# Patient Record
Sex: Female | Born: 1935
Health system: Southern US, Community
[De-identification: ages and names within clinical notes are randomized; demographics above are authoritative.]

## PROBLEM LIST (undated history)

## (undated) DIAGNOSIS — R011 Cardiac murmur, unspecified: Secondary | ICD-10-CM

## (undated) DIAGNOSIS — Z8619 Personal history of other infectious and parasitic diseases: Secondary | ICD-10-CM

## (undated) DIAGNOSIS — Z8612 Personal history of poliomyelitis: Secondary | ICD-10-CM

## (undated) DIAGNOSIS — E119 Type 2 diabetes mellitus without complications: Secondary | ICD-10-CM

## (undated) DIAGNOSIS — Z9710 Presence of artificial limb (complete) (partial), unspecified: Secondary | ICD-10-CM

## (undated) DIAGNOSIS — I10 Essential (primary) hypertension: Secondary | ICD-10-CM

## (undated) DIAGNOSIS — J449 Chronic obstructive pulmonary disease, unspecified: Secondary | ICD-10-CM

## (undated) DIAGNOSIS — H269 Unspecified cataract: Secondary | ICD-10-CM

## (undated) DIAGNOSIS — Z972 Presence of dental prosthetic device (complete) (partial): Secondary | ICD-10-CM

## (undated) DIAGNOSIS — B029 Zoster without complications: Secondary | ICD-10-CM

## (undated) DIAGNOSIS — M1990 Unspecified osteoarthritis, unspecified site: Secondary | ICD-10-CM

## (undated) DIAGNOSIS — T7840XA Allergy, unspecified, initial encounter: Secondary | ICD-10-CM

## (undated) DIAGNOSIS — E785 Hyperlipidemia, unspecified: Secondary | ICD-10-CM

## (undated) DIAGNOSIS — E039 Hypothyroidism, unspecified: Secondary | ICD-10-CM

## (undated) DIAGNOSIS — R42 Dizziness and giddiness: Secondary | ICD-10-CM

## (undated) HISTORY — PX: CARPAL TUNNEL RELEASE: SHX101

## (undated) HISTORY — DX: Hypothyroidism, unspecified: E03.9

## (undated) HISTORY — DX: Zoster without complications: B02.9

## (undated) HISTORY — DX: Unspecified cataract: H26.9

## (undated) HISTORY — DX: Type 2 diabetes mellitus without complications: E11.9

## (undated) HISTORY — PX: ELBOW SURGERY: SHX618

## (undated) HISTORY — DX: Personal history of other infectious and parasitic diseases: Z86.19

## (undated) HISTORY — PX: APPENDECTOMY: SHX54

## (undated) HISTORY — DX: Hyperlipidemia, unspecified: E78.5

## (undated) HISTORY — DX: Allergy, unspecified, initial encounter: T78.40XA

## (undated) HISTORY — DX: Essential (primary) hypertension: I10

## (undated) HISTORY — DX: Unspecified osteoarthritis, unspecified site: M19.90

---

## 1961-07-08 HISTORY — PX: ABDOMINAL HYSTERECTOMY: SHX81

## 1973-07-08 HISTORY — PX: LEG AMPUTATION BELOW KNEE: SHX694

## 2003-10-07 HISTORY — PX: CHOLECYSTECTOMY: SHX55

## 2003-10-14 ENCOUNTER — Other Ambulatory Visit: Payer: Self-pay

## 2005-06-20 ENCOUNTER — Ambulatory Visit: Payer: Self-pay | Admitting: Family Medicine

## 2005-09-09 ENCOUNTER — Ambulatory Visit: Payer: Self-pay | Admitting: Unknown Physician Specialty

## 2005-10-14 ENCOUNTER — Ambulatory Visit: Payer: Self-pay | Admitting: Family Medicine

## 2006-10-06 ENCOUNTER — Ambulatory Visit: Payer: Self-pay | Admitting: Orthopedic Surgery

## 2006-10-10 ENCOUNTER — Ambulatory Visit: Payer: Self-pay | Admitting: Family Medicine

## 2007-02-17 ENCOUNTER — Ambulatory Visit: Payer: Self-pay | Admitting: Orthopedic Surgery

## 2007-05-21 ENCOUNTER — Ambulatory Visit: Payer: Self-pay | Admitting: Anesthesiology

## 2007-06-22 ENCOUNTER — Ambulatory Visit: Payer: Self-pay | Admitting: Anesthesiology

## 2007-07-16 ENCOUNTER — Ambulatory Visit: Payer: Self-pay | Admitting: Family Medicine

## 2007-07-30 ENCOUNTER — Ambulatory Visit: Payer: Self-pay | Admitting: Anesthesiology

## 2007-09-02 ENCOUNTER — Ambulatory Visit: Payer: Self-pay | Admitting: Unknown Physician Specialty

## 2007-09-03 ENCOUNTER — Ambulatory Visit: Payer: Self-pay | Admitting: Anesthesiology

## 2007-10-19 ENCOUNTER — Ambulatory Visit: Payer: Self-pay | Admitting: Unknown Physician Specialty

## 2007-12-17 ENCOUNTER — Ambulatory Visit: Payer: Self-pay | Admitting: Family Medicine

## 2008-04-07 ENCOUNTER — Ambulatory Visit: Payer: Self-pay | Admitting: Orthopedic Surgery

## 2008-04-14 ENCOUNTER — Inpatient Hospital Stay: Payer: Self-pay | Admitting: Orthopedic Surgery

## 2008-07-13 ENCOUNTER — Ambulatory Visit: Payer: Self-pay | Admitting: Family Medicine

## 2009-07-05 ENCOUNTER — Encounter: Admission: RE | Admit: 2009-07-05 | Discharge: 2009-07-05 | Payer: Self-pay | Admitting: Orthopedic Surgery

## 2010-01-05 HISTORY — PX: HAND SURGERY: SHX662

## 2010-10-17 ENCOUNTER — Ambulatory Visit: Payer: Self-pay | Admitting: Family Medicine

## 2011-09-02 DIAGNOSIS — M810 Age-related osteoporosis without current pathological fracture: Secondary | ICD-10-CM | POA: Insufficient documentation

## 2012-09-03 ENCOUNTER — Ambulatory Visit: Payer: Self-pay | Admitting: Family Medicine

## 2012-09-03 LAB — HM MAMMOGRAPHY

## 2012-10-22 ENCOUNTER — Ambulatory Visit: Payer: Self-pay | Admitting: Unknown Physician Specialty

## 2013-08-26 LAB — CBC AND DIFFERENTIAL
HCT: 39 % (ref 36–46)
Hemoglobin: 13.4 g/dL (ref 12.0–16.0)
Platelets: 344 10*3/uL (ref 150–399)
WBC: 8.2 10^3/mL

## 2013-12-16 DIAGNOSIS — J449 Chronic obstructive pulmonary disease, unspecified: Secondary | ICD-10-CM | POA: Insufficient documentation

## 2014-02-10 ENCOUNTER — Ambulatory Visit: Payer: Self-pay | Admitting: Family Medicine

## 2014-07-14 DIAGNOSIS — M65342 Trigger finger, left ring finger: Secondary | ICD-10-CM | POA: Diagnosis not present

## 2014-09-01 DIAGNOSIS — N183 Chronic kidney disease, stage 3 (moderate): Secondary | ICD-10-CM | POA: Diagnosis not present

## 2014-09-01 DIAGNOSIS — I10 Essential (primary) hypertension: Secondary | ICD-10-CM | POA: Diagnosis not present

## 2014-09-01 DIAGNOSIS — E039 Hypothyroidism, unspecified: Secondary | ICD-10-CM | POA: Diagnosis not present

## 2014-09-01 DIAGNOSIS — E785 Hyperlipidemia, unspecified: Secondary | ICD-10-CM | POA: Diagnosis not present

## 2014-09-01 DIAGNOSIS — E559 Vitamin D deficiency, unspecified: Secondary | ICD-10-CM | POA: Diagnosis not present

## 2014-09-01 DIAGNOSIS — E1129 Type 2 diabetes mellitus with other diabetic kidney complication: Secondary | ICD-10-CM | POA: Diagnosis not present

## 2014-09-01 LAB — BASIC METABOLIC PANEL
BUN: 21 mg/dL (ref 4–21)
Creatinine: 1.2 mg/dL — AB (ref 0.5–1.1)
Glucose: 129 mg/dL
Potassium: 3.6 mmol/L (ref 3.4–5.3)
Sodium: 141 mmol/L (ref 137–147)

## 2014-09-01 LAB — TSH: TSH: 1.39 u[IU]/mL (ref 0.41–5.90)

## 2014-09-01 LAB — HEMOGLOBIN A1C: Hgb A1c MFr Bld: 6.5 % — AB (ref 4.0–6.0)

## 2014-09-01 LAB — LIPID PANEL
Cholesterol: 176 mg/dL (ref 0–200)
HDL: 61 mg/dL (ref 35–70)
LDL Cholesterol: 72 mg/dL
Triglycerides: 217 mg/dL — AB (ref 40–160)

## 2014-09-12 DIAGNOSIS — M1990 Unspecified osteoarthritis, unspecified site: Secondary | ICD-10-CM | POA: Diagnosis not present

## 2014-10-20 DIAGNOSIS — J449 Chronic obstructive pulmonary disease, unspecified: Secondary | ICD-10-CM | POA: Diagnosis not present

## 2014-10-20 DIAGNOSIS — J310 Chronic rhinitis: Secondary | ICD-10-CM | POA: Diagnosis not present

## 2014-10-20 DIAGNOSIS — E6609 Other obesity due to excess calories: Secondary | ICD-10-CM | POA: Diagnosis not present

## 2014-10-20 DIAGNOSIS — R0609 Other forms of dyspnea: Secondary | ICD-10-CM | POA: Diagnosis not present

## 2014-10-20 DIAGNOSIS — R0602 Shortness of breath: Secondary | ICD-10-CM | POA: Diagnosis not present

## 2014-11-03 DIAGNOSIS — M7551 Bursitis of right shoulder: Secondary | ICD-10-CM | POA: Diagnosis not present

## 2014-11-24 DIAGNOSIS — E785 Hyperlipidemia, unspecified: Secondary | ICD-10-CM | POA: Insufficient documentation

## 2014-11-24 DIAGNOSIS — I10 Essential (primary) hypertension: Secondary | ICD-10-CM | POA: Insufficient documentation

## 2014-11-24 DIAGNOSIS — E559 Vitamin D deficiency, unspecified: Secondary | ICD-10-CM | POA: Insufficient documentation

## 2014-11-24 DIAGNOSIS — L309 Dermatitis, unspecified: Secondary | ICD-10-CM | POA: Insufficient documentation

## 2014-11-24 DIAGNOSIS — E1129 Type 2 diabetes mellitus with other diabetic kidney complication: Secondary | ICD-10-CM | POA: Insufficient documentation

## 2014-11-24 DIAGNOSIS — J984 Other disorders of lung: Secondary | ICD-10-CM | POA: Insufficient documentation

## 2014-11-24 DIAGNOSIS — N183 Chronic kidney disease, stage 3 unspecified: Secondary | ICD-10-CM | POA: Insufficient documentation

## 2014-11-24 DIAGNOSIS — M1990 Unspecified osteoarthritis, unspecified site: Secondary | ICD-10-CM | POA: Insufficient documentation

## 2014-11-24 DIAGNOSIS — Z7409 Other reduced mobility: Secondary | ICD-10-CM | POA: Insufficient documentation

## 2014-11-24 DIAGNOSIS — Q6689 Other  specified congenital deformities of feet: Secondary | ICD-10-CM | POA: Insufficient documentation

## 2014-11-24 DIAGNOSIS — Z89519 Acquired absence of unspecified leg below knee: Secondary | ICD-10-CM | POA: Insufficient documentation

## 2014-11-24 DIAGNOSIS — G2581 Restless legs syndrome: Secondary | ICD-10-CM | POA: Insufficient documentation

## 2014-11-24 DIAGNOSIS — Z8612 Personal history of poliomyelitis: Secondary | ICD-10-CM | POA: Insufficient documentation

## 2014-11-24 DIAGNOSIS — M419 Scoliosis, unspecified: Secondary | ICD-10-CM | POA: Insufficient documentation

## 2014-11-24 DIAGNOSIS — M25559 Pain in unspecified hip: Secondary | ICD-10-CM | POA: Insufficient documentation

## 2014-11-24 DIAGNOSIS — K589 Irritable bowel syndrome without diarrhea: Secondary | ICD-10-CM | POA: Insufficient documentation

## 2014-11-24 DIAGNOSIS — K219 Gastro-esophageal reflux disease without esophagitis: Secondary | ICD-10-CM | POA: Insufficient documentation

## 2014-11-24 DIAGNOSIS — E039 Hypothyroidism, unspecified: Secondary | ICD-10-CM | POA: Insufficient documentation

## 2014-11-24 HISTORY — DX: Personal history of poliomyelitis: Z86.12

## 2014-11-28 DIAGNOSIS — H2513 Age-related nuclear cataract, bilateral: Secondary | ICD-10-CM | POA: Diagnosis not present

## 2014-12-19 DIAGNOSIS — J449 Chronic obstructive pulmonary disease, unspecified: Secondary | ICD-10-CM | POA: Diagnosis not present

## 2015-01-02 ENCOUNTER — Encounter: Payer: Self-pay | Admitting: Family Medicine

## 2015-01-02 ENCOUNTER — Ambulatory Visit (INDEPENDENT_AMBULATORY_CARE_PROVIDER_SITE_OTHER): Payer: Commercial Managed Care - HMO | Admitting: Family Medicine

## 2015-01-02 VITALS — BP 108/62 | HR 62 | Temp 97.6°F | Resp 16 | Ht 69.0 in | Wt 159.0 lb

## 2015-01-02 DIAGNOSIS — Z0000 Encounter for general adult medical examination without abnormal findings: Secondary | ICD-10-CM | POA: Diagnosis not present

## 2015-01-02 DIAGNOSIS — E039 Hypothyroidism, unspecified: Secondary | ICD-10-CM | POA: Diagnosis not present

## 2015-01-02 DIAGNOSIS — E785 Hyperlipidemia, unspecified: Secondary | ICD-10-CM | POA: Diagnosis not present

## 2015-01-02 DIAGNOSIS — M129 Arthropathy, unspecified: Secondary | ICD-10-CM

## 2015-01-02 DIAGNOSIS — M810 Age-related osteoporosis without current pathological fracture: Secondary | ICD-10-CM | POA: Diagnosis not present

## 2015-01-02 DIAGNOSIS — E1129 Type 2 diabetes mellitus with other diabetic kidney complication: Secondary | ICD-10-CM | POA: Diagnosis not present

## 2015-01-02 DIAGNOSIS — J449 Chronic obstructive pulmonary disease, unspecified: Secondary | ICD-10-CM

## 2015-01-02 DIAGNOSIS — N183 Chronic kidney disease, stage 3 unspecified: Secondary | ICD-10-CM

## 2015-01-02 DIAGNOSIS — K219 Gastro-esophageal reflux disease without esophagitis: Secondary | ICD-10-CM

## 2015-01-02 DIAGNOSIS — M19011 Primary osteoarthritis, right shoulder: Secondary | ICD-10-CM

## 2015-01-02 DIAGNOSIS — I10 Essential (primary) hypertension: Secondary | ICD-10-CM

## 2015-01-02 DIAGNOSIS — E559 Vitamin D deficiency, unspecified: Secondary | ICD-10-CM | POA: Diagnosis not present

## 2015-01-02 MED ORDER — GLUCOSE BLOOD VI STRP: ORAL_STRIP | Status: DC

## 2015-01-02 NOTE — Progress Notes (Addendum)
Patient: Kayla Boyle, Female    DOB: May 21, 1935, 80 y.o.   MRN: [id number] Visit Date: 01/02/2015  Today's Provider: Mila Merry, MD   Chief Complaint  Patient presents with  . Annual Exam   Subjective:    Annual wellness visit Kayla Boyle is a 80 y.o. female who presents today for her Subsequent Annual Wellness Visit. She feels fairly well. She reports exercising none. She reports she is sleeping fairly well.  ----------------------------------------------------------- Pt reports that she thinks her thyroid may be off, because she reporting taking 2 of her Levothyroxine one day and her appetite was decrease and when she takes one she feels like she eats all day long.    Diabetes Mellitus Type II, Follow-up:   Lab Results  Component Value Date   HGBA1C 6.5* 09/01/2014   Last seen for diabetes 3 months ago.  Management changes included none. She reports good compliance with treatment. She is not having side effects.  Home blood sugar records: fasting range: 120s-140s  Episodes of hypoglycemia? no    ------------------------------------------------------------------------   Hypertension, follow-up:  BP Readings from Last 3 Encounters:  01/02/15 108/62  09/01/14 114/60    She was last seen for hypertension 3 months ago.  BP at that visit was 114/60 . Management changes since that visit include none. She reports good compliance with treatment. She is not having side effects.  She is not exercising. She is adherent to low salt diet.   Outside blood pressures are normal.  ------------------------------------------------------------------------    Lipid/Cholesterol, Follow-up:   Last seen for this 3 months ago.  Management changes since that visit include none.  Last Lipid Panel:    Component Value Date/Time   CHOL 176 09/01/2014   TRIG 217* 09/01/2014   HDL 61 09/01/2014   LDLCALC 72 09/01/2014    She reports good compliance with  treatment. She is not having side effects.   Wt Readings from Last 3 Encounters:  01/02/15 159 lb (72.122 kg)  09/01/14 162 lb (73.483 kg)    ------------------------------------------------------------------------    GERD, Follow up:  She is having heartburn every day even on Protonix and ranitidine.   She reports good compliance with treatment. She is not having side effects. .  She IS experiencing difficulty swallowing and heartburn. She is NOT experiencing abdominal bloating, choking on food, hematemesis, hoarseness, midespigastric pain, nausea or shortness of breath  ------------------------------------------------------------------------     Review of Systems  Constitutional: Negative.   HENT: Positive for sinus pressure, sneezing and trouble swallowing.   Eyes: Negative.   Respiratory: Positive for shortness of breath and wheezing.   Cardiovascular: Negative.   Gastrointestinal: Positive for diarrhea.  Endocrine: Negative.   Genitourinary: Negative.   Musculoskeletal: Positive for back pain.  Skin: Negative.   Allergic/Immunologic: Negative.   Neurological: Negative.   Hematological: Negative.   Psychiatric/Behavioral: Negative.     History   Social History  . Marital Status: Single    Spouse Name: N/A  . Number of Children: N/A  . Years of Education: 12   Occupational History  . Disabled   . retired    Social History Main Topics  . Smoking status: Former Smoker    Types: Cigarettes    Quit date: 10/06/2001  . Smokeless tobacco: Not on file  . Alcohol Use: No  . Drug Use: No  . Sexual Activity: Not on file   Other Topics Concern  . Not on file   Social History  Narrative    Patient Active Problem List   Diagnosis Date Noted  . Arthritis 11/24/2014  . Chronic kidney disease (CKD), stage III (moderate) 11/24/2014  . Club foot 11/24/2014  . Diabetes mellitus with renal complications 11/24/2014  . Eczema of hand 11/24/2014  . GERD  (gastroesophageal reflux disease) 11/24/2014  . Arthralgia of hip 11/24/2014  . H/O acute poliomyelitis 11/24/2014  . Hyperlipidemia 11/24/2014  . Hypertension 11/24/2014  . Hypomagnesemia 11/24/2014  . Hypothyroid 11/24/2014  . IBS (irritable bowel syndrome) 11/24/2014  . Mobility impaired 11/24/2014  . Restless leg 11/24/2014  . Restrictive lung disease 11/24/2014  . Status post below knee amputation 11/24/2014  . Scoliosis 11/24/2014  . Vitamin D deficiency 11/24/2014  . COPD, moderate 12/16/2013  . OP (osteoporosis) 09/02/2011    Past Surgical History  Procedure Laterality Date  . Cholecystectomy  10/2003    laproscopic  . Leg amputation below knee Right 1975    traumatic; bleow knee and club foot  . Abdominal hysterectomy  1963  . Appendectomy    . Elbow surgery Bilateral   . Hand surgery  01/2010  . Carpal tunnel release      Her family history includes Cancer in her son; Cancer (age of onset: 7) in her other; Diabetes in her brother; HIV/AIDS in her son; Hepatitis C in her daughter; Pancreatic cancer in her father; Prostate cancer in her brother.    Previous Medications   ALBUTEROL (PROVENTIL HFA;VENTOLIN HFA) 108 (90 BASE) MCG/ACT INHALER    Inhale 2 puffs into the lungs every 4 (four) hours as needed.   ALENDRONATE (FOSAMAX) 70 MG TABLET    Take 1 tablet by mouth once a week. 30 minutes before first food, beverage   ASPIRIN 81 MG TABLET    Take 1 tablet by mouth daily.   ATENOLOL-CHLORTHALIDONE (TENORETIC) 50-25 MG PER TABLET    Take 1 tablet by mouth daily.   FLUTICASONE (FLONASE) 50 MCG/ACT NASAL SPRAY    Place 2 sprays into both nostrils daily.   GLUCOSE BLOOD TEST STRIP    1 strip daily. Check blood sugar once daily   HYDROCODONE-ACETAMINOPHEN (NORCO/VICODIN) 5-325 MG PER TABLET    Take 1 tablet by mouth every 6 (six) hours as needed.   LEVOTHYROXINE (SYNTHROID, LEVOTHROID) 75 MCG TABLET    Take 1 tablet by mouth daily.   LOVASTATIN (MEVACOR) 40 MG TABLET     Take 1 tablet by mouth at bedtime.   MAGNESIUM OXIDE 500 MG TABS    Take 1 tablet by mouth daily.   METFORMIN (GLUCOPHAGE-XR) 500 MG 24 HR TABLET    Take 1 tablet by mouth daily.   MONTELUKAST (SINGULAIR) 10 MG TABLET    Take 1 tablet by mouth daily.   PANTOPRAZOLE (PROTONIX) 40 MG TABLET    Take 1 tablet by mouth daily.   RANITIDINE (ZANTAC) 300 MG TABLET    Take 1 tablet by mouth daily as needed.   TRIAMCINOLONE LOTION (KENALOG) 0.1 %    Apply 1 application topically 2 (two) times daily.   VITAMIN D, CHOLECALCIFEROL, 400 UNITS CHEW    Chew 1 tablet by mouth daily.    Patient Care Team: Malva Limes, MD as PCP - General (Family Medicine) Mertie Moores, MD as Referring Physician (Pulmonary Disease) Kandyce Rud., MD as Consulting Physician (Rheumatology) Scot Jun, MD as Consulting Physician (Gastroenterology) Ollen Gross, MD as Consulting Physician (Orthopedic Surgery)     Objective:   Vitals: BP 108/62 mmHg  Pulse 62  Temp(Src) 97.6 F (36.4 C) (Oral)  Resp 16  Ht 5\' 9"  (1.753 m)  Wt 159 lb (72.122 kg)  BMI 23.47 kg/m2  Physical Exam   General Appearance:    Alert, cooperative, no distress, appears stated age, obese  Head:    Normocephalic, without obvious abnormality, atraumatic  Eyes:    PERRL, conjunctiva/corneas clear, EOM's intact, fundi    benign, both eyes  Ears:    Normal TM's and external ear canals, both ears  Nose:   Nares normal, septum midline, mucosa normal, no drainage    or sinus tenderness  Throat:   Lips, mucosa, and tongue normal; teeth and gums normal  Neck:   Supple, symmetrical, trachea midline, no adenopathy;    thyroid:  no enlargement/tenderness/nodules; no carotid   bruit or JVD  Back:     Symmetric, no curvature, ROM normal, no CVA tenderness  Lungs:     Clear to auscultation bilaterally, respirations unlabored  Chest Wall:    No tenderness or deformity   Heart:    Regular rate and rhythm, S1 and S2 normal, no murmur,  rub   or gallop  Breast Exam:    deferred  Abdomen:     Soft, non-tender, bowel sounds active all four quadrants,    no masses, no organomegaly  Pevic:    deferred  Extremities:   Extremities normal, atraumatic, no cyanosis or edema. Status post right below knee amputation with prosthetic right lower leg.   Pulses:   2+ and symmetric all extremities  Skin:   Skin color, texture, turgor normal, no rashes or lesions  Lymph nodes:   Cervical, supraclavicular, and axillary nodes normal  Neurologic:   CNII-XII intact, normal strength, sensation and reflexes    throughout     Activities of Daily Living In your present state of health, do you have any difficulty performing the following activities: 01/02/2015 01/02/2015  Hearing? N N  Vision? N N  Difficulty concentrating or making decisions? N N  Walking or climbing stairs? Y Y  Dressing or bathing? N N  Doing errands, shopping? N N    Fall Risk Assessment Fall Risk  01/02/2015  Falls in the past year? No     Depression Screen PHQ 2/9 Scores 01/02/2015  PHQ - 2 Score 0    Cognitive Testing - 6-CIT  Correct? Score   What year is it? yes 0 0 or 4  What month is it? yes 0 0 or 3  Memorize:    Floyde Parkins[place],  [place],  44,  [address],  [place],      What time is it? (within 1 hour) yes 0 0 or 3  Count backwards from 20 yes 0 0, 2, or 4  Name the months of the year yes 0 0, 2, or 4  Repeat name & address above yes 0 0, 2, 4, 6, 8, or 10       TOTAL SCORE  0/28   Interpretation:  Normal  Normal (0-7) Abnormal (8-28)   Audit-C Alcohol Use Screening  Question Answer Points  How often do you have alcoholic drink? never 0  How many drinks do you typically consume in a day? 0 0  How oftey will you drink 6 or more in a total? never 0  Total Score:  0   A score of 3 or more in women, and 4 or more in men indicates increased risk for alcohol abuse, EXCEPT if all of the points are  from question 1.      Assessment & Plan:     Annual  Wellness Visit  Reviewed patient's Family Medical History Reviewed and updated list of patient's medical providers Assessment of cognitive impairment was done Assessed patient's functional ability Established a written schedule for health screening services Health Risk Assessent Completed and Reviewed  Exercise Activities and Dietary recommendations Goals    None      Immunization History  Administered Date(s) Administered  . Pneumococcal Conjugate-13 08/26/2013  . Pneumococcal Polysaccharide-23 06/13/2005  . Td 02/24/2008  . Tdap 03/26/2011  . Zoster 07/19/2010    Health Maintenance  Topic Date Due  . FOOT EXAM  07/12/1946  . OPHTHALMOLOGY EXAM  07/12/1946  . URINE MICROALBUMIN  07/12/1946  . COLONOSCOPY  07/12/1986  . INFLUENZA VACCINE  02/06/2015  . HEMOGLOBIN A1C  03/02/2015  . TETANUS/TDAP  03/25/2021  . DEXA SCAN  Completed  . ZOSTAVAX  Completed  . PNA vac Low Risk Adult  Completed      Discussed health benefits of physical activity, and encouraged her to engage in regular exercise appropriate for her age and condition.    ------------------------------------------------------------------------------------------------------------ 1. Annual physical exam   2. COPD, moderate Stable continue routine follow up Q92months Dr. Meredeth Ide  3. Chronic kidney disease (CKD), stage III (moderate) Check renal panel. Consider adding ACEI or ARB.   4. Type 2 diabetes mellitus with other diabetic kidney complication Doing well current medications.  - Hemoglobin A1c  5. Gastroesophageal reflux disease, esophagitis presence not specified Has been having more reflux sx lately I suggested she see her GI. She states it has just been bothering more the last few weeks. She is going to see if she can get it under control with dietary changes and call back if not.   6. Hyperlipidemia She is tolerating lovastatin well with no adverse effects.   Time to check lipids.  - Lipid  panel - Hepatic function panel  7. Essential hypertension BP has been running low. May be able to get by without hctz. Check renal first.  - Renal function panel - EKG 12-Lead  8. Hypomagnesemia Levels normal when checked in February  9. OP (osteoporosis) Continue alendronate. BMD next year  10. Vitamin D deficiency Continue current supplementation.   11. Hypothyroidism, unspecified hypothyroidism type  - TSH  12. Arthritis of shoulder region, right  Continue folllow up Dr. Katrinka Blazing injections of right shoulder.   13. S/p Right below knee amputation with prosthetic right lower leg.

## 2015-01-03 LAB — LIPID PANEL
Chol/HDL Ratio: 2.9 ratio units (ref 0.0–4.4)
Cholesterol, Total: 171 mg/dL (ref 100–199)
HDL: 58 mg/dL (ref >39)
LDL Calculated: 79 mg/dL (ref 0–99)
Triglycerides: 168 mg/dL — ABNORMAL HIGH (ref 0–149)
VLDL Cholesterol Cal: 34 mg/dL (ref 5–40)

## 2015-01-03 LAB — RENAL FUNCTION PANEL
Albumin: 4.2 g/dL (ref 3.5–4.8)
BUN/Creatinine Ratio: 19 (ref 11–26)
BUN: 21 mg/dL (ref 8–27)
CO2: 25 mmol/L (ref 18–29)
Calcium: 9.3 mg/dL (ref 8.7–10.3)
Chloride: 95 mmol/L — ABNORMAL LOW (ref 97–108)
Creatinine, Ser: 1.1 mg/dL — ABNORMAL HIGH (ref 0.57–1.00)
GFR calc Af Amer: 56 mL/min/{1.73_m2} — ABNORMAL LOW (ref >59)
GFR calc non Af Amer: 48 mL/min/{1.73_m2} — ABNORMAL LOW (ref >59)
Glucose: 109 mg/dL — ABNORMAL HIGH (ref 65–99)
Phosphorus: 2.7 mg/dL (ref 2.5–4.5)
Potassium: 3.5 mmol/L (ref 3.5–5.2)
Sodium: 138 mmol/L (ref 134–144)

## 2015-01-03 LAB — HEPATIC FUNCTION PANEL
ALT: 18 IU/L (ref 0–32)
AST: 22 IU/L (ref 0–40)
Alkaline Phosphatase: 65 IU/L (ref 39–117)
Bilirubin Total: 0.6 mg/dL (ref 0.0–1.2)
Bilirubin, Direct: 0.16 mg/dL (ref 0.00–0.40)
Total Protein: 6.6 g/dL (ref 6.0–8.5)

## 2015-01-03 LAB — HEMOGLOBIN A1C
Est. average glucose Bld gHb Est-mCnc: 134 mg/dL
Hgb A1c MFr Bld: 6.3 % — ABNORMAL HIGH (ref 4.8–5.6)

## 2015-01-03 LAB — TSH: TSH: 0.491 u[IU]/mL (ref 0.450–4.500)

## 2015-01-03 MED ORDER — LOSARTAN POTASSIUM 50 MG PO TABS: 50.0000 mg | ORAL_TABLET | Freq: Every day | ORAL | Status: DC

## 2015-01-03 NOTE — Addendum Note (Signed)
Addended by: Cyndia BentBYRD, Mikele Sifuentes M on: 01/03/2015 09:26 AM   Modules accepted: Orders

## 2015-01-12 DIAGNOSIS — M65342 Trigger finger, left ring finger: Secondary | ICD-10-CM | POA: Diagnosis not present

## 2015-01-12 DIAGNOSIS — M7551 Bursitis of right shoulder: Secondary | ICD-10-CM | POA: Diagnosis not present

## 2015-01-13 ENCOUNTER — Telehealth: Payer: Self-pay | Admitting: Family Medicine

## 2015-01-26 ENCOUNTER — Other Ambulatory Visit: Payer: Self-pay | Admitting: Family Medicine

## 2015-01-26 DIAGNOSIS — M65342 Trigger finger, left ring finger: Secondary | ICD-10-CM | POA: Diagnosis not present

## 2015-02-09 ENCOUNTER — Ambulatory Visit (INDEPENDENT_AMBULATORY_CARE_PROVIDER_SITE_OTHER): Payer: Commercial Managed Care - HMO | Admitting: Family Medicine

## 2015-02-09 ENCOUNTER — Encounter: Payer: Self-pay | Admitting: Family Medicine

## 2015-02-09 VITALS — BP 100/58 | HR 70 | Temp 98.0°F | Resp 16

## 2015-02-09 DIAGNOSIS — N183 Chronic kidney disease, stage 3 unspecified: Secondary | ICD-10-CM

## 2015-02-09 DIAGNOSIS — I10 Essential (primary) hypertension: Secondary | ICD-10-CM

## 2015-02-09 DIAGNOSIS — J309 Allergic rhinitis, unspecified: Secondary | ICD-10-CM | POA: Diagnosis not present

## 2015-02-09 MED ORDER — LISINOPRIL 2.5 MG PO TABS: 2.5000 mg | ORAL_TABLET | Freq: Every day | ORAL | Status: DC

## 2015-02-09 MED ORDER — FLUTICASONE PROPIONATE 50 MCG/ACT NA SUSP: 2.0000 | Freq: Every day | NASAL | Status: DC

## 2015-02-09 MED ORDER — ATENOLOL-CHLORTHALIDONE 50-25 MG PO TABS: ORAL_TABLET | ORAL | Status: DC

## 2015-02-09 NOTE — Patient Instructions (Signed)
Reduce atenolol-chlorthalidone to 1/2 tablet daily until next office visit Start lisinopril once a day.

## 2015-02-09 NOTE — Progress Notes (Signed)
Patient: Kayla Boyle Female    DOB: [birth year]   80 y.o.   MRN: [id number] Visit Date: 02/09/2015  Today's Provider: Mila Merry, MD   Chief Complaint  Patient presents with  . Follow-up  . Hypertension   Subjective:    HPI      Hypertension, follow-up:  BP Readings from Last 3 Encounters:  02/09/15 100/58  01/02/15 108/62  09/01/14 114/60    She was last seen for hypertension 1 months ago.  BP at that visit was 108/62. Management changes since that visit include; reduce atenolol-chlorthalidone down to 1/2 tablet daily, and start Losartan 50mg  one tablet daily due to diabetes and CKD.  She stopped taking losartan because she thought it made her feel bloated, and has gone back up to a full tablet of atenolol-chlorthalidone daily.     Weight trend: stable Wt Readings from Last 3 Encounters:  01/02/15 159 lb (72.122 kg)  09/01/14 162 lb (73.483 kg)     ------------------------------------------------------------------------  Allergies She states her allergies have been much worse and she requests refill for Flonase which has worked well in the pst.   Allergies  Allergen Reactions  . Bee Venom Hives  . Codeine Rash and Swelling  . Penicillins Rash and Swelling   Previous Medications   ALBUTEROL (PROVENTIL HFA;VENTOLIN HFA) 108 (90 BASE) MCG/ACT INHALER    Inhale 2 puffs into the lungs every 4 (four) hours as needed.   ALENDRONATE (FOSAMAX) 70 MG TABLET    Take 1 tablet by mouth once a week. 30 minutes before first food, beverage   ASPIRIN 81 MG TABLET    Take 1 tablet by mouth daily.   ATENOLOL-CHLORTHALIDONE (TENORETIC) 50-25 MG PER TABLET    TAKE 1/2 TABLET BY MOUTH EACH DAY.   FLUTICASONE (FLONASE) 50 MCG/ACT NASAL SPRAY    Place 2 sprays into both nostrils daily.   GLUCOSE BLOOD (ACCU-CHEK AVIVA PLUS) TEST STRIP    Use to check blood sugar once a day   HYDROCODONE-ACETAMINOPHEN (NORCO/VICODIN) 5-325 MG PER TABLET    Take 1 tablet by mouth every 6  (six) hours as needed.   LEVOTHYROXINE (SYNTHROID, LEVOTHROID) 75 MCG TABLET    Take 1 tablet by mouth daily.   LOSARTAN (COZAAR) 50 MG TABLET   Not taking   LOVASTATIN (MEVACOR) 40 MG TABLET    Take 1 tablet by mouth at bedtime.   MAGNESIUM OXIDE 500 MG TABS    Take 1 tablet by mouth daily.   METFORMIN (GLUCOPHAGE-XR) 500 MG 24 HR TABLET    Take 1 tablet by mouth daily.   MONTELUKAST (SINGULAIR) 10 MG TABLET    Take 1 tablet by mouth daily.   PANTOPRAZOLE (PROTONIX) 40 MG TABLET    TAKE ONE TABLET BY MOUTH EVERY DAY   RANITIDINE (ZANTAC) 300 MG TABLET    Take 1 tablet by mouth daily as needed.   TRIAMCINOLONE LOTION (KENALOG) 0.1 %    Apply 1 application topically 2 (two) times daily.   VITAMIN D, CHOLECALCIFEROL, 400 UNITS CHEW    Chew 1 tablet by mouth daily.    Review of Systems  Constitutional: Positive for appetite change.  Cardiovascular: Negative for chest pain and palpitations.  Musculoskeletal: Positive for gait problem.  Neurological: Negative for dizziness and light-headedness.    History  Substance Use Topics  . Smoking status: Former Smoker    Types: Cigarettes    Quit date: 10/06/2001  . Smokeless tobacco: Not on file  .  Alcohol Use: No   Objective:   BP 100/58 mmHg  Pulse 70  Temp(Src) 98 F (36.7 C) (Oral)  Resp 16  SpO2 93%  Physical Exam  General Appearance:    Alert, cooperative, no distress  Eyes:    PERRL, conjunctiva/corneas clear, EOM's intact       Lungs:     Clear to auscultation bilaterally, respirations unlabored  Heart:    Regular rate and rhythm  Neurologic:   Awake, alert, oriented x 3. No apparent focal neurological           defect.            Assessment & Plan:     1. Allergic rhinitis, unspecified allergic rhinitis type Restart nasal steroid - fluticasone (FLONASE) 50 MCG/ACT nasal spray; Place 2 sprays into both nostrils daily.  Dispense: 16 g; Refill: 4  2. Essential hypertension Transition her to ACEI since she doesn't think  she tolerated losartin.  - lisinopril (PRINIVIL,ZESTRIL) 2.5 MG tablet; Take 1 tablet (2.5 mg total) by mouth daily.  Dispense: 30 tablet; Refill: 1 - atenolol-chlorthalidone (TENORETIC) 50-25 MG per tablet; 1/2 tablet daily  Dispense: 1 tablet; Refill: 4  3. Chronic kidney disease (CKD), stage III (moderate) Push fluids. Recheck renal functions after starting ACEI,        Mila Merry, MD  [facility]

## 2015-02-10 ENCOUNTER — Telehealth: Payer: Self-pay | Admitting: Family Medicine

## 2015-02-10 NOTE — Telephone Encounter (Signed)
Richardson Dopp called saying Marcelino Duster was to fax a letter to her that Dr. Sherrie Mustache wrote.  The fax number is [phone].  Thanks Barth Kirks

## 2015-02-13 NOTE — Telephone Encounter (Signed)
Pt called to see if they letter was ready and if it had been faxed. Pt request that [place] call her back. Thanks TNP

## 2015-02-13 NOTE — Telephone Encounter (Signed)
Please advise letter pt requested?

## 2015-02-14 NOTE — Telephone Encounter (Signed)
Pt called back to speak with Marcelino Duster. Pt stated she needed to speak with Marcelino Duster before she leaves this afternoon. Pt stated that she needed the letter and she was trying to help [place] out by getting [place] the fax#. Thanks TNP

## 2015-02-15 NOTE — Addendum Note (Signed)
Addended by: Malva Limes on: 02/15/2015 08:08 AM   Modules accepted: Kipp Brood

## 2015-02-23 ENCOUNTER — Other Ambulatory Visit: Payer: Self-pay | Admitting: Family Medicine

## 2015-03-02 DIAGNOSIS — Z89511 Acquired absence of right leg below knee: Secondary | ICD-10-CM | POA: Diagnosis not present

## 2015-03-23 ENCOUNTER — Ambulatory Visit (INDEPENDENT_AMBULATORY_CARE_PROVIDER_SITE_OTHER): Payer: Commercial Managed Care - HMO | Admitting: Family Medicine

## 2015-03-23 ENCOUNTER — Other Ambulatory Visit: Payer: Self-pay | Admitting: Family Medicine

## 2015-03-23 ENCOUNTER — Encounter: Payer: Self-pay | Admitting: Family Medicine

## 2015-03-23 VITALS — BP 106/62 | HR 68 | Temp 97.8°F | Resp 16

## 2015-03-23 DIAGNOSIS — I10 Essential (primary) hypertension: Secondary | ICD-10-CM

## 2015-03-23 DIAGNOSIS — Z23 Encounter for immunization: Secondary | ICD-10-CM

## 2015-03-23 DIAGNOSIS — J309 Allergic rhinitis, unspecified: Secondary | ICD-10-CM | POA: Diagnosis not present

## 2015-03-23 DIAGNOSIS — R059 Cough, unspecified: Secondary | ICD-10-CM

## 2015-03-23 DIAGNOSIS — E1129 Type 2 diabetes mellitus with other diabetic kidney complication: Secondary | ICD-10-CM | POA: Diagnosis not present

## 2015-03-23 DIAGNOSIS — R05 Cough: Secondary | ICD-10-CM

## 2015-03-23 MED ORDER — LISINOPRIL 2.5 MG PO TABS: 2.5000 mg | ORAL_TABLET | Freq: Every day | ORAL | Status: DC

## 2015-03-23 NOTE — Progress Notes (Signed)
Patient: Kayla Boyle Female    DOB: Sep 20, 1934   80 y.o.   MRN: [id number] Visit Date: 03/23/2015  Today's Provider: Mila Merry, MD   Chief Complaint  Patient presents with  . Hypertension    follow up  . Cough    x 1 month   Subjective:    Cough Episode onset: 1 month. The problem has been gradually worsening. The cough is non-productive. Associated symptoms include rhinorrhea and wheezing. Pertinent negatives include no chest pain, chills, ear congestion, ear pain, fever, heartburn, hemoptysis, myalgias, nasal congestion, postnasal drip, rash, sore throat, shortness of breath or weight loss. The symptoms are aggravated by lying down (talking also). Treatments tried: Singulair and Flonase. The treatment provided no relief. Her past medical history is significant for COPD.     Hypertension, follow-up:  BP Readings from Last 3 Encounters:  03/23/15 106/62  02/09/15 100/58  01/02/15 108/62    She was last seen for hypertension 1 months ago.  BP at that visit was   100/58. Management since that visit includes starting Lisinopril 2.5mg  daily and weaning Atenolol-Chlorthalidone 50-25mg  to 1/2 tablet daily due to bloating from Losartan. She tolerating lisinopril well with no adverse effects and then stopped atenolol-chlorthalidone. She reports good compliance with treatment.  She is not having side effects. Patient states she ran out of Lisinopril over 2 week ago and has just been taking 1/2 tablet of Atenolol- Chlorthalidone.  She is not exercising. She is not adherent to low salt diet.   Outside blood pressures are not being checked. She is experiencing none.  Patient denies chest pain, chest pressure/discomfort, claudication, exertional chest pressure/discomfort, fatigue, irregular heart beat, lower extremity edema, near-syncope, palpitations, paroxysmal nocturnal dyspnea, syncope and tachypnea.   Cardiovascular risk factors include advanced age (older than 58 for men,  25 for women).  Use of agents associated with hypertension: NSAIDS.     Weight trend: stable Wt Readings from Last 3 Encounters:  01/02/15 159 lb (72.122 kg)  09/01/14 162 lb (73.483 kg)    Current diet: in general, a "healthy" diet    ------------------------------------------------------------------------      Allergies  Allergen Reactions  . Bee Venom Hives  . Codeine Rash and Swelling  . Penicillins Rash and Swelling   Previous Medications   ALBUTEROL (PROVENTIL HFA;VENTOLIN HFA) 108 (90 BASE) MCG/ACT INHALER    Inhale 2 puffs into the lungs every 4 (four) hours as needed.   ALENDRONATE (FOSAMAX) 70 MG TABLET    Take 1 tablet by mouth once a week. 30 minutes before first food, beverage   ASPIRIN 81 MG TABLET    Take 1 tablet by mouth daily.   ATENOLOL-CHLORTHALIDONE (TENORETIC) 50-25 MG PER TABLET    1/2 tablet daily   FLUTICASONE (FLONASE) 50 MCG/ACT NASAL SPRAY    Place 2 sprays into both nostrils daily.   GLUCOSE BLOOD (ACCU-CHEK AVIVA PLUS) TEST STRIP    Use to check blood sugar once a day   HYDROCODONE-ACETAMINOPHEN (NORCO/VICODIN) 5-325 MG PER TABLET    Take 1 tablet by mouth every 6 (six) hours as needed.   LEVOTHYROXINE (SYNTHROID, LEVOTHROID) 75 MCG TABLET    Take 1 tablet by mouth daily.   LISINOPRIL (PRINIVIL,ZESTRIL) 2.5 MG TABLET    Take 1 tablet (2.5 mg total) by mouth daily.   LOVASTATIN (MEVACOR) 40 MG TABLET    Take 1 tablet by mouth at bedtime.   MAGNESIUM OXIDE 500 MG TABS    Take 1  tablet by mouth daily.   METFORMIN (GLUCOPHAGE-XR) 500 MG 24 HR TABLET    TAKE ONE TABLET BY MOUTH EVERY DAY   MONTELUKAST (SINGULAIR) 10 MG TABLET    Take 1 tablet by mouth daily.   PANTOPRAZOLE (PROTONIX) 40 MG TABLET    TAKE ONE TABLET BY MOUTH EVERY DAY   RANITIDINE (ZANTAC) 300 MG TABLET    Take 1 tablet by mouth daily as needed.   TRIAMCINOLONE LOTION (KENALOG) 0.1 %    Apply 1 application topically 2 (two) times daily.   VITAMIN D, CHOLECALCIFEROL, 400 UNITS CHEW     Chew 1 tablet by mouth daily.    Review of Systems  Constitutional: Negative for fever, chills, weight loss, appetite change and fatigue.  HENT: Positive for rhinorrhea. Negative for ear pain, postnasal drip and sore throat.   Respiratory: Positive for cough and wheezing. Negative for hemoptysis, chest tightness and shortness of breath.   Cardiovascular: Negative for chest pain and palpitations.  Gastrointestinal: Negative for heartburn, nausea, vomiting and abdominal pain.  Musculoskeletal: Negative for myalgias.  Skin: Negative for rash.  Neurological: Negative for dizziness and weakness.    Social History  Substance Use Topics  . Smoking status: Former Smoker    Types: Cigarettes    Quit date: 10/06/2001  . Smokeless tobacco: Not on file  . Alcohol Use: No   Objective:   BP 106/62 mmHg  Pulse 68  Temp(Src) 97.8 F (36.6 C) (Oral)  Resp 16  SpO2 94%  Physical Exam   General Appearance:    Alert, cooperative, no distress  Eyes:    PERRL, conjunctiva/corneas clear, EOM's intact       Lungs:     Clear to auscultation bilaterally, respirations unlabored  Heart:    Regular rate and rhythm  Neurologic:   Awake, alert, oriented x 3. No apparent focal neurological           defect.   HEEN:   Mild congestion nasal mucosa. Small amount clear drainage.        Assessment & Plan:     1. Essential hypertension Did well on lisinopril before running out. Restart and check renal panel in 2 weeks.  - lisinopril (PRINIVIL,ZESTRIL) 2.5 MG tablet; Take 1 tablet (2.5 mg total) by mouth daily.  Dispense: 90 tablet; Refill: 0  Stop atenolol-chlorthalidone  - Renal function panel  2. Type 2 diabetes mellitus with other diabetic kidney complication Doing well current medications.   - Hemoglobin A1c  3. Allergic rhinitis, unspecified allergic rhinitis type Add OTC loratadine to current allergy medications.   4. Need for influenza vaccination  - Flu vaccine HIGH DOSE PF  5.  Cough Secondary to post nasal drainage.        Mila Merry, MD  [facility]

## 2015-03-23 NOTE — Telephone Encounter (Signed)
Pt stated that Tramadol was prescribed to her about 4 months ago, she said that she is almost out of it. She said that she takes Tramadol 50 mg BID for pain. Last ov was today. I cannot see a record of this medication in her chart.  Thanks,

## 2015-03-23 NOTE — Telephone Encounter (Signed)
Called patient concerning tramadol 50 mg request.  Medication is not in pt's med list. Patient stated that she was prescribed tramadol  by Dr. Sherrie Mustache in the past. Patient said that she has had the same rx for a while and it expired 6 months ago. Patient said that she meant to speak to Dr. Sherrie Mustache about the tramadol at her ov this morning but she forgot. Patient also wanted to let Dr. Sherrie Mustache know that she wants to take tramadol or something like it instead of the hydrocodone, because it is to strong for her. Please advise refill?

## 2015-03-23 NOTE — Telephone Encounter (Signed)
Pt need refill on her pain medication tramadol   She uses [facility]  Her call back is [phone]  Thanks Barth Kirks

## 2015-03-23 NOTE — Patient Instructions (Addendum)
   Start taking OTC loratadine (generic Claritin)  a day for allergies.    Return the first week of October for labs.

## 2015-03-24 MED ORDER — TRAMADOL HCL 50 MG PO TABS: 50.0000 mg | ORAL_TABLET | Freq: Four times a day (QID) | ORAL | Status: DC | PRN

## 2015-03-24 NOTE — Telephone Encounter (Signed)
Please call in tramadol Thanks 

## 2015-03-24 NOTE — Telephone Encounter (Signed)
Rx called in to pharmacy. 

## 2015-04-13 DIAGNOSIS — E1129 Type 2 diabetes mellitus with other diabetic kidney complication: Secondary | ICD-10-CM | POA: Diagnosis not present

## 2015-04-13 DIAGNOSIS — I10 Essential (primary) hypertension: Secondary | ICD-10-CM | POA: Diagnosis not present

## 2015-04-14 ENCOUNTER — Telehealth: Payer: Self-pay | Admitting: *Deleted

## 2015-04-14 LAB — RENAL FUNCTION PANEL
Albumin: 4 g/dL (ref 3.5–4.8)
BUN/Creatinine Ratio: 16 (ref 11–26)
BUN: 18 mg/dL (ref 8–27)
CO2: 23 mmol/L (ref 18–29)
Calcium: 9.7 mg/dL (ref 8.7–10.3)
Chloride: 98 mmol/L (ref 97–108)
Creatinine, Ser: 1.16 mg/dL — ABNORMAL HIGH (ref 0.57–1.00)
GFR calc Af Amer: 52 mL/min/{1.73_m2} — ABNORMAL LOW (ref >59)
GFR calc non Af Amer: 45 mL/min/{1.73_m2} — ABNORMAL LOW (ref >59)
Glucose: 121 mg/dL — ABNORMAL HIGH (ref 65–99)
Phosphorus: 3.8 mg/dL (ref 2.5–4.5)
Potassium: 4.4 mmol/L (ref 3.5–5.2)
Sodium: 138 mmol/L (ref 134–144)

## 2015-04-14 LAB — HEMOGLOBIN A1C
Est. average glucose Bld gHb Est-mCnc: 128 mg/dL
Hgb A1c MFr Bld: 6.1 % — ABNORMAL HIGH (ref 4.8–5.6)

## 2015-04-14 NOTE — Telephone Encounter (Signed)
Patient notified. Patient expressed understanding. Patient wanted to know when she should schedule her next ov follow-up?

## 2015-04-14 NOTE — Telephone Encounter (Signed)
Patient wanted to know what OTC medication she can get for a hacking cough? Patient stated that she coughs all day and night.

## 2015-04-14 NOTE — Telephone Encounter (Signed)
Any fevers, chills, sweats, shortness of breath of chest pain. If so need office visit. Otherwise can take OTC Delsym or Robitussin DM. If these not effective she would need to be seen in office for further evaluation

## 2015-04-15 NOTE — Telephone Encounter (Signed)
She is due for follow up in mid December.

## 2015-04-17 NOTE — Telephone Encounter (Signed)
Advised pt as directed. Scheduled pt for Dec 8th at 8:45 am. For a f/u appointment.  Thanks,

## 2015-04-28 ENCOUNTER — Telehealth: Payer: Self-pay | Admitting: Family Medicine

## 2015-04-28 DIAGNOSIS — J449 Chronic obstructive pulmonary disease, unspecified: Secondary | ICD-10-CM

## 2015-04-28 NOTE — Telephone Encounter (Signed)
Renea EeEvelyn with [facility] called with pt on the line to ask about the referral that pt had called about before. Pt was upset and saying that she canceled the appt and she doesn't understand why we are requiring a referral and she isn't coming back in for another physical just to get the referral. I advised pt that we don't require the referral [employer] does and that I was sending back a message to see if the referral could be done based on her last appt. Renea Eevelyn told pt that it is [employer] that requires the referral b/c that is the plan she chose. Pt stated that [employer] is nothing but trouble and hung up.   Renea Eevelyn called pt back and brought me back on the line. Pt was yelling saying she was tried of dealing with this today and the appt was canceled. Renea Eevelyn told pt she would call the doctors office back to see if the appt could be scheduled for the original appt. Pt stated that she will make her own appts and she doesn't want to deal with this anymore and hung up.  I looked in pt's chart to see what Dr. Rock NephewPt had been seeing and it looks like Dr. Meredeth IdeFleming. Renea Eevelyn said she was going to try to contact their office to see if they could help the pt.

## 2015-04-28 NOTE — Telephone Encounter (Signed)
Pt called to get a referral for her Pulmonologist. I tried to get information about who she was seeing and if we had referred her before. Pt stated she has been going to this doctor for 4 years and now that her insurance has changed to [facility] this is the first time she has needed a referral. I was trying to get information about what doctor and office and pt wouldn't let me speak. She started yelling and saying that if we didn't want to do the referral that was fine that she would cancel the appointments and when she dies it would be a different story. I tried to explain I would send back a message to get the referral started and she said someone had better call her today to let her know this has been done b/c the appt is for 05/04/15. I advised pt that the person that handles our referrals is out of the office today. Pt yelled forget it and hung up. Thanks TNP

## 2015-05-01 NOTE — Telephone Encounter (Signed)
It looks like patient needs insurance referral for Dr. Meredeth IdeFleming for COPD. She is already followed by Dr. Meredeth IdeFleming and apparently has an appointment on 05/04/2015

## 2015-05-15 ENCOUNTER — Other Ambulatory Visit: Payer: Self-pay | Admitting: Family Medicine

## 2015-05-25 DIAGNOSIS — R05 Cough: Secondary | ICD-10-CM | POA: Diagnosis not present

## 2015-05-25 DIAGNOSIS — J439 Emphysema, unspecified: Secondary | ICD-10-CM | POA: Diagnosis not present

## 2015-05-25 DIAGNOSIS — R0602 Shortness of breath: Secondary | ICD-10-CM | POA: Diagnosis not present

## 2015-05-25 DIAGNOSIS — J310 Chronic rhinitis: Secondary | ICD-10-CM | POA: Diagnosis not present

## 2015-06-15 ENCOUNTER — Ambulatory Visit: Payer: Self-pay | Admitting: Family Medicine

## 2015-06-17 ENCOUNTER — Other Ambulatory Visit: Payer: Self-pay | Admitting: Family Medicine

## 2015-06-20 ENCOUNTER — Encounter: Payer: Self-pay | Admitting: Family Medicine

## 2015-06-20 ENCOUNTER — Telehealth: Payer: Self-pay

## 2015-06-20 ENCOUNTER — Ambulatory Visit (INDEPENDENT_AMBULATORY_CARE_PROVIDER_SITE_OTHER): Payer: Medicare Other | Admitting: Family Medicine

## 2015-06-20 VITALS — BP 138/62 | HR 71 | Temp 98.2°F | Resp 16

## 2015-06-20 DIAGNOSIS — M1990 Unspecified osteoarthritis, unspecified site: Secondary | ICD-10-CM

## 2015-06-20 DIAGNOSIS — I10 Essential (primary) hypertension: Secondary | ICD-10-CM

## 2015-06-20 DIAGNOSIS — K219 Gastro-esophageal reflux disease without esophagitis: Secondary | ICD-10-CM

## 2015-06-20 DIAGNOSIS — R059 Cough, unspecified: Secondary | ICD-10-CM

## 2015-06-20 DIAGNOSIS — J309 Allergic rhinitis, unspecified: Secondary | ICD-10-CM

## 2015-06-20 DIAGNOSIS — R05 Cough: Secondary | ICD-10-CM

## 2015-06-20 MED ORDER — ESOMEPRAZOLE MAGNESIUM 40 MG PO CPDR: 40.0000 mg | DELAYED_RELEASE_CAPSULE | Freq: Every day | ORAL | Status: DC

## 2015-06-20 MED ORDER — LISINOPRIL 5 MG PO TABS: 5.0000 mg | ORAL_TABLET | Freq: Every day | ORAL | Status: DC

## 2015-06-20 MED ORDER — IPRATROPIUM BROMIDE 0.06 % NA SOLN: 2.0000 | Freq: Four times a day (QID) | NASAL | Status: DC

## 2015-06-20 MED ORDER — BENZONATATE 100 MG PO CAPS: 100.0000 mg | ORAL_CAPSULE | Freq: Three times a day (TID) | ORAL | Status: DC | PRN

## 2015-06-20 MED ORDER — TRAMADOL HCL 50 MG PO TABS: 50.0000 mg | ORAL_TABLET | Freq: Three times a day (TID) | ORAL | Status: DC | PRN

## 2015-06-20 NOTE — Progress Notes (Signed)
Patient: Kayla Boyle Female    DOB: 10/02/1934   80 y.o.   MRN: [id number] Visit Date: 06/20/2015  Today's Provider: Mila Merryonald Aikam Vinje, MD   Chief Complaint  Patient presents with  . Hypertension    follow up   Subjective:    HPI   Hypertension, follow-up:  BP Readings from Last 3 Encounters:  03/23/15 106/62  02/09/15 100/58  01/02/15 108/62    She was last seen for hypertension 3 months ago.  BP at that visit was 106/62. Management since that visit includes restarting Lisinopril and advising patient to stop taking Atenolol- Chlorthalidone. She reports fair compliance with treatment. Patient states she had her blood pressure checked a few weeks ago at Dr. Gus Boyle office and the systolic reading was 187. Patient has been taking Atenolol-Chlorthalidone when her blood pressure runs high. Her last dose of Atenolol-Chlorthalidone was taken 2 days ago.  She is having side effects. Patient states when she takes the Lisinopril she feels sluggish.  She is not adherent to low salt diet.   Outside blood pressures are not being checked. She is experiencing dyspnea and fatigue.  Patient denies chest pain, chest pressure/discomfort, claudication, exertional chest pressure/discomfort, irregular heart beat, lower extremity edema, near-syncope, orthopnea, palpitations, paroxysmal nocturnal dyspnea, syncope and tachypnea.       Weight trend: stable Wt Readings from Last 3 Encounters:  01/02/15 159 lb (72.122 kg)  09/01/14 162 lb (73.483 kg)    Current diet: well balanced  ------------------------------------------------------------------------   GERD, Follow up:  The patient was last seen for GERD 3 months ago. Current treatment includes Pantoprazole 40mg  daily.   She reports good compliance with treatment. She is not having side effects. Patient feels the Pantoprazole is not helping with reflux. Is having a lot burping  She IS experiencing heartburn a couple of times per  week. .  ------------------------------------------------------------------------   Follow up back pain: Patient reports her back pain has worsened and the Tramadol and Hydrocodone are no longer  effective in relieving pain. Patient Currently takes 1/2 tablet of Hydrocodone- Acetaminophen at  night and Tramadol 50mg  every 6 hours as needed.   Cough She follows up with Dr. Meredeth Boyle about 3 times a year. She states she has constant nasal drainage and post nasal drainage that makes her cough. He has been taking montelukast and using Flonase nasal inhaler which is not helping.     Allergies  Allergen Reactions  . Bee Venom Hives  . Codeine Rash and Swelling  . Penicillins Rash and Swelling   Previous Medications   ALBUTEROL (PROVENTIL HFA;VENTOLIN HFA) 108 (90 BASE) MCG/ACT INHALER    Inhale 2 puffs into the lungs every 4 (four) hours as needed.   ALENDRONATE (FOSAMAX) 70 MG TABLET    Take 1 tablet by mouth once a week. 30 minutes before first food, beverage   ASPIRIN 81 MG TABLET    Take 1 tablet by mouth daily.   FLUTICASONE (FLONASE) 50 MCG/ACT NASAL SPRAY    Place 2 sprays into both nostrils daily.   GLUCOSE BLOOD (ACCU-CHEK AVIVA PLUS) TEST STRIP    Use to check blood sugar once a day   HYDROCODONE-ACETAMINOPHEN (NORCO/VICODIN) 5-325 MG PER TABLET    Take 1 tablet by mouth every 6 (six) hours as needed.   LEVOTHYROXINE (SYNTHROID, LEVOTHROID) 75 MCG TABLET    TAKE ONE TABLET BY MOUTH EVERY DAY   LISINOPRIL (PRINIVIL,ZESTRIL) 2.5 MG TABLET    Take 1 tablet (2.5 mg  total) by mouth daily.   LOVASTATIN (MEVACOR) 40 MG TABLET    Take 1 tablet by mouth at bedtime.   MAGNESIUM OXIDE 500 MG TABS    Take 1 tablet by mouth daily.   METFORMIN (GLUCOPHAGE-XR) 500 MG 24 HR TABLET    TAKE ONE TABLET BY MOUTH EVERY DAY   MONTELUKAST (SINGULAIR) 10 MG TABLET    TAKE ONE TABLET BY MOUTH EVERY DAY   PANTOPRAZOLE (PROTONIX) 40 MG TABLET    TAKE ONE TABLET BY MOUTH EVERY DAY   TRAMADOL (ULTRAM) 50 MG  TABLET    Take 1 tablet (50 mg total) by mouth every 6 (six) hours as needed.   TRIAMCINOLONE LOTION (KENALOG) 0.1 %    Apply 1 application topically 2 (two) times daily.   VITAMIN D, CHOLECALCIFEROL, 400 UNITS CHEW    Chew 1 tablet by mouth daily.    Review of Systems  Constitutional: Positive for fatigue. Negative for fever, chills and appetite change.  HENT: Positive for nosebleeds and rhinorrhea.   Respiratory: Positive for cough. Negative for chest tightness and shortness of breath.   Cardiovascular: Negative for chest pain and palpitations.  Gastrointestinal: Negative for nausea, vomiting and abdominal pain.  Musculoskeletal: Positive for back pain.  Neurological: Negative for dizziness and weakness.    Social History  Substance Use Topics  . Smoking status: Former Smoker    Types: Cigarettes    Quit date: 10/06/2001  . Smokeless tobacco: Not on file  . Alcohol Use: No   Objective:   BP 118/62 mmHg  Pulse 71  Temp(Src) 98.2 F (36.8 C) (Other (Comment))  Resp 16  Wt   SpO2 95%  Physical Exam   General Appearance:    Alert, cooperative, no distress  Eyes:    PERRL, conjunctiva/corneas clear, EOM's intact       Lungs:     Clear to auscultation bilaterally, respirations unlabored  Heart:    Regular rate and rhythm  Neurologic:   Awake, alert, oriented x 3. No apparent focal neurological           defect.           Assessment & Plan:     1. Essential hypertension BP remains labile. She would like to go up on dose of lisinopril so she doesn't have to take atenolol as often - lisinopril (PRINIVIL,ZESTRIL) 5 MG tablet; Take 1 tablet (5 mg total) by mouth daily.  Dispense: 90 tablet; Refill: 1  2. Cough Probably secondary to post nasal drainage. Add Atrovent nasal spray as below. May also be related to GERD and will change PPI   3. Allergic rhinitis, unspecified allergic rhinitis type Atrovent nasal spray. May take in place of or in addition to Flonase and  montelukast  4. Gastroesophageal reflux disease, esophagitis presence not specified Having reflux a few times a week and she desires to change to a different PPI. Will stop pantoprazole.  - esomeprazole (NEXIUM) 40 MG capsule; Take 1 capsule (40 mg total) by mouth daily. Take instead of pantoprazole for reflux  Dispense: 30 capsule; Refill: 3  5. Arthritis  She requests a higher dose of tramadol. Advises she could take 2 of the  tramadol 3 times a day.       Kayla Merry, MD  [facility]

## 2015-06-20 NOTE — Telephone Encounter (Signed)
Patient called stating she was seen this morning for an office visit. She states she wanted a refill on the Benzonatate to help with cough. Patient states Dr. Mayo AoFlemming last prescribed this for her but she doesn't have any refills remaining. I spoke with Dr. Sherrie MustacheFisher and advised him that patient requests a refill. Per Dr. Sherrie MustacheFisher, ok to refill medication. Medication sent to pharmacy.

## 2015-06-21 ENCOUNTER — Telehealth: Payer: Self-pay | Admitting: Family Medicine

## 2015-06-21 NOTE — Telephone Encounter (Signed)
Pt stated that her insurance doesn't cover  ipratropium (ATROVENT) 0.06 % nasal spray. Pt would [facility] that her insurance will cover or a pill that will dry up her sinus drainage because her coughing is hurting her ribs. Please advise. Thanks TNP

## 2015-06-21 NOTE — Telephone Encounter (Signed)
Pt called back stated that it wasn't the nose spray that the insurance will not cover it is the benzonatate (TESSALON) 100 MG capsule. Pt stated that she needs something else sent in for the cough that her insurance will cover. Thanks TNP

## 2015-06-21 NOTE — Telephone Encounter (Signed)
There are no other cough medications that can be called in. Benzonatate is a generic drug and should not cost much at all if she pays out of pocket. If it is too expensive at [facility] she should call around to other pharmacies because it is pretty cheap.

## 2015-06-22 NOTE — Telephone Encounter (Signed)
Tried to contact pt,line busy. Will try again later.

## 2015-06-23 NOTE — Telephone Encounter (Signed)
Patient was notified. Expressed understanding.  

## 2015-07-05 ENCOUNTER — Other Ambulatory Visit: Payer: Self-pay | Admitting: Family Medicine

## 2015-07-12 ENCOUNTER — Telehealth: Payer: Self-pay | Admitting: *Deleted

## 2015-07-12 MED ORDER — ACCU-CHEK COMPACT PLUS CARE KIT: PACK | Status: DC

## 2015-07-12 NOTE — Telephone Encounter (Signed)
Received fax from [facility] requesting rx for Accu-Chek Kit Compact Plus.

## 2015-07-21 ENCOUNTER — Telehealth: Payer: Self-pay

## 2015-07-21 NOTE — Telephone Encounter (Signed)
Fax from [facility] for one touch delica lancets, one touch ultra blue strips. Please review-aa

## 2015-07-26 DIAGNOSIS — R32 Unspecified urinary incontinence: Secondary | ICD-10-CM | POA: Diagnosis not present

## 2015-07-31 ENCOUNTER — Telehealth: Payer: Self-pay | Admitting: Family Medicine

## 2015-07-31 DIAGNOSIS — R059 Cough, unspecified: Secondary | ICD-10-CM

## 2015-07-31 DIAGNOSIS — R05 Cough: Secondary | ICD-10-CM

## 2015-07-31 MED ORDER — BENZONATATE 100 MG PO CAPS: 100.0000 mg | ORAL_CAPSULE | Freq: Three times a day (TID) | ORAL | Status: DC | PRN

## 2015-07-31 MED ORDER — PREDNISONE 10 MG PO TABS: ORAL_TABLET | ORAL | Status: AC

## 2015-07-31 NOTE — Telephone Encounter (Signed)
Pt stated that after her OV in Dec 2016 she started feeling a little better but it came back. Pt stated that she still has a sinus drip and cough. Pt would like a refill of benzonatate (TESSALON) 100 MG capsule and she stated that she was given Prednisone in Dec but I didn't see it on her list. Pt would like both sent to [facility]. Please advise. Thanks TNP

## 2015-07-31 NOTE — Telephone Encounter (Signed)
Can send in 6 day prednisone taper.  

## 2015-07-31 NOTE — Telephone Encounter (Signed)
Patient advised that  prednisone would be sent into her pharmacy. Patient uses [facility]. Dr. Sherrie Mustache, Im not sure how to put this medication in patient chart to send to the pharmacy since it is not a current medication and we don't have a short list to choose from with Prednisone tapers. Could you put the medication in or tell me the dosage and sig. Thanks

## 2015-07-31 NOTE — Telephone Encounter (Signed)
Patient is also requesting prednisone. She reports that this helped her sinus drip and her nasal spray is not working.

## 2015-07-31 NOTE — Telephone Encounter (Signed)
Ok to send in benzonatate  into the pharmacy? Please advise. Thanks!

## 2015-07-31 NOTE — Telephone Encounter (Signed)
Have sent refill for benzonatate to [facility] pharmacy

## 2015-08-15 ENCOUNTER — Encounter: Payer: Self-pay | Admitting: Family Medicine

## 2015-08-15 ENCOUNTER — Telehealth: Payer: Self-pay | Admitting: Family Medicine

## 2015-08-15 ENCOUNTER — Ambulatory Visit (INDEPENDENT_AMBULATORY_CARE_PROVIDER_SITE_OTHER): Payer: Medicare Other | Admitting: Family Medicine

## 2015-08-15 VITALS — BP 124/66 | HR 86 | Temp 98.1°F | Resp 16

## 2015-08-15 DIAGNOSIS — R05 Cough: Secondary | ICD-10-CM

## 2015-08-15 DIAGNOSIS — K219 Gastro-esophageal reflux disease without esophagitis: Secondary | ICD-10-CM | POA: Diagnosis not present

## 2015-08-15 DIAGNOSIS — R059 Cough, unspecified: Secondary | ICD-10-CM

## 2015-08-15 MED ORDER — ESOMEPRAZOLE MAGNESIUM 40 MG PO CPDR: 40.0000 mg | DELAYED_RELEASE_CAPSULE | Freq: Every day | ORAL | Status: DC

## 2015-08-15 MED ORDER — MOMETASONE FURO-FORMOTEROL FUM 200-5 MCG/ACT IN AERO: 2.0000 | INHALATION_SPRAY | Freq: Two times a day (BID) | RESPIRATORY_TRACT | Status: DC

## 2015-08-15 NOTE — Patient Instructions (Signed)
Go to the [facility] on [address] for chest Xray  

## 2015-08-15 NOTE — Telephone Encounter (Signed)
Pt called to tell you that she has an appt on Feb 14th at 8:00am for a CXR.    Her call back is [phone]  Thanks Barth Kirks

## 2015-08-15 NOTE — Progress Notes (Signed)
Patient: Kayla Boyle Female    DOB: September 20, 1934   81 y.o.   MRN: [id number] Visit Date: 08/15/2015  Today's Provider: Lelon Huh, MD   Chief Complaint  Patient presents with  . Foot Swelling  . Cough   Subjective:    Cough This is a recurrent problem. Episode onset: 4 months ago. The problem has been unchanged. The cough is non-productive. Associated symptoms include chest pain (pain in ribs when coughing), headaches, nasal congestion, postnasal drip, rhinorrhea, shortness of breath, sweats and wheezing. Pertinent negatives include no chills, ear congestion, ear pain or fever. Nothing aggravates the symptoms. Treatments tried: Tessalon pearls. The treatment provided no relief. Her past medical history is significant for COPD.   We first so her for this in mid December and changed PPI, prescribed tessalon and tried atrovent nasal spray and improved somewhat, but flared back up about 2 weeks ago. She then called and requested prednisone and we sent in 6 day taper. She states cough then completely resolved, but that is when she started to notice swelling in foot below.   Swelling of foot: She has had swelling in her left foot and ankle for 1 month (she is s/p right bka). Patient feels the swelling is improving. Patient states she had to cut the sides of her shoe to get her foot into it. Patient has also had pain and redness on the top of her left foot a few days ago, but has now resolved.     Allergies  Allergen Reactions  . Bee Venom Hives  . Codeine Rash and Swelling  . Penicillins Rash and Swelling   Previous Medications   ALBUTEROL (PROVENTIL HFA;VENTOLIN HFA) 108 (90 BASE) MCG/ACT INHALER    Inhale 2 puffs into the lungs every 4 (four) hours as needed.   ALENDRONATE (FOSAMAX) 70 MG TABLET    Take 1 tablet by mouth once a week. 30 minutes before first food, beverage   ASPIRIN 81 MG TABLET    Take 1 tablet by mouth daily.   BENZONATATE (TESSALON) 100 MG CAPSULE    Take 1  capsule (100 mg total) by mouth 3 (three) times daily as needed for cough.   BLOOD GLUCOSE MONITORING SUPPL (ACCU-CHEK COMPACT CARE KIT) KIT    As directed   ESOMEPRAZOLE (NEXIUM) 40 MG CAPSULE    Take 1 capsule (40 mg total) by mouth daily. Take instead of pantoprazole for reflux   FLUTICASONE (FLONASE) 50 MCG/ACT NASAL SPRAY    Place 2 sprays into both nostrils daily.   GLUCOSE BLOOD (ACCU-CHEK AVIVA PLUS) TEST STRIP    Use to check blood sugar once a day   HYDROCODONE-ACETAMINOPHEN (NORCO/VICODIN) 5-325 MG PER TABLET    Take 1 tablet by mouth every 6 (six) hours as needed.   IPRATROPIUM (ATROVENT) 0.06 % NASAL SPRAY    Place 2 sprays into both nostrils 4 (four) times daily.   LEVOTHYROXINE (SYNTHROID, LEVOTHROID) 75 MCG TABLET    TAKE ONE TABLET BY MOUTH EVERY DAY   LISINOPRIL (PRINIVIL,ZESTRIL) 5 MG TABLET    Take 1 tablet (5 mg total) by mouth daily.   LOVASTATIN (MEVACOR) 40 MG TABLET    Take 1 tablet by mouth at bedtime.   MAGNESIUM OXIDE 500 MG TABS    Take 1 tablet by mouth daily.   METFORMIN (GLUCOPHAGE-XR) 500 MG 24 HR TABLET    TAKE ONE TABLET BY MOUTH EVERY DAY   MONTELUKAST (SINGULAIR) 10 MG TABLET  TAKE ONE TABLET BY MOUTH EVERY DAY   TRAMADOL (ULTRAM) 50 MG TABLET    Take 1-2 tablets (50-100 mg total) by mouth every 8 (eight) hours as needed.   TRIAMCINOLONE LOTION (KENALOG) 0.1 %    Apply 1 application topically 2 (two) times daily.   VITAMIN D, CHOLECALCIFEROL, 400 UNITS CHEW    Chew 1 tablet by mouth daily.    Review of Systems  Constitutional: Negative for fever and chills.  HENT: Positive for postnasal drip and rhinorrhea. Negative for ear pain.   Respiratory: Positive for cough, shortness of breath and wheezing.   Cardiovascular: Positive for chest pain (pain in ribs when coughing).  Neurological: Positive for headaches.    Social History  Substance Use Topics  . Smoking status: Former Smoker    Types: Cigarettes    Quit date: 10/06/2001  . Smokeless tobacco:  Not on file  . Alcohol Use: No   Objective:   BP 124/66 mmHg  Pulse 86  Temp(Src) 98.1 F (36.7 C) (Oral)  Resp 16  SpO2 97%  Physical Exam   General Appearance:    Alert, cooperative, no distress  Eyes:    PERRL, conjunctiva/corneas clear, EOM's intact       Lungs:     Occasional expiratory wheezed, no rhonchi or rales respirations unlabored  Heart:    Regular rate and rhythm  Neurologic:   Awake, alert, oriented x 3. No apparent focal neurological           defect.   Ext:   Trace left pedal edema, no erythema, no tenderness.        Assessment & Plan:     1. Cough She states it completely resolved when she was on prednisone. We try another steroid inhaler and check Xr of lungs. She states she does not have pulmonary follow up until the summer - DG Chest 2 View; Future - mometasone-formoterol (DULERA) 200-5 MCG/ACT AERO; Inhale 2 puffs into the lungs 2 (two) times daily.  Dispense: 8.8 g; Refill: 0  2. Gastroesophageal reflux disease, esophagitis presence not specified Well controlled needs refill Nexium.  - esomeprazole (NEXIUM) 40 MG capsule; Take 1 capsule (40 mg total) by mouth daily. Take instead of pantoprazole for reflux  Dispense: 90 capsule; Refill: 3       Lelon Huh, MD  [facility]

## 2015-08-22 ENCOUNTER — Telehealth: Payer: Self-pay | Admitting: Family Medicine

## 2015-08-22 ENCOUNTER — Telehealth: Payer: Self-pay

## 2015-08-22 ENCOUNTER — Ambulatory Visit
Admission: RE | Admit: 2015-08-22 | Discharge: 2015-08-22 | Disposition: A | Discharge status: Home / Self Care | Payer: Medicare Other | Source: Ambulatory Visit | Attending: Family Medicine | Admitting: Family Medicine

## 2015-08-22 DIAGNOSIS — R053 Chronic cough: Secondary | ICD-10-CM

## 2015-08-22 DIAGNOSIS — R05 Cough: Secondary | ICD-10-CM | POA: Insufficient documentation

## 2015-08-22 DIAGNOSIS — R059 Cough, unspecified: Secondary | ICD-10-CM

## 2015-08-22 DIAGNOSIS — R918 Other nonspecific abnormal finding of lung field: Secondary | ICD-10-CM | POA: Insufficient documentation

## 2015-08-22 DIAGNOSIS — J449 Chronic obstructive pulmonary disease, unspecified: Secondary | ICD-10-CM

## 2015-08-22 HISTORY — DX: Personal history of poliomyelitis: Z86.12

## 2015-08-22 NOTE — Telephone Encounter (Signed)
Kayla Boyle with [facility] [employer] [facility] is requesting a written Rx for incontinence pad and adult pull-ups.  Please fax to [phone].  UJ#[phone]/FA

## 2015-08-22 NOTE — Telephone Encounter (Signed)
Patient reports that she still has cough. She is requesting something be called into the pharmacy. She denies fever, but does have shortness of breath with exertion, PND, and nasal congestion. Patient uses [facility].   Patient also mentions that [facility] home[facility] faxed over an order for her to get pads and she needs it completed and faxed back as soon as possible due to her running out. She reports that we can put it to Angela's attention.  Thanks!

## 2015-08-22 NOTE — Telephone Encounter (Signed)
-----   Message from Malva Limes, MD sent at 08/22/2015  1:33 PM EST ----- Lungs show changes of emphysema, otherwise normal. If cough is bette than no other evaluation needed.

## 2015-08-22 NOTE — Telephone Encounter (Signed)
Order written, please fax

## 2015-08-22 NOTE — Telephone Encounter (Signed)
Order faxed.

## 2015-08-23 ENCOUNTER — Telehealth: Payer: Self-pay | Admitting: Family Medicine

## 2015-08-23 NOTE — Telephone Encounter (Signed)
Pt is returning call.  CB#[phone]/MW

## 2015-08-23 NOTE — Telephone Encounter (Signed)
LMOVM for pt to return call 

## 2015-08-23 NOTE — Telephone Encounter (Signed)
There are no other cough medications to try. She needs referral to pulmonologist for further evaluation of cough if the new inhaler hasn't helped.

## 2015-08-23 NOTE — Telephone Encounter (Signed)
LMOVM for Angela to return call. 

## 2015-08-23 NOTE — Telephone Encounter (Signed)
Patient was notified. Patient is requesting an rx for lasix, due to swelling in her feet.

## 2015-08-23 NOTE — Telephone Encounter (Signed)
Pt stated that the orders that were faxed to [facility] with [facility] are not being processed b/c of the code. I asked if it was a DX code and she said it was the code on the back and it was stopping it from going further. Pt stated that she is out of the supplies. Pt stated that Angela's # is [phone] and maybe she could explain better what they need to help get pt her supplies. Thanks TNP

## 2015-08-24 ENCOUNTER — Telehealth: Payer: Self-pay | Admitting: Family Medicine

## 2015-08-24 DIAGNOSIS — R053 Chronic cough: Secondary | ICD-10-CM | POA: Insufficient documentation

## 2015-08-24 DIAGNOSIS — R05 Cough: Secondary | ICD-10-CM | POA: Insufficient documentation

## 2015-08-24 MED ORDER — FUROSEMIDE 20 MG PO TABS
20.0000 mg | ORAL_TABLET | Freq: Every day | ORAL | Status: DC | PRN
Start: 2015-08-24 — End: 2015-12-14

## 2015-08-24 NOTE — Telephone Encounter (Signed)
Pt is requesting that something be called into [facility] pharmacy for post nasal drip.She states that Flonase does not work for her.Also when talking to pt about referral to pulmonologist she states that she has an appointment with Dr Meredeth Ide in May and does not need an earlier appointment

## 2015-08-24 NOTE — Telephone Encounter (Signed)
Spoke with Kayla Boyle. She requested that we refax order for adult pull-ups. Order was re-faxed.

## 2015-08-25 MED ORDER — AZELASTINE HCL 0.15 % NA SOLN
NASAL | Status: DC
Start: 2015-08-25 — End: 2017-04-17

## 2015-08-25 NOTE — Telephone Encounter (Signed)
Have sent prescription for astepro to pharmacy. If this does not work she will need see ENT

## 2015-08-25 NOTE — Telephone Encounter (Signed)
Left message on pt's vm. Okay per dpr.  

## 2015-08-28 DIAGNOSIS — R32 Unspecified urinary incontinence: Secondary | ICD-10-CM | POA: Diagnosis not present

## 2015-09-19 DIAGNOSIS — M545 Low back pain: Secondary | ICD-10-CM | POA: Diagnosis not present

## 2015-09-19 DIAGNOSIS — M479 Spondylosis, unspecified: Secondary | ICD-10-CM | POA: Diagnosis not present

## 2015-09-19 DIAGNOSIS — M5136 Other intervertebral disc degeneration, lumbar region: Secondary | ICD-10-CM | POA: Diagnosis not present

## 2015-10-16 ENCOUNTER — Telehealth: Payer: Self-pay | Admitting: Family Medicine

## 2015-10-16 MED ORDER — METHOCARBAMOL 500 MG PO TABS: 1000.0000 mg | ORAL_TABLET | Freq: Four times a day (QID) | ORAL | Status: DC | PRN

## 2015-10-16 NOTE — Telephone Encounter (Signed)
Please advise 

## 2015-10-16 NOTE — Telephone Encounter (Signed)
Pt called in saying she is having bad back pain and the tramadol is not helping.   She wants to know if you can call her in a muscle relaxer and strong pain med.  She said she can not come in until her appt on the 18th.  Her call back is  [phone]  [facility] uses [facility].  Please call her and let her know when it has been called in,  [employer]anks[employer]eri

## 2015-10-16 NOTE — Telephone Encounter (Signed)
Is she still taking hydrocodone? It is on her medication list, but i don't see that we have written for awhile. If not we can print up prescription for this and a muscle relaxer... But she will have to pick up prescription.

## 2015-10-16 NOTE — Telephone Encounter (Signed)
Patient was notified.

## 2015-10-16 NOTE — Telephone Encounter (Signed)
Patient stated that she is out of hydrocodone but she has no way to pick-up an rx. Patient is requesting either a stronger dose of tramadol or another muscle relaxer that can be called into pharmacy.

## 2015-10-16 NOTE — Telephone Encounter (Signed)
rx for methocarbimol (Robaxin) has been sent to pharmacy

## 2015-10-24 ENCOUNTER — Ambulatory Visit (INDEPENDENT_AMBULATORY_CARE_PROVIDER_SITE_OTHER): Payer: Medicare Other | Admitting: Family Medicine

## 2015-10-24 ENCOUNTER — Encounter: Payer: Self-pay | Admitting: Family Medicine

## 2015-10-24 VITALS — BP 122/72 | HR 93 | Temp 98.2°F | Resp 16

## 2015-10-24 DIAGNOSIS — E039 Hypothyroidism, unspecified: Secondary | ICD-10-CM | POA: Diagnosis not present

## 2015-10-24 DIAGNOSIS — M545 Low back pain, unspecified: Secondary | ICD-10-CM | POA: Insufficient documentation

## 2015-10-24 DIAGNOSIS — E1122 Type 2 diabetes mellitus with diabetic chronic kidney disease: Secondary | ICD-10-CM | POA: Diagnosis not present

## 2015-10-24 DIAGNOSIS — N183 Chronic kidney disease, stage 3 (moderate): Secondary | ICD-10-CM

## 2015-10-24 DIAGNOSIS — I10 Essential (primary) hypertension: Secondary | ICD-10-CM

## 2015-10-24 DIAGNOSIS — R21 Rash and other nonspecific skin eruption: Secondary | ICD-10-CM

## 2015-10-24 LAB — POCT GLYCOSYLATED HEMOGLOBIN (HGB A1C)
Est. average glucose Bld gHb Est-mCnc: 148
Hemoglobin A1C: 6.8

## 2015-10-24 MED ORDER — HYDROCODONE-ACETAMINOPHEN 5-325 MG PO TABS: 1.0000 | ORAL_TABLET | Freq: Three times a day (TID) | ORAL | Status: DC | PRN

## 2015-10-24 NOTE — Patient Instructions (Signed)
You can take OTC Allegra (fexofenadine) in addition to current medications for allergies

## 2015-10-24 NOTE — Progress Notes (Signed)
Patient: Kayla Boyle Female    DOB: Jun 10, 1935   81 y.o.   MRN: [id number] Visit Date: 10/24/2015  Today's Provider: Lelon Huh, MD   Chief Complaint  Patient presents with  . Hypertension    follow up  . Diabetes    follow up  . Gastroesophageal Reflux    follow up  . Arthritis    follow up   Subjective:    HPI  Hypertension, follow-up:  BP Readings from Last 3 Encounters:  08/15/15 124/66  06/20/15 138/62  03/23/15 106/62    She was last seen for hypertension 4 months ago.  BP at that visit was 138/62. Management since that visit includes increasing Lisinopril from 2.12m to 526m. She reports good compliance with treatment. She is not having side effects.  She is exercising. She is adherent to low salt diet.   Outside blood pressures are not being checked. She is experiencing lower extremity edema.  Patient denies chest pain, chest pressure/discomfort, claudication, dyspnea, exertional chest pressure/discomfort, fatigue, irregular heart beat, near-syncope and orthopnea.   Cardiovascular risk factors include advanced age (older than 5563or men, 6572or women), diabetes mellitus and hypertension.  Use of agents associated with hypertension: NSAIDS.     Weight trend: stable Wt Readings from Last 3 Encounters:  01/02/15 159 lb (72.122 kg)  09/01/14 162 lb (73.483 kg)    Current diet: well balanced  ------------------------------------------------------------------------   Diabetes Mellitus Type II, Follow-up:   Lab Results  Component Value Date   HGBA1C 6.1* 04/13/2015   HGBA1C 6.3* 01/02/2015   HGBA1C 6.5* 09/01/2014    Last seen for diabetes 7 months ago.  Management since then includes no changes. She reports good compliance with treatment. She is not having side effects.  Current symptoms include polydipsia and polyuria and have been stable. Home blood sugar records: 2 hours after eating averages 150  Episodes of hypoglycemia? yes -     Current Insulin Regimen: none Most Recent Eye Exam: 1 year Weight trend: stable Prior visit with dietician: no Current diet: well balanced Current exercise: none  Pertinent Labs:    Component Value Date/Time   CHOL 171 01/02/2015 1048   CHOL 176 09/01/2014   TRIG 168* 01/02/2015 1048   HDL 58 01/02/2015 1048   HDL 61 09/01/2014   LDLCALC 79 01/02/2015 1048   LDLCALC 72 09/01/2014   CREATININE 1.16* 04/13/2015 0811   CREATININE 1.2* 09/01/2014    Wt Readings from Last 3 Encounters:  01/02/15 159 lb (72.122 kg)  09/01/14 162 lb (73.483 kg)    ------------------------------------------------------------------------  Follow up GERD: Last office visit was 2 months ago and no changes were made. Patient reports good compliance with treatment, good tolerance and good symptom control.  Follow up Arthritis: Last office visit was 4 months ago. Changes made include increasing Tramadol 407mo 2 pills three times daily. Changes made since that last office visit includes starting Robaxin due to worsening pain. Patient comes in today stating the pain in her back is worse and the Tramadol is not helping.    Complains of left lower leg and food getting red and swollen off and on the last couple of months, but does not hurt.   She also wants to know if there is anything else she can take for allergies due to persistent sinus drainage and cough.     Allergies  Allergen Reactions  . Bee Venom Hives  . Codeine Rash and Swelling  .  Penicillins Rash and Swelling   Previous Medications   ALBUTEROL (PROVENTIL HFA;VENTOLIN HFA) 108 (90 BASE) MCG/ACT INHALER    Inhale 2 puffs into the lungs every 4 (four) hours as needed.   ALENDRONATE (FOSAMAX) 70 MG TABLET    Take 1 tablet by mouth once a week. 30 minutes before first food, beverage   ASPIRIN 81 MG TABLET    Take 1 tablet by mouth daily.   AZELASTINE HCL (ASTEPRO) 0.15 % SOLN    2 sprays into each nostril twice a day   BENZONATATE  (TESSALON) 100 MG CAPSULE    Take 1 capsule (100 mg total) by mouth 3 (three) times daily as needed for cough.   BLOOD GLUCOSE MONITORING SUPPL (ACCU-CHEK COMPACT CARE KIT) KIT    As directed   ESOMEPRAZOLE (NEXIUM) 40 MG CAPSULE    Take 1 capsule (40 mg total) by mouth daily. Take instead of pantoprazole for reflux   FLUTICASONE (FLONASE) 50 MCG/ACT NASAL SPRAY    Place 2 sprays into both nostrils daily.   FUROSEMIDE (LASIX) 20 MG TABLET    Take 1 tablet (20 mg total) by mouth daily as needed (swelling).   GLUCOSE BLOOD (ACCU-CHEK AVIVA PLUS) TEST STRIP    Use to check blood sugar once a day   HYDROCODONE-ACETAMINOPHEN (NORCO/VICODIN) 5-325 MG PER TABLET    Take 1 tablet by mouth every 6 (six) hours as needed.   IPRATROPIUM (ATROVENT) 0.06 % NASAL SPRAY    Place 2 sprays into both nostrils 4 (four) times daily.   LEVOTHYROXINE (SYNTHROID, LEVOTHROID) 75 MCG TABLET    TAKE ONE TABLET BY MOUTH EVERY DAY   LISINOPRIL (PRINIVIL,ZESTRIL) 5 MG TABLET    Take 1 tablet (5 mg total) by mouth daily.   LOVASTATIN (MEVACOR) 40 MG TABLET    Take 1 tablet by mouth at bedtime.   MAGNESIUM OXIDE 500 MG TABS    Take 1 tablet by mouth daily.   METFORMIN (GLUCOPHAGE-XR) 500 MG 24 HR TABLET    TAKE ONE TABLET BY MOUTH EVERY DAY   METHOCARBAMOL (ROBAXIN) 500 MG TABLET    Take 2 tablets (1,000 mg total) by mouth every 6 (six) hours as needed for muscle spasms.   MOMETASONE-FORMOTEROL (DULERA) 200-5 MCG/ACT AERO    Inhale 2 puffs into the lungs 2 (two) times daily.   MONTELUKAST (SINGULAIR) 10 MG TABLET    TAKE ONE TABLET BY MOUTH EVERY DAY   TRAMADOL (ULTRAM) 50 MG TABLET    Take 1-2 tablets (50-100 mg total) by mouth every 8 (eight) hours as needed.   TRIAMCINOLONE LOTION (KENALOG) 0.1 %    Apply 1 application topically 2 (two) times daily.   VITAMIN D, CHOLECALCIFEROL, 400 UNITS CHEW    Chew 1 tablet by mouth daily.    Review of Systems  Constitutional: Negative for fever, chills, appetite change and fatigue.    Respiratory: Positive for shortness of breath. Negative for chest tightness.   Cardiovascular: Positive for leg swelling (left leg). Negative for chest pain and palpitations.  Gastrointestinal: Negative for nausea, vomiting and abdominal pain.  Endocrine: Positive for polydipsia and polyuria.  Musculoskeletal: Positive for back pain.  Skin: Positive for color change (redness in left leg).  Neurological: Negative for dizziness, weakness, light-headedness, numbness and headaches.    Social History  Substance Use Topics  . Smoking status: Former Smoker    Types: Cigarettes    Quit date: 10/06/2001  . Smokeless tobacco: Not on file  . Alcohol Use: No  Objective:   BP 122/72 mmHg  Pulse 93  Temp(Src) 98.2 F (36.8 C) (Oral)  Resp 16  SpO2 95%  Physical Exam   General Appearance:    Alert, cooperative, no distress  Eyes:    PERRL, conjunctiva/corneas clear, EOM's intact       Lungs:     Clear to auscultation bilaterally, respirations unlabored  Heart:    Regular rate and rhythm  Neurologic:   Awake, alert, oriented x 3. No apparent focal neurological           defect.          Results for orders placed or performed in visit on 10/24/15  POCT HgB A1C  Result Value Ref Range   Hemoglobin A1C 6.8    Est. average glucose Bld gHb Est-mCnc 148         Assessment & Plan:     1. Type 2 diabetes mellitus with stage 3 chronic kidney disease, without long-term current use of insulin (HCC) Well controlled.  Continue current medications.   - POCT HgB A1C  2. Low back pain, unspecified back pain laterality, with sciatica presence unspecified  - HYDROcodone-acetaminophen (NORCO/VICODIN) 5-325 MG tablet; Take 1 tablet by mouth every 8 (eight) hours as needed.  Dispense: 30 tablet; Refill: 0  3. Hypothyroidism, unspecified hypothyroidism type  - TSH  4. Essential hypertension Well controlled.  Continue current medications.    5. Rash Not at all painful. Intermittent. Not  currently present.  - CBC - Comprehensive metabolic panel        Lelon Huh, MD  [facility]

## 2015-10-25 LAB — COMPREHENSIVE METABOLIC PANEL
ALT: 22 IU/L (ref 0–32)
AST: 25 IU/L (ref 0–40)
Albumin/Globulin Ratio: 1.8 (ref 1.2–2.2)
Albumin: 4.2 g/dL (ref 3.5–4.8)
Alkaline Phosphatase: 64 IU/L (ref 39–117)
BUN/Creatinine Ratio: 15 (ref 12–28)
BUN: 19 mg/dL (ref 8–27)
Bilirubin Total: 0.3 mg/dL (ref 0.0–1.2)
CO2: 23 mmol/L (ref 18–29)
Calcium: 9.5 mg/dL (ref 8.7–10.3)
Chloride: 98 mmol/L (ref 96–106)
Creatinine, Ser: 1.28 mg/dL — ABNORMAL HIGH (ref 0.57–1.00)
GFR calc Af Amer: 46 mL/min/{1.73_m2} — ABNORMAL LOW (ref >59)
GFR calc non Af Amer: 40 mL/min/{1.73_m2} — ABNORMAL LOW (ref >59)
Globulin, Total: 2.4 g/dL (ref 1.5–4.5)
Glucose: 139 mg/dL — ABNORMAL HIGH (ref 65–99)
Potassium: 4.3 mmol/L (ref 3.5–5.2)
Sodium: 138 mmol/L (ref 134–144)
Total Protein: 6.6 g/dL (ref 6.0–8.5)

## 2015-10-25 LAB — CBC
Hematocrit: 35.8 % (ref 34.0–46.6)
Hemoglobin: 12.1 g/dL (ref 11.1–15.9)
MCH: 30.7 pg (ref 26.6–33.0)
MCHC: 33.8 g/dL (ref 31.5–35.7)
MCV: 91 fL (ref 79–97)
Platelets: 369 10*3/uL (ref 150–379)
RBC: 3.94 x10E6/uL (ref 3.77–5.28)
RDW: 12.8 % (ref 12.3–15.4)
WBC: 7.4 10*3/uL (ref 3.4–10.8)

## 2015-10-25 LAB — TSH: TSH: 2.29 u[IU]/mL (ref 0.450–4.500)

## 2015-11-01 ENCOUNTER — Telehealth: Payer: Self-pay | Admitting: Family Medicine

## 2015-11-01 MED ORDER — METFORMIN HCL ER 500 MG PO TB24: 500.0000 mg | ORAL_TABLET | Freq: Every day | ORAL | Status: DC

## 2015-11-01 NOTE — Telephone Encounter (Signed)
Pt contacted office for refill request on the following medications: metFORMIN (GLUCOPHAGE-XR) 500 MG 24 hr tablet to [facility]. Pt stated she just got her last refill. Please advise. Thanks TNP

## 2015-11-07 ENCOUNTER — Telehealth: Payer: Self-pay | Admitting: Family Medicine

## 2015-11-07 NOTE — Telephone Encounter (Signed)
Yes, but she will need an o.v. To go over forms.

## 2015-11-07 NOTE — Telephone Encounter (Signed)
Pt would like to know if Dr. Sherrie MustacheFisher would be able to complete forms to help pt get a recliner that lifts up and down. Pt stated that she is in a lot of pain trying to get in and out of her recliner chair at home b/c of her back and other health issues. Pt stated that she would come in for an OV if needed but she first wanted to know if Dr. Sherrie MustacheFisher would be able to complete the forms. Please advise. Thanks TNP

## 2015-11-07 NOTE — Telephone Encounter (Signed)
Yes, please make it for 30 minutes. Thanks.

## 2015-11-07 NOTE — Telephone Encounter (Signed)
Patient advised as below. Patient has an appointment already scheduled on 01/18/2016 at 9am for an AWV. She wanted to do the evaluation for the recliner and AWV all at the same time. I advised her that we are not able to do both on the same visit and she would need to schedule a separate appointment for the lift recliner. Patient agreed to schedule a separate appointment. Appointment for lift recliner evaluation has been scheduled for 12/14/2015 at 8:15am. This is a 15 minute appointment. Should I make it 30 minutes? Patient states she is going to have the forms faxed to our office for us to hold on to until the office visit.

## 2015-11-11 ENCOUNTER — Other Ambulatory Visit: Payer: Self-pay | Admitting: Family Medicine

## 2015-11-23 DIAGNOSIS — J310 Chronic rhinitis: Secondary | ICD-10-CM | POA: Diagnosis not present

## 2015-11-23 DIAGNOSIS — J449 Chronic obstructive pulmonary disease, unspecified: Secondary | ICD-10-CM | POA: Diagnosis not present

## 2015-11-23 DIAGNOSIS — R05 Cough: Secondary | ICD-10-CM | POA: Diagnosis not present

## 2015-11-28 DIAGNOSIS — E119 Type 2 diabetes mellitus without complications: Secondary | ICD-10-CM | POA: Diagnosis not present

## 2015-11-28 LAB — HM DIABETES EYE EXAM

## 2015-11-29 ENCOUNTER — Encounter: Payer: Self-pay | Admitting: *Deleted

## 2015-12-14 ENCOUNTER — Encounter: Payer: Self-pay | Admitting: Family Medicine

## 2015-12-14 ENCOUNTER — Ambulatory Visit (INDEPENDENT_AMBULATORY_CARE_PROVIDER_SITE_OTHER): Payer: Medicare Other | Admitting: Family Medicine

## 2015-12-14 VITALS — BP 112/60 | HR 90 | Temp 97.9°F | Resp 16

## 2015-12-14 DIAGNOSIS — Z7409 Other reduced mobility: Secondary | ICD-10-CM

## 2015-12-14 DIAGNOSIS — J449 Chronic obstructive pulmonary disease, unspecified: Secondary | ICD-10-CM | POA: Diagnosis not present

## 2015-12-14 DIAGNOSIS — M25559 Pain in unspecified hip: Secondary | ICD-10-CM

## 2015-12-14 DIAGNOSIS — I10 Essential (primary) hypertension: Secondary | ICD-10-CM

## 2015-12-14 DIAGNOSIS — M810 Age-related osteoporosis without current pathological fracture: Secondary | ICD-10-CM | POA: Diagnosis not present

## 2015-12-14 DIAGNOSIS — Z89511 Acquired absence of right leg below knee: Secondary | ICD-10-CM

## 2015-12-14 MED ORDER — LISINOPRIL 5 MG PO TABS: 5.0000 mg | ORAL_TABLET | Freq: Every day | ORAL | Status: DC

## 2015-12-14 MED ORDER — PREDNISONE 10 MG PO TABS
ORAL_TABLET | ORAL | Status: AC
Start: 2015-12-14 — End: 2015-12-20

## 2015-12-14 MED ORDER — FUROSEMIDE 20 MG PO TABS: 20.0000 mg | ORAL_TABLET | Freq: Every day | ORAL | Status: DC | PRN

## 2015-12-14 MED ORDER — MONTELUKAST SODIUM 10 MG PO TABS: 10.0000 mg | ORAL_TABLET | Freq: Every day | ORAL | Status: DC

## 2015-12-14 NOTE — Progress Notes (Signed)
Patient: Kayla Boyle Female    DOB: 1934-08-09   81 y.o.   MRN: [id number] Visit Date: 12/14/2015  Today's Provider: Lelon Huh, MD   Chief Complaint  Patient presents with  . Leg Pain    Face to Face evaluation for Lift Chair   Subjective:    HPI Leg pain and Back pain: Patient has been having pain in her legs and back when getting in and out of her recliner at home. He has history of multiple orthopedic conditions and is s/p BKA of right leg. She is no longer able to get out of chair without assistance and would like lift chair for assistance.    Allergies  Allergen Reactions  . Bee Venom Hives  . Codeine Rash and Swelling  . Penicillins Rash and Swelling   Current Meds  Medication Sig  . albuterol (PROVENTIL HFA;VENTOLIN HFA) 108 (90 BASE) MCG/ACT inhaler Inhale 2 puffs into the lungs every 4 (four) hours as needed.  Marland Kitchen alendronate (FOSAMAX) 70 MG tablet TAKE 1 TABLET EVERY WEEK 30 MINUTES BEFORE FIRST FOOD AND BEVERAGE  . aspirin 81 MG tablet Take 1 tablet by mouth daily.  . Azelastine HCl (ASTEPRO) 0.15 % SOLN 2 sprays into each nostril twice a day  . Blood Glucose Monitoring Suppl (ACCU-CHEK COMPACT CARE KIT) KIT As directed  . esomeprazole (NEXIUM) 40 MG capsule Take 1 capsule (40 mg total) by mouth daily. Take instead of pantoprazole for reflux  . fluticasone (FLONASE) 50 MCG/ACT nasal spray Place 2 sprays into both nostrils daily.  . furosemide (LASIX) 20 MG tablet Take 1 tablet (20 mg total) by mouth daily as needed (swelling).  Marland Kitchen glucose blood (ACCU-CHEK AVIVA PLUS) test strip Use to check blood sugar once a day  . HYDROcodone-acetaminophen (NORCO/VICODIN) 5-325 MG tablet Take 1 tablet by mouth every 8 (eight) hours as needed.  Marland Kitchen ipratropium (ATROVENT) 0.06 % nasal spray Place 2 sprays into both nostrils 4 (four) times daily.  Marland Kitchen levothyroxine (SYNTHROID, LEVOTHROID) 75 MCG tablet TAKE ONE TABLET BY MOUTH EVERY DAY  . lisinopril (PRINIVIL,ZESTRIL) 5 MG tablet  Take 1 tablet (5 mg total) by mouth daily.  Marland Kitchen lovastatin (MEVACOR) 40 MG tablet Take 1 tablet by mouth at bedtime.  . Magnesium Oxide 500 MG TABS Take 1 tablet by mouth daily.  . metFORMIN (GLUCOPHAGE-XR) 500 MG 24 hr tablet Take 1 tablet (500 mg total) by mouth daily.  . methocarbamol (ROBAXIN) 500 MG tablet Take 2 tablets (1,000 mg total) by mouth every 6 (six) hours as needed for muscle spasms.  . mometasone-formoterol (DULERA) 200-5 MCG/ACT AERO Inhale 2 puffs into the lungs 2 (two) times daily.  . montelukast (SINGULAIR) 10 MG tablet TAKE ONE TABLET BY MOUTH EVERY DAY  . traMADol (ULTRAM) 50 MG tablet Take 1-2 tablets (50-100 mg total) by mouth every 8 (eight) hours as needed.  . triamcinolone lotion (KENALOG) 0.1 % Apply 1 application topically 2 (two) times daily.  . Vitamin D, Cholecalciferol, 400 UNITS CHEW Chew 1 tablet by mouth daily.    Review of Systems  Constitutional: Negative for fever, chills, appetite change and fatigue.  HENT: Positive for postnasal drip, rhinorrhea and sneezing.   Eyes: Positive for discharge (watery eyes).  Respiratory: Negative for chest tightness and shortness of breath.   Cardiovascular: Negative for chest pain and palpitations.  Gastrointestinal: Negative for nausea, vomiting and abdominal pain.  Musculoskeletal: Positive for back pain. Myalgias: left pain.  Neurological: Negative for dizziness and  weakness.    Social History  Substance Use Topics  . Smoking status: Former Smoker    Types: Cigarettes    Quit date: 10/06/2001  . Smokeless tobacco: Not on file  . Alcohol Use: No   Objective:   BP 112/60 mmHg  Pulse 90  Temp(Src) 97.9 F (36.6 C) (Oral)  Resp 16  SpO2 95%  Physical Exam  General appearance: alert, well developed, well nourished, cooperative and in no distress Head: Normocephalic, without obvious abnormality, atraumatic Respiratory: Respirations even and unlabored, normal respiratory rate Extremities: No gross  deformities Skin: Skin color, texture, turgor normal. No rashes seen  Psych: Appropriate mood and affect. Neurologic: Mental status: Alert, oriented to person, place, and time, thought content appropriate. MS: s/p right BKA. +3 LE muscle strength. Unable to get out of wheelchair without assistance.     Assessment & Plan:     1. Essential hypertension Well controlled.  Needs refill - lisinopril (PRINIVIL,ZESTRIL) 5 MG tablet; Take 1 tablet (5 mg total) by mouth daily.  Dispense: 90 tablet; Refill: 1  2. Arthralgia of hip, unspecified laterality   3. COPD, moderate ([place]) stable  4. Mobility impaired   5. OP (osteoporosis)   6. Status post below knee amputation of right lower extremity ([place])  She is no longer able to get out of chair without assistance and requires lift chair.      Lelon Huh, MD  [facility]

## 2016-01-01 DIAGNOSIS — J449 Chronic obstructive pulmonary disease, unspecified: Secondary | ICD-10-CM | POA: Diagnosis not present

## 2016-01-18 ENCOUNTER — Ambulatory Visit (INDEPENDENT_AMBULATORY_CARE_PROVIDER_SITE_OTHER): Payer: Medicare Other | Admitting: Family Medicine

## 2016-01-18 ENCOUNTER — Encounter: Payer: Self-pay | Admitting: Family Medicine

## 2016-01-18 VITALS — BP 118/64 | HR 78 | Temp 97.5°F | Resp 18 | Ht 69.0 in | Wt 159.0 lb

## 2016-01-18 DIAGNOSIS — Z0000 Encounter for general adult medical examination without abnormal findings: Secondary | ICD-10-CM | POA: Diagnosis not present

## 2016-01-18 DIAGNOSIS — Z1211 Encounter for screening for malignant neoplasm of colon: Secondary | ICD-10-CM | POA: Diagnosis not present

## 2016-01-18 DIAGNOSIS — Z89511 Acquired absence of right leg below knee: Secondary | ICD-10-CM | POA: Diagnosis not present

## 2016-01-18 DIAGNOSIS — M810 Age-related osteoporosis without current pathological fracture: Secondary | ICD-10-CM | POA: Diagnosis not present

## 2016-01-18 DIAGNOSIS — I10 Essential (primary) hypertension: Secondary | ICD-10-CM | POA: Diagnosis not present

## 2016-01-18 MED ORDER — ALENDRONATE SODIUM 70 MG PO TABS
ORAL_TABLET | ORAL | Status: DC
Start: 2016-01-18 — End: 2017-02-08

## 2016-01-18 MED ORDER — MONTELUKAST SODIUM 10 MG PO TABS: 10.0000 mg | ORAL_TABLET | Freq: Every day | ORAL | Status: DC

## 2016-01-18 NOTE — Progress Notes (Signed)
Patient: Kayla Boyle, Female    DOB: Oct 19, 1934, 81 y.o.   MRN: [id number] Visit Date: 01/18/2016  Today's Provider: Lelon Huh, MD   Chief Complaint  Patient presents with  . Annual Exam  . Hypertension    follow up  . COPD    follow up  . Diabetes    follow up  . Hypothyroidism    follow up  . Back Pain    follow up   Subjective:    Annual physical  Kayla Boyle is a 81 y.o. female. She feels fairly well. She reports she is not exercising. She reports she is sleeping poorly.  -----------------------------------------------------------  Hypertension, follow-up:  BP Readings from Last 3 Encounters:  01/18/16 118/64  12/14/15 112/60  10/24/15 122/72    She was last seen for hypertension 1 months ago.  BP at that visit was 112/60. Management since that visit includes none. She reports good compliance with treatment. She is not having side effects.  She is not exercising. She is adherent to low salt diet.   Outside blood pressures are not being checked. She is experiencing none.  Patient denies chest pain, chest pressure/discomfort, claudication, exertional chest pressure/discomfort, fatigue, irregular heart beat, lower extremity edema, near-syncope, orthopnea, palpitations, paroxysmal nocturnal dyspnea and syncope.   Cardiovascular risk factors include advanced age (older than 16 for men, 63 for women), diabetes mellitus, dyslipidemia, hypertension and smoking/ tobacco exposure.   Wt Readings from Last 3 Encounters:  01/18/16 159 lb (72.122 kg)  01/02/15 159 lb (72.122 kg)  09/01/14 162 lb (73.483 kg)    ------------------------------------------------------------------------  Follow up COPD: Patient was last seen 1 month ago and no changes were made. She states Singulair is working well and requests 90 day prescription refill.   ------------------------------------------------------------------------  Follow up Osteoporosis Continue on Fosamax  once a week and reports she is tolerating it well.   Review of Systems  Constitutional: Negative.   HENT: Negative.   Eyes: Negative.   Respiratory: Positive for shortness of breath.   Cardiovascular: Negative.   Gastrointestinal: Negative.   Endocrine: Negative.   Genitourinary: Negative.   Musculoskeletal: Positive for back pain.  Skin: Negative.   Allergic/Immunologic: Negative.   Neurological: Negative.   Hematological: Negative.   Psychiatric/Behavioral: Negative.     Social History   Social History  . Marital Status: Single    Spouse Name: N/A  . Number of Children: N/A  . Years of Education: 12   Occupational History  . Disabled   . retired    Social History Main Topics  . Smoking status: Former Smoker    Types: Cigarettes    Quit date: 10/06/2001  . Smokeless tobacco: Not on file  . Alcohol Use: No  . Drug Use: No  . Sexual Activity: Not on file   Other Topics Concern  . Not on file   Social History Narrative    Past Medical History  Diagnosis Date  . History of chicken pox   . Shingles   . H/O acute poliomyelitis 11/24/2014     Patient Active Problem List   Diagnosis Date Noted  . Low back pain 10/24/2015  . Chronic cough 08/24/2015  . Cough 06/20/2015  . Allergic rhinitis 02/09/2015  . Arthritis of shoulder region, right 01/02/2015  . Arthritis 11/24/2014  . Chronic kidney disease (CKD), stage III (moderate) 11/24/2014  . Club foot 11/24/2014  . Diabetes mellitus with renal complications ([place]) 53/61/4431  . Eczema of  hand 11/24/2014  . GERD (gastroesophageal reflux disease) 11/24/2014  . Arthralgia of hip 11/24/2014  . Hyperlipidemia 11/24/2014  . Hypertension 11/24/2014  . Hypomagnesemia 11/24/2014  . Hypothyroid 11/24/2014  . IBS (irritable bowel syndrome) 11/24/2014  . Mobility impaired 11/24/2014  . Restless leg 11/24/2014  . Restrictive lung disease 11/24/2014  . Status post below knee amputation ([place]) 11/24/2014  . Scoliosis  11/24/2014  . Vitamin D deficiency 11/24/2014  . COPD, moderate ([place]) 12/16/2013  . OP (osteoporosis) 09/02/2011    Past Surgical History  Procedure Laterality Date  . Cholecystectomy  10/2003    laproscopic  . Leg amputation below knee Right 1975    traumatic; bleow knee and club foot  . Abdominal hysterectomy  1963  . Appendectomy    . Elbow surgery Bilateral   . Hand surgery  01/2010  . Carpal tunnel release      Her family history includes Cancer in her son; Cancer (age of onset: 58) in her other; Diabetes in her brother; HIV/AIDS in her son; Hepatitis C in her daughter; Pancreatic cancer in her father; Prostate cancer in her brother.    Current Meds  Medication Sig  . albuterol (PROVENTIL HFA;VENTOLIN HFA) 108 (90 BASE) MCG/ACT inhaler Inhale 2 puffs into the lungs every 4 (four) hours as needed.  Marland Kitchen alendronate (FOSAMAX) 70 MG tablet TAKE 1 TABLET EVERY WEEK 30 MINUTES BEFORE FIRST FOOD AND BEVERAGE  . aspirin 81 MG tablet Take 1 tablet by mouth daily.  . Azelastine HCl (ASTEPRO) 0.15 % SOLN 2 sprays into each nostril twice a day  . Blood Glucose Monitoring Suppl (ACCU-CHEK COMPACT CARE KIT) KIT As directed  . esomeprazole (NEXIUM) 40 MG capsule Take 1 capsule (40 mg total) by mouth daily. Take instead of pantoprazole for reflux  . fluticasone (FLONASE) 50 MCG/ACT nasal spray Place 2 sprays into both nostrils daily.  . furosemide (LASIX) 20 MG tablet Take 1 tablet (20 mg total) by mouth daily as needed (swelling).  Marland Kitchen glucose blood (ACCU-CHEK AVIVA PLUS) test strip Use to check blood sugar once a day  . HYDROcodone-acetaminophen (NORCO/VICODIN) 5-325 MG tablet Take 1 tablet by mouth every 8 (eight) hours as needed.  Marland Kitchen ipratropium (ATROVENT) 0.06 % nasal spray Place 2 sprays into both nostrils 4 (four) times daily.  Marland Kitchen levothyroxine (SYNTHROID, LEVOTHROID) 75 MCG tablet TAKE ONE TABLET BY MOUTH EVERY DAY  . lisinopril (PRINIVIL,ZESTRIL) 5 MG tablet Take 1 tablet (5 mg total) by  mouth daily.  Marland Kitchen lovastatin (MEVACOR) 40 MG tablet Take 1 tablet by mouth at bedtime.  . Magnesium Oxide 500 MG TABS Take 1 tablet by mouth daily.  . metFORMIN (GLUCOPHAGE-XR) 500 MG 24 hr tablet Take 1 tablet (500 mg total) by mouth daily.  . methocarbamol (ROBAXIN) 500 MG tablet Take 2 tablets (1,000 mg total) by mouth every 6 (six) hours as needed for muscle spasms.  . mometasone-formoterol (DULERA) 200-5 MCG/ACT AERO Inhale 2 puffs into the lungs 2 (two) times daily.  . montelukast (SINGULAIR) 10 MG tablet Take 1 tablet (10 mg total) by mouth daily.  . traMADol (ULTRAM) 50 MG tablet Take 1-2 tablets (50-100 mg total) by mouth every 8 (eight) hours as needed.  . triamcinolone lotion (KENALOG) 0.1 % Apply 1 application topically 2 (two) times daily.  . Vitamin D, Cholecalciferol, 400 UNITS CHEW Chew 1 tablet by mouth daily.    Patient Care Team: Birdie Sons, MD as PCP - General (Family Medicine) Erby Pian, MD as Referring Physician (  Pulmonary Disease) Emmaline Kluver., MD as Consulting Physician (Rheumatology) Manya Silvas, MD as Consulting Physician (Gastroenterology) Gaynelle Arabian, MD as Consulting Physician (Orthopedic Surgery) Christophe Louis, MD as Referring Physician (Specialist)    Objective:   Vitals: BP 118/64 mmHg  Pulse 78  Temp(Src) 97.5 F (36.4 C) (Oral)  Resp 18  Ht _0  (1.753 m)  Wt 159 lb (72.122 kg)  BMI 23.47 kg/m2  Physical Exam   General Appearance:    Alert, cooperative, no distress, appears stated age, obese  Head:    Normocephalic, without obvious abnormality, atraumatic  Eyes:    PERRL, conjunctiva/corneas clear, EOM's intact, fundi    benign, both eyes  Ears:    Normal TM's and external ear canals, both ears  Nose:   Nares normal, septum midline, mucosa normal, no drainage    or sinus tenderness  Throat:   Lips, mucosa, and tongue normal; teeth and gums normal  Neck:   Supple, symmetrical, trachea midline, no adenopathy;     thyroid:  no enlargement/tenderness/nodules; no carotid   bruit or JVD  Back:     Symmetric, no curvature, ROM normal, no CVA tenderness  Lungs:     Clear to auscultation bilaterally, respirations unlabored  Chest Wall:    No tenderness or deformity   Heart:    Regular rate and rhythm, S1 and S2 normal, no murmur, rub   or gallop  Breast Exam:    refused  Abdomen:     Soft, non-tender, bowel sounds active all four quadrants,    no masses, no organomegaly  Pelvic:    deferred  Extremities:   Extremities normal, s/p right BKA, no cyanosis or edema  Pulses:   2+ and symmetric all extremities  Skin:   Skin color, texture, turgor normal, no rashes or lesions  Lymph nodes:   Cervical, supraclavicular, and axillary nodes normal  Neurologic:   CNII-XII intact, normal strength, sensation and reflexes    throughout    Activities of Daily Living In your present state of health, do you have any difficulty performing the following activities: 01/18/2016 01/18/2016  Hearing? N N  Vision? N N  Difficulty concentrating or making decisions? N N  Walking or climbing stairs? Y Y  Dressing or bathing? N Y  Doing errands, shopping? N Y    Fall Risk Assessment Fall Risk  01/18/2016 01/18/2016 01/02/2015  Falls in the past year? No No No     Depression Screen PHQ 2/9 Scores 01/18/2016 01/18/2016 01/02/2015  PHQ - 2 Score 1 0 0    Cognitive Testing - 6-CIT  Correct? Score   What year is it? yes 0 0 or 4  What month is it? yes 0 0 or 3  Memorize:    Pia Mau,  44,  [place],      What time is it? (within 1 hour) yes 0 0 or 3  Count backwards from 20 yes 0 0, 2, or 4  Name the months of the year yes 0 0, 2, or 4  Repeat name & address above no 5 0, 2, 4, 6, 8, or 10       TOTAL SCORE  5/28   Interpretation:  Normal  Normal (0-7) Abnormal (8-28)       Assessment & Plan:     Annual Physical  Reviewed patient's Family Medical History Reviewed and updated list of patient's  medical providers Assessment of cognitive impairment was done Assessed patient's functional  ability Established a written schedule for health screening [place] Completed and Reviewed  Exercise Activities and Dietary recommendations Goals    None      Immunization History  Administered Date(s) Administered  . Influenza, High Dose Seasonal PF 03/23/2015  . Pneumococcal Conjugate-13 08/26/2013  . Pneumococcal Polysaccharide-23 06/13/2005  . Td 02/24/2008  . Tdap 03/26/2011  . Zoster 07/19/2010    Health Maintenance  Topic Date Due  . FOOT EXAM  07/12/1946  . INFLUENZA VACCINE  02/06/2016  . HEMOGLOBIN A1C  04/24/2016  . OPHTHALMOLOGY EXAM  11/27/2016  . TETANUS/TDAP  03/25/2021  . DEXA SCAN  Completed  . ZOSTAVAX  Completed  . PNA vac Low Risk Adult  Completed      Discussed health benefits of physical activity, and encouraged her to engage in regular exercise appropriate for her age and condition.    ------------------------------------------------------------------------------------------------------------  1. Annual physical exam Generally doing well. Refused breast exam and mammogram  2. Essential hypertension Well controlled.  Continue current medications.   - EKG 12-Lead  3. Colon cancer screening  - Cologuard  4. OP (osteoporosis) Doing well on Fosamax weekly.   5. Status post below knee amputation of right lower extremity ([place]) Doing well with prosthesis.    Lelon Huh, MD  [facility]

## 2016-01-19 ENCOUNTER — Telehealth: Payer: Self-pay | Admitting: Family Medicine

## 2016-01-19 NOTE — Telephone Encounter (Signed)
Order for cologuard faxed to [facility] °

## 2016-02-09 NOTE — Telephone Encounter (Signed)
error 

## 2016-03-04 ENCOUNTER — Telehealth: Payer: Self-pay

## 2016-03-04 MED ORDER — MECLIZINE HCL 25 MG PO TABS: 25.0000 mg | ORAL_TABLET | Freq: Three times a day (TID) | ORAL | 0 refills | Status: DC | PRN

## 2016-03-04 NOTE — Telephone Encounter (Signed)
Patient reports that she has had dizziness for the last 3 days. She reports that it feels like the room is spinning, and her symptoms are worse when she bends over then looks up. Patient reports that it is hard for her to walk and she has to stay in her recliner because the dizziness is so bad. Patient is requesting that something be called in to help with her symptoms. [facility] uses [facility]. Contact information is correct. Thanks!

## 2016-03-04 NOTE — Telephone Encounter (Signed)
[facility]. Need to go to ER if any fever, headaches or vomiting. O.v. Is meclizine doesn't help.

## 2016-03-04 NOTE — Telephone Encounter (Signed)
Tried calling patient twice and number was busy. Will try again later.  

## 2016-03-04 NOTE — Telephone Encounter (Signed)
Advised patient as below.  

## 2016-03-07 ENCOUNTER — Telehealth: Payer: Self-pay | Admitting: Family Medicine

## 2016-03-07 NOTE — Telephone Encounter (Signed)
Please advise 

## 2016-03-07 NOTE — Telephone Encounter (Signed)
Pt is asking if she can get a higher does for the Rx meclizine (ANTIVERT) 25 MG tablet.  Pt states she is still having dizziness at night when turn over in the bed.  [facility].  CB#[phone]/MW

## 2016-03-08 NOTE — Telephone Encounter (Signed)
Patient was notified. Patient stated thank you but that she will see ENT on her on, she does not need a referral.

## 2016-03-08 NOTE — Telephone Encounter (Signed)
 25mg  is the highest dose. If she is not improving then she needs referral to ENT

## 2016-03-13 DIAGNOSIS — Z1212 Encounter for screening for malignant neoplasm of rectum: Secondary | ICD-10-CM | POA: Diagnosis not present

## 2016-03-13 DIAGNOSIS — Z1211 Encounter for screening for malignant neoplasm of colon: Secondary | ICD-10-CM | POA: Diagnosis not present

## 2016-03-13 LAB — COLOGUARD: Cologuard: NEGATIVE

## 2016-03-22 ENCOUNTER — Telehealth: Payer: Self-pay

## 2016-03-22 ENCOUNTER — Encounter: Payer: Self-pay | Admitting: Family Medicine

## 2016-03-22 NOTE — Telephone Encounter (Signed)
Patient advised as below.  

## 2016-03-22 NOTE — Telephone Encounter (Signed)
-----   Message from Malva Limesonald E Fisher, MD sent at 03/22/2016  7:43 AM EDT ----- Please advise Cologuard is negative. No future testing is recommended

## 2016-04-11 ENCOUNTER — Ambulatory Visit (INDEPENDENT_AMBULATORY_CARE_PROVIDER_SITE_OTHER): Payer: Medicare Other | Admitting: Family Medicine

## 2016-04-11 ENCOUNTER — Encounter: Payer: Self-pay | Admitting: Family Medicine

## 2016-04-11 ENCOUNTER — Other Ambulatory Visit: Payer: Self-pay | Admitting: Family Medicine

## 2016-04-11 VITALS — BP 120/74 | HR 89 | Temp 98.2°F | Resp 16 | Ht 69.0 in

## 2016-04-11 DIAGNOSIS — I10 Essential (primary) hypertension: Secondary | ICD-10-CM

## 2016-04-11 DIAGNOSIS — Z23 Encounter for immunization: Secondary | ICD-10-CM

## 2016-04-11 DIAGNOSIS — J449 Chronic obstructive pulmonary disease, unspecified: Secondary | ICD-10-CM | POA: Diagnosis not present

## 2016-04-11 DIAGNOSIS — R05 Cough: Secondary | ICD-10-CM | POA: Diagnosis not present

## 2016-04-11 DIAGNOSIS — K219 Gastro-esophageal reflux disease without esophagitis: Secondary | ICD-10-CM | POA: Diagnosis not present

## 2016-04-11 DIAGNOSIS — R053 Chronic cough: Secondary | ICD-10-CM

## 2016-04-11 DIAGNOSIS — E1129 Type 2 diabetes mellitus with other diabetic kidney complication: Secondary | ICD-10-CM

## 2016-04-11 LAB — POCT GLYCOSYLATED HEMOGLOBIN (HGB A1C)
Est. average glucose Bld gHb Est-mCnc: 140
Hemoglobin A1C: 6.5

## 2016-04-11 MED ORDER — PREDNISONE 10 MG PO TABS: ORAL_TABLET | ORAL | 0 refills | Status: DC

## 2016-04-11 MED ORDER — LISINOPRIL 5 MG PO TABS: 5.0000 mg | ORAL_TABLET | Freq: Every day | ORAL | 3 refills | Status: DC

## 2016-04-11 MED ORDER — MECLIZINE HCL 25 MG PO TABS: 25.0000 mg | ORAL_TABLET | Freq: Three times a day (TID) | ORAL | 3 refills | Status: DC | PRN

## 2016-04-11 MED ORDER — LOVASTATIN 40 MG PO TABS: 40.0000 mg | ORAL_TABLET | Freq: Every day | ORAL | 4 refills | Status: DC

## 2016-04-11 MED ORDER — FUROSEMIDE 20 MG PO TABS: 20.0000 mg | ORAL_TABLET | Freq: Every day | ORAL | 6 refills | Status: DC | PRN

## 2016-04-11 NOTE — Telephone Encounter (Signed)
Pt was just in to see Dr. Sherrie MustacheFisher.  She just recd her medications from the pharmacy and she was missingthese prescriptions  Levothyroxine 75mcg Metformin 500mg  Meclizine 25mg  esomeprazole 40mg   [facility]  Pt's call back is [phone]  Thanks Barth Kirkseri

## 2016-04-11 NOTE — Progress Notes (Signed)
Patient: Kayla Boyle Female    DOB: May 29, 1935   81 y.o.   MRN: [id number] Visit Date: 04/11/2016  Today's Provider: Lelon Huh, MD   Chief Complaint  Patient presents with  . Follow-up  . Diabetes  . COPD  . Hypertension   Subjective:    HPI  COPD, moderate: From 12/14/2015-no changes.   Diabetes Mellitus Type II, Follow-up:   Lab Results  Component Value Date   HGBA1C 6.5 04/11/2016   HGBA1C 6.8 10/24/2015   HGBA1C 6.1 (H) 04/13/2015   Last seen for diabetes 5 months ago.  Management since then includes; no changes. She reports good compliance with treatment. She is not having side effects. none Current symptoms include none and have been unchanged. Home blood sugar records: fasting range: 118  Episodes of hypoglycemia? no   Current Insulin Regimen: n/a Most Recent Eye Exam: 11/28/15 Weight trend: stable Prior visit with dietician: no Current diet: well balanced Current exercise: none  ----------------------------------------------------------------   Hypertension, follow-up:  BP Readings from Last 3 Encounters:  04/11/16 120/74  01/18/16 118/64  12/14/15 112/60    She was last seen for hypertension 2 months ago.  BP at that visit was 118/64. Management since that visit includes; no chnages.She reports good compliance with treatment. She is not having side effects. none She is not exercising. She is adherent to low salt diet.   Outside blood pressures are 120/60. She is experiencing none.  Patient denies none.   Cardiovascular risk factors include diabetes mellitus.  Use of agents associated with hypertension: none.   ----------------------------------------------------------------  Started having cough again which is persistent and lasts all day long, keeps her up at night. No heartburn. Cough has not improved with Singular. Inhalers don't help. Has follow up with Dr. Raul Del next month. Has also has persistent runny nose and itchy  eyes. Has a history of heartburn with GERD, but hearburn has completely resolved since being on Nexium  Allergies  Allergen Reactions  . Bee Venom Hives  . Codeine Rash and Swelling  . Penicillins Rash and Swelling     Current Outpatient Prescriptions:  .  albuterol (PROVENTIL HFA;VENTOLIN HFA) 108 (90 BASE) MCG/ACT inhaler, Inhale 2 puffs into the lungs every 4 (four) hours as needed., Disp: , Rfl:  .  alendronate (FOSAMAX) 70 MG tablet, TAKE 1 TABLET EVERY WEEK 30 MINUTES BEFORE FIRST FOOD AND BEVERAGE, Disp: 12 tablet, Rfl: 4 .  aspirin 81 MG tablet, Take 1 tablet by mouth daily., Disp: , Rfl:  .  Azelastine HCl (ASTEPRO) 0.15 % SOLN, 2 sprays into each nostril twice a day, Disp: 30 mL, Rfl: 1 .  Blood Glucose Monitoring Suppl (ACCU-CHEK COMPACT CARE KIT) KIT, As directed, Disp: 1 each, Rfl: 0 .  esomeprazole (NEXIUM) 40 MG capsule, Take 1 capsule (40 mg total) by mouth daily. Take instead of pantoprazole for reflux, Disp: 90 capsule, Rfl: 3 .  fluticasone (FLONASE) 50 MCG/ACT nasal spray, Place 2 sprays into both nostrils daily., Disp: 16 g, Rfl: 4 .  furosemide (LASIX) 20 MG tablet, Take 1 tablet (20 mg total) by mouth daily as needed (swelling)., Disp: 30 tablet, Rfl: 3 .  glucose blood (ACCU-CHEK AVIVA PLUS) test strip, Use to check blood sugar once a day, Disp: 100 each, Rfl: 4 .  HYDROcodone-acetaminophen (NORCO/VICODIN) 5-325 MG tablet, Take 1 tablet by mouth every 8 (eight) hours as needed., Disp: 30 tablet, Rfl: 0 .  ipratropium (ATROVENT) 0.06 % nasal spray, Place  2 sprays into both nostrils 4 (four) times daily., Disp: 15 mL, Rfl: 3 .  levothyroxine (SYNTHROID, LEVOTHROID) 75 MCG tablet, TAKE ONE TABLET BY MOUTH EVERY DAY, Disp: 180 tablet, Rfl: 4 .  lisinopril (PRINIVIL,ZESTRIL) 5 MG tablet, Take 1 tablet (5 mg total) by mouth daily., Disp: 90 tablet, Rfl: 1 .  lovastatin (MEVACOR) 40 MG tablet, Take 1 tablet by mouth at bedtime., Disp: , Rfl:  .  Magnesium Oxide 500 MG TABS,  Take 1 tablet by mouth daily., Disp: , Rfl:  .  meclizine (ANTIVERT) 25 MG tablet, Take 1 tablet (25 mg total) by mouth 3 (three) times daily as needed for dizziness., Disp: 30 tablet, Rfl: 0 .  metFORMIN (GLUCOPHAGE-XR) 500 MG 24 hr tablet, Take 1 tablet (500 mg total) by mouth daily., Disp: 90 tablet, Rfl: 4 .  methocarbamol (ROBAXIN) 500 MG tablet, Take 2 tablets (1,000 mg total) by mouth every 6 (six) hours as needed for muscle spasms., Disp: 60 tablet, Rfl: 1 .  mometasone-formoterol (DULERA) 200-5 MCG/ACT AERO, Inhale 2 puffs into the lungs 2 (two) times daily., Disp: 8.8 g, Rfl: 0 .  montelukast (SINGULAIR) 10 MG tablet, Take 1 tablet (10 mg total) by mouth daily., Disp: 90 tablet, Rfl: 4 .  traMADol (ULTRAM) 50 MG tablet, Take 1-2 tablets (50-100 mg total) by mouth every 8 (eight) hours as needed., Disp: 1 tablet, Rfl: 1 .  triamcinolone lotion (KENALOG) 0.1 %, Apply 1 application topically 2 (two) times daily., Disp: , Rfl:  .  Vitamin D, Cholecalciferol, 400 UNITS CHEW, Chew 1 tablet by mouth daily., Disp: , Rfl:   Review of Systems  Constitutional: Negative for appetite change, chills, fatigue and fever.  Respiratory: Positive for cough. Negative for chest tightness and shortness of breath.   Cardiovascular: Negative for chest pain and palpitations.  Gastrointestinal: Negative for abdominal pain, nausea and vomiting.  Neurological: Negative for dizziness and weakness.    Social History  Substance Use Topics  . Smoking status: Former Smoker    Types: Cigarettes    Quit date: 10/06/2001  . Smokeless tobacco: Not on file  . Alcohol use No   Objective:   BP 120/74 (BP Location: Right Arm, Patient Position: Sitting, Cuff Size: Normal)   Pulse 89   Temp 98.2 F (36.8 C) (Oral)   Resp 16   Ht 5' 9"  (1.753 m)   SpO2 95%   Physical Exam   General Appearance:    Alert, cooperative, no distress  Eyes:    PERRL, conjunctiva/corneas clear, EOM's intact       ENT:   Mild nasal  congestion. Mild drainage.   Lungs:     Clear to auscultation bilaterally, respirations unlabored  Heart:    Regular rate and rhythm  Neurologic:   Awake, alert, oriented x 3. No apparent focal neurological           defect.           Assessment & Plan:     1. Type 2 diabetes mellitus with other diabetic kidney complication, without long-term current use of insulin (HCC) Well controlled.  Continue current medications.   - POCT glycosylated hemoglobin (Hb A1C)  2. COPD, moderate ([place]) Stable. Follow up Dr. Raul Del next month as scheduled.   3. Chronic cough Likely secondary to allergies. Responded well to Prednisone in the past.Sent in new rx for 12 day taper.    4. Need for influenza vaccination  - Flu vaccine HIGH DOSE PF  5. Gastroesophageal  reflux disease, esophagitis presence not specified Doing well on Nexium. Continue current medications.    6. Essential hypertension Well controlled.  Continue current medications.   - lisinopril (PRINIVIL,ZESTRIL) 5 MG tablet; Take 1 tablet (5 mg total) by mouth daily.  Dispense: 90 tablet; Refill: 3       Lelon Huh, MD  [facility]

## 2016-04-11 NOTE — Telephone Encounter (Signed)
She should already have refills on all of those except the meclizine which I sent refill for. Please call her pharmacy to make sure. Is she has any prescription with no additional refill then it is ok to approve 90 day with 3 refills.

## 2016-05-13 ENCOUNTER — Telehealth: Payer: Self-pay | Admitting: Family Medicine

## 2016-05-13 NOTE — Telephone Encounter (Signed)
Please advise 

## 2016-05-13 NOTE — Telephone Encounter (Signed)
Pt would like something called in for her sciatic never pain.  [facility]  Pt call back is [phone]  Thanks  Barth Kirkseri

## 2016-05-14 MED ORDER — TRAMADOL HCL 50 MG PO TABS: 50.0000 mg | ORAL_TABLET | Freq: Three times a day (TID) | ORAL | 1 refills | Status: DC | PRN

## 2016-05-14 NOTE — Telephone Encounter (Signed)
Prescription has been called into pharmacy. KW 

## 2016-05-14 NOTE — Telephone Encounter (Signed)
Can call in refill for tramadol

## 2016-05-14 NOTE — Telephone Encounter (Signed)
Pt called back inquiring about the pain med for her sciatica nerve pain.  Please advise.

## 2016-05-16 ENCOUNTER — Other Ambulatory Visit: Payer: Self-pay | Admitting: Family Medicine

## 2016-05-16 DIAGNOSIS — J310 Chronic rhinitis: Secondary | ICD-10-CM | POA: Diagnosis not present

## 2016-05-16 DIAGNOSIS — J449 Chronic obstructive pulmonary disease, unspecified: Secondary | ICD-10-CM | POA: Diagnosis not present

## 2016-05-16 DIAGNOSIS — I10 Essential (primary) hypertension: Secondary | ICD-10-CM

## 2016-05-16 DIAGNOSIS — R0602 Shortness of breath: Secondary | ICD-10-CM | POA: Diagnosis not present

## 2016-05-16 DIAGNOSIS — R05 Cough: Secondary | ICD-10-CM | POA: Diagnosis not present

## 2016-05-16 MED ORDER — LISINOPRIL 5 MG PO TABS: 5.0000 mg | ORAL_TABLET | Freq: Every day | ORAL | 3 refills | Status: DC

## 2016-05-16 NOTE — Telephone Encounter (Signed)
Pt contacted office for refill request on the following medications:  lisinopril (PRINIVIL,ZESTRIL) 5 MG tablet.  [facility].  CB#[phone]/MW

## 2016-07-29 ENCOUNTER — Other Ambulatory Visit: Payer: Self-pay | Admitting: Family Medicine

## 2016-07-29 DIAGNOSIS — K219 Gastro-esophageal reflux disease without esophagitis: Secondary | ICD-10-CM

## 2016-07-29 MED ORDER — ESOMEPRAZOLE MAGNESIUM 40 MG PO CPDR: 40.0000 mg | DELAYED_RELEASE_CAPSULE | Freq: Every day | ORAL | 3 refills | Status: DC

## 2016-07-29 NOTE — Telephone Encounter (Signed)
Pt contacted office for refill request on the following medications:  esomeprazole (NEXIUM) 40 MG capsule.  [facility]. CB#[phone]/MW

## 2016-07-29 NOTE — Telephone Encounter (Signed)
Last refill 08/25/2015. LOV 04/11/2016. Allene DillonEmily Drozdowski, CMA

## 2016-08-13 ENCOUNTER — Other Ambulatory Visit: Payer: Self-pay | Admitting: Family Medicine

## 2016-10-12 ENCOUNTER — Other Ambulatory Visit: Payer: Self-pay | Admitting: Family Medicine

## 2016-10-17 ENCOUNTER — Encounter: Payer: Self-pay | Admitting: Family Medicine

## 2016-10-17 ENCOUNTER — Ambulatory Visit (INDEPENDENT_AMBULATORY_CARE_PROVIDER_SITE_OTHER): Payer: Medicare Other | Admitting: Family Medicine

## 2016-10-17 VITALS — BP 130/70 | HR 82 | Temp 98.0°F | Resp 16

## 2016-10-17 DIAGNOSIS — E039 Hypothyroidism, unspecified: Secondary | ICD-10-CM | POA: Diagnosis not present

## 2016-10-17 DIAGNOSIS — I10 Essential (primary) hypertension: Secondary | ICD-10-CM

## 2016-10-17 DIAGNOSIS — M810 Age-related osteoporosis without current pathological fracture: Secondary | ICD-10-CM

## 2016-10-17 DIAGNOSIS — E1129 Type 2 diabetes mellitus with other diabetic kidney complication: Secondary | ICD-10-CM

## 2016-10-17 DIAGNOSIS — R053 Chronic cough: Secondary | ICD-10-CM

## 2016-10-17 DIAGNOSIS — E559 Vitamin D deficiency, unspecified: Secondary | ICD-10-CM | POA: Diagnosis not present

## 2016-10-17 DIAGNOSIS — J449 Chronic obstructive pulmonary disease, unspecified: Secondary | ICD-10-CM | POA: Diagnosis not present

## 2016-10-17 DIAGNOSIS — R05 Cough: Secondary | ICD-10-CM

## 2016-10-17 DIAGNOSIS — M542 Cervicalgia: Secondary | ICD-10-CM

## 2016-10-17 DIAGNOSIS — G546 Phantom limb syndrome with pain: Secondary | ICD-10-CM | POA: Insufficient documentation

## 2016-10-17 DIAGNOSIS — E785 Hyperlipidemia, unspecified: Secondary | ICD-10-CM

## 2016-10-17 DIAGNOSIS — R059 Cough, unspecified: Secondary | ICD-10-CM

## 2016-10-17 DIAGNOSIS — Z89511 Acquired absence of right leg below knee: Secondary | ICD-10-CM

## 2016-10-17 LAB — POCT GLYCOSYLATED HEMOGLOBIN (HGB A1C)
Est. average glucose Bld gHb Est-mCnc: 166
Hemoglobin A1C: 7.4

## 2016-10-17 MED ORDER — BENZONATATE 100 MG PO CAPS: 100.0000 mg | ORAL_CAPSULE | Freq: Three times a day (TID) | ORAL | 4 refills | Status: DC | PRN

## 2016-10-17 MED ORDER — PREDNISONE 10 MG PO TABS: ORAL_TABLET | ORAL | 0 refills | Status: AC

## 2016-10-17 MED ORDER — GABAPENTIN 100 MG PO CAPS: 100.0000 mg | ORAL_CAPSULE | Freq: Three times a day (TID) | ORAL | 3 refills | Status: DC

## 2016-10-17 NOTE — Progress Notes (Signed)
Patient: Kayla Boyle Female    DOB: [birth year]   82 y.o.   MRN: [id number] Visit Date: 10/17/2016  Today's Provider: Lelon Huh, MD   Chief Complaint  Patient presents with  . Follow-up  . Diabetes  . Hypertension  . COPD  . Gastroesophageal Reflux   Subjective:    HPI   Diabetes Mellitus Type II, Follow-up:   Lab Results  Component Value Date   HGBA1C 7.4 10/17/2016   HGBA1C 6.5 04/11/2016   HGBA1C 6.8 10/24/2015   Last seen for diabetes 6 months ago.  Management since then includes; no changes. She reports good compliance with treatment. She is not having side effects. none Current symptoms include none and have been unchanged. Home blood sugar records: fasting range: 138  Episodes of hypoglycemia? no   Current Insulin Regimen: n/a  Most Recent Eye Exam: 11/28/2015 Weight trend: stable Prior visit with dietician: no Current diet: well balanced Current exercise: none  ----------------------------------------------------------------    Hypertension, follow-up:  BP Readings from Last 3 Encounters:  10/17/16 130/70  04/11/16 120/74  01/18/16 118/64    She was last seen for hypertension 6 months ago.  BP at that visit was 120/74. Management since that visit includes; no changes.She reports good compliance with treatment. She is not having side effects. none She is not exercising. She is adherent to low salt diet.   Outside blood pressures are not checking. She is experiencing none.  Patient denies none.   Cardiovascular risk factors include diabetes mellitus.  Use of agents associated with hypertension: none.   ----------------------------------------------------------------  COPD, moderate ([place]) From 04/11/2016-no changes. Follow up Dr. Raul Del next month as scheduled. States her breathing has been pretty good lately. However she continues to have daily cough. She states tessalon helps, but prednisone work better and request prescription  for these.    Gastroesophageal reflux disease, esophagitis presence not specified From 04/11/2016-no changes..    Also compmlains she has had a kink in her neck for the last 4-5 days. No known injury. No radiation into arms.   She also states she has had burning phantom in her right lower leg for several weeks. No redness or swelling of stump. Mainly bothers her at night and keeps her awake.   Follow up hyperlipidemia Lab Results  Component Value Date   CHOL 171 01/02/2015   HDL 58 01/02/2015   LDLCALC 79 01/02/2015   TRIG 168 (H) 01/02/2015   CHOLHDL 2.9 01/02/2015   She reports she continues to take lovastatin consistently with no side effects.   Follow up hypomagnasemia She states she continues to take OTC magnesium every day. She requests note states that she is taking OTC vitamin D, magnesium, and B12 for HUD, apparently these medically necessary OTC supplements are part of HUD benefit.   Follow up hypothyroid Lab Results  Component Value Date   TSH 2.290 10/24/2015   Follow up vitamin d deficiency/osteoporosis No results found for: VD25OH  She states she is taking her OTC 400 units vitamin d every day.    Allergies  Allergen Reactions  . Bee Venom Hives  . Codeine Rash and Swelling  . Penicillins Rash and Swelling     Current Outpatient Prescriptions:  .  albuterol (PROVENTIL HFA;VENTOLIN HFA) 108 (90 BASE) MCG/ACT inhaler, Inhale 2 puffs into the lungs every 4 (four) hours as needed., Disp: , Rfl:  .  alendronate (FOSAMAX) 70 MG tablet, TAKE 1 TABLET EVERY WEEK 30 MINUTES BEFORE  FIRST FOOD AND BEVERAGE, Disp: 12 tablet, Rfl: 4 .  aspirin 81 MG tablet, Take 1 tablet by mouth daily., Disp: , Rfl:  .  Azelastine HCl (ASTEPRO) 0.15 % SOLN, 2 sprays into each nostril twice a day, Disp: 30 mL, Rfl: 1 .  Blood Glucose Monitoring Suppl (ACCU-CHEK COMPACT CARE KIT) KIT, As directed, Disp: 1 each, Rfl: 0 .  esomeprazole (NEXIUM) 40 MG capsule, Take 1 capsule (40 mg total)  by mouth daily. Take instead of pantoprazole for reflux, Disp: 90 capsule, Rfl: 3 .  fluticasone (FLONASE) 50 MCG/ACT nasal spray, Place 2 sprays into both nostrils daily., Disp: 16 g, Rfl: 4 .  furosemide (LASIX) 20 MG tablet, Take 1 tablet (20 mg total) by mouth daily as needed (swelling)., Disp: 30 tablet, Rfl: 6 .  glucose blood (ACCU-CHEK AVIVA PLUS) test strip, Use to check blood sugar once a day, Disp: 100 each, Rfl: 4 .  HYDROcodone-acetaminophen (NORCO/VICODIN) 5-325 MG tablet, Take 1 tablet by mouth every 8 (eight) hours as needed., Disp: 30 tablet, Rfl: 0 .  ipratropium (ATROVENT) 0.06 % nasal spray, Place 2 sprays into both nostrils 4 (four) times daily., Disp: 15 mL, Rfl: 3 .  levothyroxine (SYNTHROID, LEVOTHROID) 75 MCG tablet, TAKE ONE TABLET BY MOUTH EVERY DAY, Disp: 180 tablet, Rfl: 4 .  lisinopril (PRINIVIL,ZESTRIL) 5 MG tablet, Take 1 tablet (5 mg total) by mouth daily., Disp: 90 tablet, Rfl: 3 .  lovastatin (MEVACOR) 40 MG tablet, Take 1 tablet (40 mg total) by mouth at bedtime., Disp: 90 tablet, Rfl: 4 .  Magnesium Oxide 500 MG TABS, Take 1 tablet by mouth daily., Disp: , Rfl:  .  meclizine (ANTIVERT) 25 MG tablet, Take 1 tablet (25 mg total) by mouth 3 (three) times daily as needed for dizziness., Disp: 30 tablet, Rfl: 3 .  metFORMIN (GLUCOPHAGE-XR) 500 MG 24 hr tablet, Take 1 tablet (500 mg total) by mouth daily., Disp: 90 tablet, Rfl: 4 .  methocarbamol (ROBAXIN) 500 MG tablet, TAKE TWO TABLETS BY MOUTH EVERY 6 HOURS AS NEEDED FOR MUSCLE SPASMS, Disp: 60 tablet, Rfl: 12 .  mometasone-formoterol (DULERA) 200-5 MCG/ACT AERO, Inhale 2 puffs into the lungs 2 (two) times daily., Disp: 8.8 g, Rfl: 0 .  montelukast (SINGULAIR) 10 MG tablet, Take 1 tablet (10 mg total) by mouth daily., Disp: 90 tablet, Rfl: 4 .  traMADol (ULTRAM) 50 MG tablet, Take 1-2 tablets (50-100 mg total) by mouth every 8 (eight) hours as needed., Disp: 30 tablet, Rfl: 1 .  triamcinolone lotion (KENALOG) 0.1 %,  Apply 1 application topically 2 (two) times daily., Disp: , Rfl:  .  Vitamin D, Cholecalciferol, 400 UNITS CHEW, Chew 1 tablet by mouth daily., Disp: , Rfl:   Review of Systems  Constitutional: Negative for appetite change, chills, fatigue and fever.  Respiratory: Negative for chest tightness and shortness of breath.   Cardiovascular: Negative for chest pain and palpitations.  Gastrointestinal: Negative for abdominal pain, nausea and vomiting.  Neurological: Negative for dizziness and weakness.    Social History  Substance Use Topics  . Smoking status: Former Smoker    Types: Cigarettes    Quit date: 10/06/2001  . Smokeless tobacco: Never Used  . Alcohol use No   Objective:   BP 130/70 (BP Location: Left Arm, Patient Position: Sitting, Cuff Size: Large)   Pulse 82   Temp 98 F (36.7 C) (Oral)   Resp 16   SpO2 95%  Vitals:   10/17/16 0824  BP: 130/70  Pulse: 82  Resp: 16  Temp: 98 F (36.7 C)  TempSrc: Oral  SpO2: 95%     Physical Exam   General Appearance:    Alert, cooperative, no distress  Eyes:    PERRL, conjunctiva/corneas clear, EOM's intact       Lungs:     Clear to auscultation bilaterally, respirations unlabored  Heart:    Regular rate and rhythm  Neurologic:   Awake, alert, oriented x 3. No apparent focal neurological           defect.   MS:   s/p right BKA. No swelling or inflammation of stump.  Slight tenderness of c-spine. No swelling. Normal strength and s/s of UEs.     Results for orders placed or performed in visit on 10/17/16  POCT glycosylated hemoglobin (Hb A1C)  Result Value Ref Range   Hemoglobin A1C 7.4    Est. average glucose Bld gHb Est-mCnc 166        Assessment & Plan:     1. Type 2 diabetes mellitus with other diabetic kidney complication, without long-term current use of insulin (HCC) A1c up today. She is to get more strict with diet. Continue current medications.  Recheck a1c in 4 months.  - POCT glycosylated hemoglobin (Hb  A1C) - Comprehensive metabolic panel  2. Essential hypertension Stable. Continue current medications.   - Comprehensive metabolic panel  3. COPD, moderate ([place]) Stable. Continue current inhalers routine follow with pulmonology  4. Hypothyroidism, unspecified type  - T4 AND TSH  5. Osteoporosis without current pathological fracture, unspecified osteoporosis type Due for BMD Fall of 2018  6. Vitamin D deficiency On OTC daily vitamin d supplements.  - VITAMIN D 25 Hydroxy (Vit-D Deficiency, Fractures)  7. Hypomagnesemia ON OTC magnesium supplements. Note written that medically necessary to take b12, magnesium and vitamin D supplements.  - Magnesium  8. Hyperlipidemia, unspecified hyperlipidemia type She is tolerating lovastatin well with no adverse effects.   - Comprehensive metabolic panel - Lipid panel - CBC  9. Chronic cough Secondary to COPD  - benzonatate (TESSALON) 100 MG capsule; Take 1 capsule (100 mg total) by mouth 3 (three) times daily as needed for cough.  Dispense: 90 capsule; Refill: 4 - predniSONE (DELTASONE) 10 MG tablet; 6 tablets for 2 days, then 5 for 2 days, then 4 for 2 days, then 3 for 2 days, then 2 for 2 days, then 1 for 2 days.  Dispense: 42 tablet; Refill: 0  10. Status post below knee amputation of right lower extremity (HCC)  - gabapentin (NEURONTIN) 100 MG capsule; Take 1-2 capsules (100-200 mg total) by mouth 3 (three) times daily.  Dispense: 90 capsule; Refill: 3  11. Pain, phantom limb (HCC)  - gabapentin (NEURONTIN) 100 MG capsule; Take 1-2 capsules (100-200 mg total) by mouth 3 (three) times daily.  Dispense: 90 capsule; Refill: 3  12. Neck pain Expect improvement of neck pain with prednisone. Advised to call for prescription for muscle relaxer if not much better when finished with prednisone.   Addressed extensive list of chronic and acute medical problems today requiring extensive time in counseling and coordination care.  Over half of  this 45 minute visit were spent in counseling and coordinating care of multiple medical problems.     The entirety of the information documented in the History of Present Illness, Review of Systems and Physical Exam were personally obtained by me. Portions of this information were initially documented by Wilburt Finlay, Fin[place] and reviewed by  me for thoroughness and accuracy.    Lelon Huh, MD  [facility]

## 2016-10-18 LAB — CBC
Hematocrit: 35.5 % (ref 34.0–46.6)
Hemoglobin: 12 g/dL (ref 11.1–15.9)
MCH: 30.2 pg (ref 26.6–33.0)
MCHC: 33.8 g/dL (ref 31.5–35.7)
MCV: 89 fL (ref 79–97)
Platelets: 387 10*3/uL — ABNORMAL HIGH (ref 150–379)
RBC: 3.97 x10E6/uL (ref 3.77–5.28)
RDW: 12.7 % (ref 12.3–15.4)
WBC: 8.5 10*3/uL (ref 3.4–10.8)

## 2016-10-18 LAB — LIPID PANEL
Chol/HDL Ratio: 3.9 ratio (ref 0.0–4.4)
Cholesterol, Total: 185 mg/dL (ref 100–199)
HDL: 47 mg/dL (ref >39)
LDL Calculated: 100 mg/dL — ABNORMAL HIGH (ref 0–99)
Triglycerides: 189 mg/dL — ABNORMAL HIGH (ref 0–149)
VLDL Cholesterol Cal: 38 mg/dL (ref 5–40)

## 2016-10-18 LAB — COMPREHENSIVE METABOLIC PANEL
ALT: 17 IU/L (ref 0–32)
AST: 20 IU/L (ref 0–40)
Albumin/Globulin Ratio: 1.5 (ref 1.2–2.2)
Albumin: 4 g/dL (ref 3.5–4.7)
Alkaline Phosphatase: 82 IU/L (ref 39–117)
BUN/Creatinine Ratio: 16 (ref 12–28)
BUN: 17 mg/dL (ref 8–27)
Bilirubin Total: 0.2 mg/dL (ref 0.0–1.2)
CO2: 27 mmol/L (ref 18–29)
Calcium: 9.7 mg/dL (ref 8.7–10.3)
Chloride: 100 mmol/L (ref 96–106)
Creatinine, Ser: 1.08 mg/dL — ABNORMAL HIGH (ref 0.57–1.00)
GFR calc Af Amer: 56 mL/min/{1.73_m2} — ABNORMAL LOW (ref >59)
GFR calc non Af Amer: 49 mL/min/{1.73_m2} — ABNORMAL LOW (ref >59)
Globulin, Total: 2.7 g/dL (ref 1.5–4.5)
Glucose: 120 mg/dL — ABNORMAL HIGH (ref 65–99)
Potassium: 4.9 mmol/L (ref 3.5–5.2)
Sodium: 139 mmol/L (ref 134–144)
Total Protein: 6.7 g/dL (ref 6.0–8.5)

## 2016-10-18 LAB — VITAMIN D 25 HYDROXY (VIT D DEFICIENCY, FRACTURES): Vit D, 25-Hydroxy: 32.8 ng/mL (ref 30.0–100.0)

## 2016-10-18 LAB — T4 AND TSH
T4, Total: 8.1 ug/dL (ref 4.5–12.0)
TSH: 2.2 u[IU]/mL (ref 0.450–4.500)

## 2016-10-18 LAB — MAGNESIUM: Magnesium: 1.8 mg/dL (ref 1.6–2.3)

## 2016-10-18 NOTE — Progress Notes (Signed)
Advised  ED 

## 2016-10-21 ENCOUNTER — Other Ambulatory Visit: Payer: Self-pay | Admitting: Family Medicine

## 2016-10-21 MED ORDER — ALBUTEROL SULFATE HFA 108 (90 BASE) MCG/ACT IN AERS: 2.0000 | INHALATION_SPRAY | RESPIRATORY_TRACT | 11 refills | Status: DC | PRN

## 2016-10-21 NOTE — Telephone Encounter (Signed)
Pt contacted office for refill request on the following medications:  albuterol (PROVENTIL HFA;VENTOLIN HFA) 108 (90 BASE) MCG/ACT inhaler.  [employer]  CB#[phone]/MW

## 2016-11-29 DIAGNOSIS — E119 Type 2 diabetes mellitus without complications: Secondary | ICD-10-CM | POA: Diagnosis not present

## 2016-11-29 LAB — HM DIABETES EYE EXAM

## 2016-12-12 DIAGNOSIS — J986 Disorders of diaphragm: Secondary | ICD-10-CM | POA: Diagnosis not present

## 2016-12-12 DIAGNOSIS — J449 Chronic obstructive pulmonary disease, unspecified: Secondary | ICD-10-CM | POA: Diagnosis not present

## 2016-12-12 DIAGNOSIS — J310 Chronic rhinitis: Secondary | ICD-10-CM | POA: Diagnosis not present

## 2016-12-12 DIAGNOSIS — R05 Cough: Secondary | ICD-10-CM | POA: Diagnosis not present

## 2017-02-08 ENCOUNTER — Other Ambulatory Visit: Payer: Self-pay | Admitting: Family Medicine

## 2017-02-11 ENCOUNTER — Other Ambulatory Visit: Payer: Self-pay | Admitting: Family Medicine

## 2017-03-07 ENCOUNTER — Other Ambulatory Visit: Payer: Self-pay | Admitting: Family Medicine

## 2017-04-17 ENCOUNTER — Ambulatory Visit (INDEPENDENT_AMBULATORY_CARE_PROVIDER_SITE_OTHER): Payer: Medicare Other

## 2017-04-17 VITALS — BP 144/72 | HR 72 | Temp 98.7°F | Ht 69.0 in | Wt 161.2 lb

## 2017-04-17 DIAGNOSIS — M810 Age-related osteoporosis without current pathological fracture: Secondary | ICD-10-CM

## 2017-04-17 DIAGNOSIS — Z23 Encounter for immunization: Secondary | ICD-10-CM | POA: Diagnosis not present

## 2017-04-17 DIAGNOSIS — Z0000 Encounter for general adult medical examination without abnormal findings: Secondary | ICD-10-CM | POA: Diagnosis not present

## 2017-04-17 NOTE — Patient Instructions (Signed)
Ms. Kayla Boyle , Thank you for taking time to come for your Medicare Wellness Visit. I appreciate your ongoing commitment to your health goals. Please review the following plan we discussed and let me know if I can assist you in the future.   Screening recommendations/referrals: Colonoscopy: no longer required Mammogram: no longer required Bone Density: referral sent today Recommended yearly ophthalmology/optometry visit for glaucoma screening and checkup Recommended yearly dental visit for hygiene and checkup  Vaccinations: Influenza vaccine: completed Pneumococcal vaccine: completed series Tdap vaccine: Up to date Shingles vaccine: completed 2012    Advanced directives: Please bring a copy of your POA (Power of [place]) and/or Living Will to your next appointment.   Conditions/risks identified: Fall risk prevention; Recommend increasing water intake to 6 glasses of water a day.   Next appointment: 04/18/2017 @ 9:00 AM   Preventive Care 65 Years and Older, Female Preventive care refers to lifestyle choices and visits with your health care provider that can promote health and wellness. What does preventive care include?  A yearly physical exam. This is also called an annual well check.  Dental exams once or twice a year.  Routine eye exams. Ask your health care provider how often you should have your eyes checked.  Personal lifestyle choices, including:  Daily care of your teeth and gums.  Regular physical activity.  Eating a healthy diet.  Avoiding tobacco and drug use.  Limiting alcohol use.  Practicing safe sex.  Taking low-dose aspirin every day.  Taking vitamin and mineral supplements as recommended by your health care provider. What happens during an annual well check? The services and screenings done by your health care provider during your annual well check will depend on your age, overall health, lifestyle risk factors, and family history of  disease. Counseling  Your health care provider may ask you questions about your:  Alcohol use.  Tobacco use.  Drug use.  Emotional well-being.  Home and relationship well-being.  Sexual activity.  Eating habits.  History of falls.  Memory and ability to understand (cognition).  Work and work Astronomer.  Reproductive health. Screening  You may have the following tests or measurements:  Height, weight, and BMI.  Blood pressure.  Lipid and cholesterol levels. These may be checked every 5 years, or more frequently if you are over 97 years old.  Skin check.  Lung cancer screening. You may have this screening every year starting at age 71 if you have a 30-pack-year history of smoking and currently smoke or have quit within the past 15 years.  Fecal occult blood test (FOBT) of the stool. You may have this test every year starting at age 43.  Flexible sigmoidoscopy or colonoscopy. You may have a sigmoidoscopy every 5 years or a colonoscopy every 10 years starting at age 16.  Hepatitis C blood test.  Hepatitis B blood test.  Sexually transmitted disease (STD) testing.  Diabetes screening. This is done by checking your blood sugar (glucose) after you have not eaten for a while (fasting). You may have this done every 1-3 years.  Bone density scan. This is done to screen for osteoporosis. You may have this done starting at age 26.  Mammogram. This may be done every 1-2 years. Talk to your health care provider about how often you should have regular mammograms. Talk with your health care provider about your test results, treatment options, and if necessary, the need for more tests. Vaccines  Your health care provider may recommend certain vaccines, such  as:  Influenza vaccine. This is recommended every year.  Tetanus, diphtheria, and acellular pertussis (Tdap, Td) vaccine. You may need a Td booster every 10 years.  Zoster vaccine. You may need this after age  22.  Pneumococcal 13-valent conjugate (PCV13) vaccine. One dose is recommended after age 10.  Pneumococcal polysaccharide (PPSV23) vaccine. One dose is recommended after age 84. Talk to your health care provider about which screenings and vaccines you need and how often you need them. This information is not intended to replace advice given to you by your health care provider. Make sure you discuss any questions you have with your health care provider. Document Released: 07/21/2015 Document Revised: 03/13/2016 Document Reviewed: 04/25/2015 Elsevier Interactive Patient Education  2017 [employer] Prevention in the Home Falls can cause injuries. They can happen to people of all ages. There are many things you can do to make your home safe and to help prevent falls. What can I do on the outside of my home?  Regularly fix the edges of walkways and driveways and fix any cracks.  Remove anything that might make you trip as you walk through a door, such as a raised step or threshold.  Trim any bushes or trees on the path to your home.  Use bright outdoor lighting.  Clear any walking paths of anything that might make someone trip, such as rocks or tools.  Regularly check to see if handrails are loose or broken. Make sure that both sides of any steps have handrails.  Any raised decks and porches should have guardrails on the edges.  Have any leaves, snow, or ice cleared regularly.  Use sand or salt on walking paths during winter.  Clean up any spills in your garage right away. This includes oil or grease spills. What can I do in the bathroom?  Use night lights.  Install grab bars by the toilet and in the tub and shower. Do not use towel bars as grab bars.  Use non-skid mats or decals in the tub or shower.  If you need to sit down in the shower, use a plastic, non-slip stool.  Keep the floor dry. Clean up any water that spills on the floor as soon as it happens.  Remove  soap buildup in the tub or shower regularly.  Attach bath mats securely with double-sided non-slip rug tape.  Do not have throw rugs and other things on the floor that can make you trip. What can I do in the bedroom?  Use night lights.  Make sure that you have a light by your bed that is easy to reach.  Do not use any sheets or blankets that are too big for your bed. They should not hang down onto the floor.  Have a firm chair that has side arms. You can use this for support while you get dressed.  Do not have throw rugs and other things on the floor that can make you trip. What can I do in the kitchen?  Clean up any spills right away.  Avoid walking on wet floors.  Keep items that you use a lot in easy-to-reach places.  If you need to reach something above you, use a strong step stool that has a grab bar.  Keep electrical cords out of the way.  Do not use floor polish or wax that makes floors slippery. If you must use wax, use non-skid floor wax.  Do not have throw rugs and other things on the  floor that can make you trip. What can I do with my stairs?  Do not leave any items on the stairs.  Make sure that there are handrails on both sides of the stairs and use them. Fix handrails that are broken or loose. Make sure that handrails are as long as the stairways.  Check any carpeting to make sure that it is firmly attached to the stairs. Fix any carpet that is loose or worn.  Avoid having throw rugs at the top or bottom of the stairs. If you do have throw rugs, attach them to the floor with carpet tape.  Make sure that you have a light switch at the top of the stairs and the bottom of the stairs. If you do not have them, ask someone to add them for you. What else can I do to help prevent falls?  Wear shoes that:  Do not have high heels.  Have rubber bottoms.  Are comfortable and fit you well.  Are closed at the toe. Do not wear sandals.  If you use a  stepladder:  Make sure that it is fully opened. Do not climb a closed stepladder.  Make sure that both sides of the stepladder are locked into place.  Ask someone to hold it for you, if possible.  Clearly mark and make sure that you can see:  Any grab bars or handrails.  First and last steps.  Where the edge of each step is.  Use tools that help you move around (mobility aids) if they are needed. These include:  Canes.  Walkers.  Scooters.  Crutches.  Turn on the lights when you go into a dark area. Replace any light bulbs as soon as they burn out.  Set up your furniture so you have a clear path. Avoid moving your furniture around.  If any of your floors are uneven, fix them.  If there are any pets around you, be aware of where they are.  Review your medicines with your doctor. Some medicines can make you feel dizzy. This can increase your chance of falling. Ask your doctor what other things that you can do to help prevent falls. This information is not intended to replace advice given to you by your health care provider. Make sure you discuss any questions you have with your health care provider. Document Released: 04/20/2009 Document Revised: 11/30/2015 Document Reviewed: 07/29/2014 Elsevier Interactive Patient Education  2017 [employer].

## 2017-04-17 NOTE — Progress Notes (Signed)
Subjective:   Kayla Boyle is a 82 y.o. female who presents for Medicare Annual (Subsequent) preventive examination.  Review of Systems:  N/A  Cardiac Risk Factors include: advanced age (>57mn, >>47women);diabetes mellitus;dyslipidemia;hypertension     Objective:     Vitals: BP (!) 144/72 (BP Location: Left Arm)   Pulse 72   Temp 98.7 F (37.1 C) (Oral)   Ht 5' 9"  (1.753 m)   Wt 161 lb 3.2 oz (73.1 kg)   BMI 23.81 kg/m   Body mass index is 23.81 kg/m.   Tobacco History  Smoking Status  . Former Smoker  . Types: Cigarettes  . Quit date: 10/06/2001  Smokeless Tobacco  . Never Used     Counseling given: Not Answered   Past Medical History:  Diagnosis Date  . H/O acute poliomyelitis 11/24/2014  . History of chicken pox   . Shingles    Past Surgical History:  Procedure Laterality Date  . ABDOMINAL HYSTERECTOMY  1963  . APPENDECTOMY    . CARPAL TUNNEL RELEASE    . CHOLECYSTECTOMY  10/2003   laproscopic  . ELBOW SURGERY Bilateral   . HAND SURGERY  01/2010  . LEG AMPUTATION BELOW KNEE Right 1975   traumatic; bleow knee and club foot   Family History  Problem Relation Age of Onset  . Pancreatic cancer Father   . Prostate cancer Brother   . Diabetes Brother        type 2  . Hepatitis C Daughter   . HIV/AIDS Son   . Cancer Son        LYMPH NODE CANCER  . Cancer Other 57       small cell cancer   History  Sexual Activity  . Sexual activity: Not on file    Outpatient Encounter Prescriptions as of 04/17/2017  Medication Sig  . albuterol (PROVENTIL HFA;VENTOLIN HFA) 108 (90 Base) MCG/ACT inhaler Inhale 2 puffs into the lungs every 4 (four) hours as needed.  .Marland Kitchenalendronate (FOSAMAX) 70 MG tablet TAKE ONE TABLET BY MOUTH ONCE A WEEK. TAKE WITH A FULL GLASS OF WATERAND DO NOT LIE BACK DOWN FOR 30 MINUTES AFTER.  .Marland Kitchenaspirin 81 MG tablet Take 1 tablet by mouth daily.  . benzonatate (TESSALON) 100 MG capsule Take 1 capsule (100 mg total) by mouth 3 (three)  times daily as needed for cough.  . Blood Glucose Monitoring Suppl (ACCU-CHEK COMPACT CARE KIT) KIT As directed  . esomeprazole (NEXIUM) 40 MG capsule Take 1 capsule (40 mg total) by mouth daily. Take instead of pantoprazole for reflux  . furosemide (LASIX) 20 MG tablet Take 1 tablet (20 mg total) by mouth daily as needed (swelling).  . gabapentin (NEURONTIN) 100 MG capsule Take 1-2 capsules (100-200 mg total) by mouth 3 (three) times daily.  .Marland Kitchenglucose blood (ACCU-CHEK AVIVA PLUS) test strip Use to check blood sugar once a day  . levothyroxine (SYNTHROID, LEVOTHROID) 75 MCG tablet TAKE ONE TABLET BY MOUTH EVERY DAY  . lisinopril (PRINIVIL,ZESTRIL) 5 MG tablet Take 1 tablet (5 mg total) by mouth daily.  .Marland Kitchenlovastatin (MEVACOR) 40 MG tablet Take 1 tablet (40 mg total) by mouth at bedtime.  . Magnesium Oxide 500 MG TABS Take 1 tablet by mouth daily.  . meclizine (ANTIVERT) 25 MG tablet Take 1 tablet (25 mg total) by mouth 3 (three) times daily as needed for dizziness.  . metFORMIN (GLUCOPHAGE-XR) 500 MG 24 hr tablet TAKE ONE TABLET BY MOUTH EVERY DAY  .  methocarbamol (ROBAXIN) 500 MG tablet TAKE TWO TABLETS BY MOUTH EVERY 6 HOURS AS NEEDED FOR MUSCLE SPASMS  . montelukast (SINGULAIR) 10 MG tablet TAKE ONE TABLET BY MOUTH EVERY DAY  . traMADol (ULTRAM) 50 MG tablet Take 1-2 tablets (50-100 mg total) by mouth every 8 (eight) hours as needed.  . vitamin B-12 (CYANOCOBALAMIN) 100 MCG tablet Take 100 mcg by mouth daily.  . Vitamin D, Cholecalciferol, 400 UNITS CHEW Chew 1 tablet by mouth daily.  . [DISCONTINUED] triamcinolone lotion (KENALOG) 0.1 % Apply 1 application topically 2 (two) times daily.  Marland Kitchen HYDROcodone-acetaminophen (NORCO/VICODIN) 5-325 MG tablet Take 1 tablet by mouth every 8 (eight) hours as needed. (Patient not taking: Reported on 04/17/2017)  . mometasone-formoterol (DULERA) 200-5 MCG/ACT AERO Inhale 2 puffs into the lungs 2 (two) times daily. (Patient not taking: Reported on 04/17/2017)    . [DISCONTINUED] Azelastine HCl (ASTEPRO) 0.15 % SOLN 2 sprays into each nostril twice a day (Patient not taking: Reported on 04/17/2017)  . [DISCONTINUED] fluticasone (FLONASE) 50 MCG/ACT nasal spray Place 2 sprays into both nostrils daily.  . [DISCONTINUED] ipratropium (ATROVENT) 0.06 % nasal spray Place 2 sprays into both nostrils 4 (four) times daily.   No facility-administered encounter medications on file as of 04/17/2017.     Activities of Daily Living In your present state of health, do you have any difficulty performing the following activities: 04/17/2017  Hearing? N  Vision? Y  Comment cataracts- follows up with Dr Edison Pace  Difficulty concentrating or making decisions? N  Comment chronic back pain  Walking or climbing stairs? Y  Dressing or bathing? N  Doing errands, shopping? N  Preparing Food and eating ? N  Using the Toilet? N  In the past six months, have you accidently leaked urine? Y  Comment wears protection  Do you have problems with loss of bowel control? Y  Comment occasionally due to colitis  Managing your Medications? N  Managing your Finances? N  Housekeeping or managing your Housekeeping? Y  Comment has help with floors  Some recent data might be hidden    Patient Care Team: Birdie Sons, MD as PCP - General (Family Medicine) Erby Pian, MD as Referring Physician (Pulmonary Disease) Gaynelle Arabian, MD as Consulting Physician (Orthopedic Surgery) Eulogio Bear, MD as Consulting Physician (Ophthalmology)    Assessment:     Exercise Activities and Dietary recommendations Current Exercise Habits: The patient does not participate in regular exercise at present, Exercise limited by: orthopedic condition(s)  Goals    . Increase water intake          Recommend increasing water intake to 6 glasses of water a day.       Fall Risk Fall Risk  04/17/2017 01/18/2016 01/18/2016 01/02/2015  Falls in the past year? Yes No No No  Number falls in  past yr: 1 - - -  Injury with Fall? No - - -  Follow up Falls prevention discussed - - -   Depression Screen PHQ 2/9 Scores 04/17/2017 04/17/2017 01/18/2016 01/18/2016  PHQ - 2 Score 0 0 1 0  PHQ- 9 Score 5 - - -     Cognitive Function     6CIT Screen 04/17/2017  What Year? 0 points  What month? 0 points  What time? 0 points  Count back from 20 0 points  Months in reverse 0 points  Repeat phrase 0 points  Total Score 0    Immunization History  Administered Date(s) Administered  . Influenza,  High Dose Seasonal PF 03/23/2015, 04/11/2016, 04/17/2017  . Pneumococcal Conjugate-13 08/26/2013  . Pneumococcal Polysaccharide-23 06/13/2005  . Td 02/24/2008  . Tdap 03/26/2011  . Zoster 07/19/2010   Screening Tests Health Maintenance  Topic Date Due  . FOOT EXAM  07/12/1946  . DEXA SCAN  02/10/2017  . HEMOGLOBIN A1C  04/18/2017  . OPHTHALMOLOGY EXAM  11/29/2017  . TETANUS/TDAP  03/25/2021  . INFLUENZA VACCINE  Completed  . PNA vac Low Risk Adult  Completed      Plan:  I have personally reviewed and addressed the Medicare Annual Wellness questionnaire and have noted the following in the patient's chart:  A. Medical and social history B. Use of alcohol, tobacco or illicit drugs  C. Current medications and supplements D. Functional ability and status E.  Nutritional status F.  Physical activity G. Advance directives H. List of other physicians I.  Hospitalizations, surgeries, and ER visits in previous 12 months J.  [employer] such as hearing and vision if needed, cognitive and depression L. Referrals and appointments - none  In addition, I have reviewed and discussed with patient certain preventive protocols, quality metrics, and best practice recommendations. A written personalized care plan for preventive services as well as general preventive health recommendations were provided to patient.  See attached scanned questionnaire for additional information.    Signed,  Fabio Neighbors, LPN Nurse Health Advisor   MD Recommendations: Pt needs a diabetic foot exam.

## 2017-04-18 ENCOUNTER — Encounter: Payer: Medicare Other | Admitting: Family Medicine

## 2017-04-26 ENCOUNTER — Other Ambulatory Visit: Payer: Self-pay | Admitting: Family Medicine

## 2017-04-26 NOTE — Telephone Encounter (Signed)
Pharmacy requesting refills. Thanks!  

## 2017-05-19 ENCOUNTER — Other Ambulatory Visit: Payer: Self-pay | Admitting: Family Medicine

## 2017-05-19 DIAGNOSIS — K219 Gastro-esophageal reflux disease without esophagitis: Secondary | ICD-10-CM

## 2017-05-20 ENCOUNTER — Encounter: Payer: Self-pay | Admitting: Family Medicine

## 2017-05-20 ENCOUNTER — Ambulatory Visit (INDEPENDENT_AMBULATORY_CARE_PROVIDER_SITE_OTHER): Payer: Medicare Other | Admitting: Family Medicine

## 2017-05-20 VITALS — BP 116/62 | HR 76 | Temp 97.6°F | Resp 16 | Ht 69.0 in | Wt 160.0 lb

## 2017-05-20 DIAGNOSIS — I10 Essential (primary) hypertension: Secondary | ICD-10-CM | POA: Diagnosis not present

## 2017-05-20 DIAGNOSIS — J449 Chronic obstructive pulmonary disease, unspecified: Secondary | ICD-10-CM

## 2017-05-20 DIAGNOSIS — K219 Gastro-esophageal reflux disease without esophagitis: Secondary | ICD-10-CM | POA: Diagnosis not present

## 2017-05-20 DIAGNOSIS — E785 Hyperlipidemia, unspecified: Secondary | ICD-10-CM | POA: Diagnosis not present

## 2017-05-20 DIAGNOSIS — E1129 Type 2 diabetes mellitus with other diabetic kidney complication: Secondary | ICD-10-CM

## 2017-05-20 DIAGNOSIS — E039 Hypothyroidism, unspecified: Secondary | ICD-10-CM

## 2017-05-20 DIAGNOSIS — Z0000 Encounter for general adult medical examination without abnormal findings: Secondary | ICD-10-CM

## 2017-05-20 DIAGNOSIS — Z89511 Acquired absence of right leg below knee: Secondary | ICD-10-CM

## 2017-05-20 LAB — POCT GLYCOSYLATED HEMOGLOBIN (HGB A1C)
Est. average glucose Bld gHb Est-mCnc: 140
Hemoglobin A1C: 6.5

## 2017-05-20 MED ORDER — TRAMADOL HCL 50 MG PO TABS: 50.0000 mg | ORAL_TABLET | Freq: Three times a day (TID) | ORAL | 2 refills | Status: DC | PRN

## 2017-05-20 MED ORDER — ESOMEPRAZOLE MAGNESIUM 40 MG PO CPDR: 40.0000 mg | DELAYED_RELEASE_CAPSULE | Freq: Every day | ORAL | 3 refills | Status: DC

## 2017-05-20 MED ORDER — FUROSEMIDE 20 MG PO TABS: 20.0000 mg | ORAL_TABLET | Freq: Every day | ORAL | 6 refills | Status: DC | PRN

## 2017-05-20 NOTE — Patient Instructions (Signed)
   The CDC recommends two doses of Shingrix (the shingles vaccine) separated by 2 to 6 months for adults age 80 years and older. I recommend checking with your insurance plan regarding coverage for this vaccine.   

## 2017-05-20 NOTE — Progress Notes (Signed)
Patient: Kayla Boyle, Female    DOB: May 03, 1935, 82 y.o.   MRN: [id number] Visit Date: 05/20/2017  Today's Provider: Lelon Huh, MD   Chief Complaint  Patient presents with  . Annual Exam  . Hyperlipidemia  . Hypertension  . COPD  . Diabetes   Subjective:    Annual physical exam Kayla Boyle is a 82 y.o. female who presents today for health maintenance and complete physical. She feels well. She reports exercising none. She reports she is sleeping fairly well.  Cologuard- 03/13/2016. Negative BMD- 02/10/2014.  Mammogram- 09/03/2012.    Diabetes Mellitus Type II, Follow-up:   Lab Results  Component Value Date   HGBA1C 7.4 10/17/2016   HGBA1C 6.5 04/11/2016   HGBA1C 6.8 10/24/2015   Last seen for diabetes 7 months ago.  Management since then includes; no changes. She reports good compliance with treatment. She is not having side effects.  Current symptoms include none and have been stable. Home blood sugar records: stable.  Episodes of hypoglycemia? no   Current Insulin Regimen: none Most Recent Eye Exam: up to date Weight trend: stable Prior visit with dietician: no Current diet: well balanced Current exercise: none    Hypertension, follow-up:  BP Readings from Last 3 Encounters:  05/20/17 116/62  04/17/17 (!) 144/72  10/17/16 130/70    She was last seen for hypertension 7 months ago.  BP at that visit was 130/70. Management since that visit includes; no changes.She reports good compliance with treatment. She is not having side effects.  She is not exercising. She is adherent to low salt diet.   Outside blood pressures are checked occasionally. She is experiencing none.  Patient denies exertional chest pressure/discomfort, lower extremity edema and palpitations.   Cardiovascular risk factors include diabetes mellitus and dyslipidemia.    Lipid/Cholesterol, Follow-up:   Last seen for this 7 months ago.  Management since that visit  includes; labs checked, no changes.  Last Lipid Panel:    Component Value Date/Time   CHOL 185 10/17/2016 0918   TRIG 189 (H) 10/17/2016 0918   HDL 47 10/17/2016 0918   CHOLHDL 3.9 10/17/2016 0918   LDLCALC 100 (H) 10/17/2016 9643    She reports good compliance with treatment. She is not having side effects.   Wt Readings from Last 3 Encounters:  05/20/17 160 lb (72.6 kg)  04/17/17 161 lb 3.2 oz (73.1 kg)  01/18/16 159 lb (72.1 kg)    COPD, moderate (HCC) From 10/17/2016-Continue current inhalers routine follow with pulmonology.  Hypothyroidism, unspecified type From 10/17/2016-labs checked, no changes.   Lab Results  Component Value Date   TSH 2.200 10/17/2016     Vitamin D deficiency From 10/17/2016-labs checked, no changes.  Lab Results  Component Value Date   VD25OH 32.8 10/17/2016    Review of Systems  Constitutional: Negative.   HENT: Negative.   Eyes: Negative.   Respiratory: Negative.   Cardiovascular: Negative.   Gastrointestinal: Negative.   Endocrine: Negative.   Genitourinary: Negative.   Musculoskeletal: Negative.   Skin: Negative.   Allergic/Immunologic: Negative.   Neurological: Negative.   Hematological: Negative.   Psychiatric/Behavioral: Negative.     Social History      She  reports that she quit smoking about 15 years ago. Her smoking use included cigarettes. she has never used smokeless tobacco. She reports that she does not drink alcohol or use drugs.       Social History   Socioeconomic History  .  Marital status: Single    Spouse name: None  . Number of children: None  . Years of education: 41  . Highest education level: None  Social Needs  . Financial resource strain: None  . Food insecurity - worry: None  . Food insecurity - inability: None  . Transportation needs - medical: None  . Transportation needs - non-medical: None  Occupational History  . Occupation: Disabled  . Occupation: retired  Tobacco Use  . Smoking  status: Former Smoker    Types: Cigarettes    Last attempt to quit: 10/06/2001    Years since quitting: 15.6  . Smokeless tobacco: Never Used  Substance and Sexual Activity  . Alcohol use: No    Alcohol/week: 0.0 oz  . Drug use: No  . Sexual activity: None  Other Topics Concern  . None  Social History Narrative  . None    Past Medical History:  Diagnosis Date  . H/O acute poliomyelitis 11/24/2014  . History of chicken pox   . Shingles      Patient Active Problem List   Diagnosis Date Noted  . Pain, phantom limb ([place]) 10/17/2016  . Low back pain 10/24/2015  . Chronic cough 08/24/2015  . Allergic rhinitis 02/09/2015  . Arthritis of shoulder region, right 01/02/2015  . Arthritis 11/24/2014  . Chronic kidney disease (CKD), stage III (moderate) ([place]) 11/24/2014  . Club foot 11/24/2014  . Diabetes mellitus with renal complications ([place]) 95/03/3266  . Eczema of hand 11/24/2014  . GERD (gastroesophageal reflux disease) 11/24/2014  . Arthralgia of hip 11/24/2014  . Hyperlipidemia 11/24/2014  . Hypertension 11/24/2014  . Hypomagnesemia 11/24/2014  . Hypothyroid 11/24/2014  . IBS (irritable bowel syndrome) 11/24/2014  . Mobility impaired 11/24/2014  . Restless leg 11/24/2014  . Restrictive lung disease 11/24/2014  . Status post below knee amputation ([place]) 11/24/2014  . Scoliosis 11/24/2014  . Vitamin D deficiency 11/24/2014  . COPD, moderate ([place]) 12/16/2013  . OP (osteoporosis) 09/02/2011    Past Surgical History:  Procedure Laterality Date  . ABDOMINAL HYSTERECTOMY  1963  . APPENDECTOMY    . CARPAL TUNNEL RELEASE    . CHOLECYSTECTOMY  10/2003   laproscopic  . ELBOW SURGERY Bilateral   . HAND SURGERY  01/2010  . LEG AMPUTATION BELOW KNEE Right 1975   traumatic; bleow knee and club foot    Family History        Family Status  Relation Name Status  . Father  Deceased       Cause of death: Pancreatic cancer  . Brother  Deceased       cause of death: prostate  cancer  . Daughter  Deceased  . Son  Deceased       died from lymph node cancer and AIDS  . Other Nephew Other  . Mother  Deceased       Died from Ola        Her family history includes Cancer in her son; Cancer (age of onset: 47) in her other; Diabetes in her brother; HIV/AIDS in her son; Hepatitis C in her daughter; Pancreatic cancer in her father; Prostate cancer in her brother.     Allergies  Allergen Reactions  . Bee Venom Hives  . Codeine Rash and Swelling  . Penicillins Rash and Swelling     Current Outpatient Medications:  .  albuterol (PROVENTIL HFA;VENTOLIN HFA) 108 (90 Base) MCG/ACT inhaler, Inhale 2 puffs into the lungs every 4 (four) hours as needed., Disp: 18  g, Rfl: 11 .  alendronate (FOSAMAX) 70 MG tablet, TAKE ONE TABLET BY MOUTH ONCE A WEEK. TAKE WITH A FULL GLASS OF WATERAND DO NOT LIE BACK DOWN FOR 30 MINUTES AFTER., Disp: 12 tablet, Rfl: 4 .  aspirin 81 MG tablet, Take 1 tablet by mouth daily., Disp: , Rfl:  .  benzonatate (TESSALON) 100 MG capsule, Take 1 capsule (100 mg total) by mouth 3 (three) times daily as needed for cough., Disp: 90 capsule, Rfl: 4 .  Blood Glucose Monitoring Suppl (ACCU-CHEK COMPACT CARE KIT) KIT, As directed, Disp: 1 each, Rfl: 0 .  esomeprazole (NEXIUM) 40 MG capsule, TAKE ONE CAPSULE BY MOUTH EVERY DAY -TAKE INSTEAD OF PANTOPRAZOLE FORREFLUX, Disp: 90 capsule, Rfl: 3 .  furosemide (LASIX) 20 MG tablet, Take 1 tablet (20 mg total) by mouth daily as needed (swelling)., Disp: 30 tablet, Rfl: 6 .  gabapentin (NEURONTIN) 100 MG capsule, Take 1-2 capsules (100-200 mg total) by mouth 3 (three) times daily., Disp: 90 capsule, Rfl: 3 .  glucose blood (ACCU-CHEK AVIVA PLUS) test strip, Use to check blood sugar once a day, Disp: 100 each, Rfl: 4 .  levothyroxine (SYNTHROID, LEVOTHROID) 75 MCG tablet, TAKE ONE TABLET BY MOUTH EVERY DAY, Disp: 180 tablet, Rfl: 4 .  lisinopril (PRINIVIL,ZESTRIL) 5 MG tablet, Take 1 tablet (5 mg total) by  mouth daily., Disp: 90 tablet, Rfl: 3 .  lovastatin (MEVACOR) 40 MG tablet, TAKE ONE TABLET BY MOUTH AT BEDTIME, Disp: 90 tablet, Rfl: 4 .  Magnesium Oxide 500 MG TABS, Take 1 tablet by mouth daily., Disp: , Rfl:  .  meclizine (ANTIVERT) 25 MG tablet, Take 1 tablet (25 mg total) by mouth 3 (three) times daily as needed for dizziness., Disp: 30 tablet, Rfl: 3 .  metFORMIN (GLUCOPHAGE-XR) 500 MG 24 hr tablet, TAKE ONE TABLET BY MOUTH EVERY DAY, Disp: 90 tablet, Rfl: 4 .  methocarbamol (ROBAXIN) 500 MG tablet, TAKE TWO TABLETS BY MOUTH EVERY 6 HOURS AS NEEDED FOR MUSCLE SPASMS, Disp: 60 tablet, Rfl: 12 .  montelukast (SINGULAIR) 10 MG tablet, TAKE ONE TABLET BY MOUTH EVERY DAY, Disp: 90 tablet, Rfl: 4 .  traMADol (ULTRAM) 50 MG tablet, Take 1-2 tablets (50-100 mg total) by mouth every 8 (eight) hours as needed., Disp: 30 tablet, Rfl: 1 .  vitamin B-12 (CYANOCOBALAMIN) 100 MCG tablet, Take 100 mcg by mouth daily., Disp: , Rfl:  .  Vitamin D, Cholecalciferol, 400 UNITS CHEW, Chew 1 tablet by mouth daily., Disp: , Rfl:    Patient Care Team: Birdie Sons, MD as PCP - General (Family Medicine) Erby Pian, MD as Referring Physician (Pulmonary Disease) Gaynelle Arabian, MD as Consulting Physician (Orthopedic Surgery) Eulogio Bear, MD as Consulting Physician (Ophthalmology)      Objective:   Vitals: BP 116/62 (BP Location: Left Arm, Patient Position: Sitting, Cuff Size: Normal)   Pulse 76   Temp 97.6 F (36.4 C)   Resp 16   Ht 5' 9" (1.753 m)   Wt 160 lb (72.6 kg) Comment: per patient  SpO2 98%   BMI 23.63 kg/m    Vitals:   05/20/17 0902  BP: 116/62  Pulse: 76  Resp: 16  Temp: 97.6 F (36.4 C)  SpO2: 98%  Weight: 160 lb (72.6 kg)  Height: 5' 9" (1.753 m)     Physical Exam   General Appearance:    Alert, cooperative, no distress, appears stated age  Head:    Normocephalic, without obvious abnormality, atraumatic  Eyes:  PERRL, conjunctiva/corneas clear, EOM's  intact, fundi    benign, both eyes  Ears:    Normal TM's and external ear canals, both ears  Nose:   Nares normal, septum midline, mucosa normal, no drainage    or sinus tenderness  Throat:   Lips, mucosa, and tongue normal; teeth and gums normal  Neck:   Supple, symmetrical, trachea midline, no adenopathy;    thyroid:  no enlargement/tenderness/nodules; no carotid   bruit or JVD  Back:     Symmetric, no curvature, ROM normal, no CVA tenderness  Lungs:     Clear to auscultation bilaterally, respirations unlabored  Chest Wall:    No tenderness or deformity   Heart:    Regular rate and rhythm, S1 and S2 normal, no murmur, rub   or gallop  Breast Exam:    patient declined  Abdomen:     Soft, non-tender, bowel sounds active all four quadrants,    no masses, no organomegaly  Pelvic:    deferred  Extremities:   Extremities normal, atraumatic, no cyanosis or edema. S/p right BKA.   Pulses:   2+ and symmetric all extremities  Skin:   Skin color, texture, turgor normal, no rashes or lesions  Lymph nodes:   Cervical, supraclavicular, and axillary nodes normal  Neurologic:   CNII-XII intact, normal strength, sensation and reflexes    throughout    Depression Screen PHQ 2/9 Scores 04/17/2017 04/17/2017 01/18/2016 01/18/2016  PHQ - 2 Score 0 0 1 0  PHQ- 9 Score 5 - - -   Results for orders placed or performed in visit on 05/20/17  POCT glycosylated hemoglobin (Hb A1C)  Result Value Ref Range   Hemoglobin A1C 6.5    Est. average glucose Bld gHb Est-mCnc 140       Assessment & Plan:     Routine Health Maintenance and Physical Exam  Exercise Activities and Dietary recommendations Goals    None      Immunization History  Administered Date(s) Administered  . Influenza, High Dose Seasonal PF 03/23/2015, 04/11/2016, 04/17/2017  . Pneumococcal Conjugate-13 08/26/2013  . Pneumococcal Polysaccharide-23 06/13/2005  . Td 02/24/2008  . Tdap 03/26/2011  . Zoster 07/19/2010    Health  Maintenance  Topic Date Due  . FOOT EXAM  07/12/1946  . DEXA SCAN  02/10/2017  . HEMOGLOBIN A1C  04/18/2017  . OPHTHALMOLOGY EXAM  11/29/2017  . TETANUS/TDAP  03/25/2021  . INFLUENZA VACCINE  Completed  . PNA vac Low Risk Adult  Completed     Discussed health benefits of physical activity, and encouraged her to engage in regular exercise appropriate for her age and condition.     1. Annual physical exam Generally doing well. She does not want to resume screening mammograms.   2. Essential hypertension Well controlled.  Continue current medications.    3. COPD, moderate (HCC) Continue current inhalers.   4. Type 2 diabetes mellitus with other diabetic kidney complication, without long-term current use of insulin (HCC) Well controlled.  Continue current medications.   - POCT glycosylated hemoglobin (Hb A1C)  5. Hypothyroidism, unspecified type Stable check labs in April.   6. Hyperlipidemia, unspecified hyperlipidemia type She is tolerating lovastatin well with no adverse effects.    7. Gastroesophageal reflux disease, esophagitis presence not specified Well controlled on esomeprazole (NEXIUM) 40 MG capsule; Take 1 capsule (40 mg total) daily by mouth.  Dispense: 90 capsule; Refill: 3  8. Status post below knee amputation of right lower extremity ([place]) Doing  well with motorized wheelchair and able to perform ADLs   Counseled regarding shingrix.    Lelon Huh, MD  [facility]

## 2017-05-27 ENCOUNTER — Ambulatory Visit
Admission: RE | Admit: 2017-05-27 | Discharge: 2017-05-27 | Disposition: A | Discharge status: Home / Self Care | Payer: Medicare Other | Source: Ambulatory Visit | Attending: Family Medicine | Admitting: Family Medicine

## 2017-05-27 ENCOUNTER — Other Ambulatory Visit: Payer: Medicare Other

## 2017-05-27 DIAGNOSIS — M810 Age-related osteoporosis without current pathological fracture: Secondary | ICD-10-CM | POA: Diagnosis not present

## 2017-05-27 DIAGNOSIS — M818 Other osteoporosis without current pathological fracture: Secondary | ICD-10-CM | POA: Diagnosis not present

## 2017-05-28 ENCOUNTER — Encounter: Payer: Self-pay | Admitting: Family Medicine

## 2017-05-28 DIAGNOSIS — M7550 Bursitis of unspecified shoulder: Secondary | ICD-10-CM | POA: Insufficient documentation

## 2017-05-28 DIAGNOSIS — M6530 Trigger finger, unspecified finger: Secondary | ICD-10-CM | POA: Insufficient documentation

## 2017-06-05 ENCOUNTER — Telehealth: Payer: Self-pay | Admitting: Family Medicine

## 2017-06-05 MED ORDER — NAPROXEN 375 MG PO TABS: 375.0000 mg | ORAL_TABLET | Freq: Two times a day (BID) | ORAL | 0 refills | Status: DC

## 2017-06-05 NOTE — Telephone Encounter (Signed)
Pt states she is have lower back pain and right leg pain.  Pt is requesting a Rx for pain.  [facility].  CB#[phone]/MW

## 2017-06-05 NOTE — Telephone Encounter (Signed)
Please advise 

## 2017-06-05 NOTE — Telephone Encounter (Signed)
Patient states that since the BMD she has had trouble with increased back pain and leg pain similar to sciatica pain.  She states that the Tramadol is not helping it and would like to have something else.  She would like it sent to the pharmacy today and have them diliver it.

## 2017-07-23 ENCOUNTER — Other Ambulatory Visit: Payer: Self-pay | Admitting: Family Medicine

## 2017-07-23 DIAGNOSIS — I10 Essential (primary) hypertension: Secondary | ICD-10-CM

## 2017-09-04 DIAGNOSIS — R05 Cough: Secondary | ICD-10-CM | POA: Diagnosis not present

## 2017-09-04 DIAGNOSIS — J310 Chronic rhinitis: Secondary | ICD-10-CM | POA: Diagnosis not present

## 2017-09-04 DIAGNOSIS — J449 Chronic obstructive pulmonary disease, unspecified: Secondary | ICD-10-CM | POA: Diagnosis not present

## 2017-09-11 DIAGNOSIS — M65341 Trigger finger, right ring finger: Secondary | ICD-10-CM | POA: Diagnosis not present

## 2017-09-11 DIAGNOSIS — M65331 Trigger finger, right middle finger: Secondary | ICD-10-CM | POA: Diagnosis not present

## 2017-10-06 ENCOUNTER — Other Ambulatory Visit: Payer: Self-pay | Admitting: Family Medicine

## 2017-10-06 MED ORDER — VENTOLIN HFA 108 (90 BASE) MCG/ACT IN AERS
2.0000 | INHALATION_SPRAY | Freq: Four times a day (QID) | RESPIRATORY_TRACT | 3 refills | Status: DC | PRN
Start: 2017-10-06 — End: 2017-10-21

## 2017-10-20 ENCOUNTER — Other Ambulatory Visit: Payer: Self-pay | Admitting: Family Medicine

## 2017-10-21 ENCOUNTER — Encounter: Payer: Self-pay | Admitting: Family Medicine

## 2017-10-21 ENCOUNTER — Ambulatory Visit (INDEPENDENT_AMBULATORY_CARE_PROVIDER_SITE_OTHER): Payer: Medicare HMO | Admitting: Family Medicine

## 2017-10-21 VITALS — BP 118/70 | HR 78 | Temp 97.6°F | Resp 16

## 2017-10-21 DIAGNOSIS — E559 Vitamin D deficiency, unspecified: Secondary | ICD-10-CM | POA: Diagnosis not present

## 2017-10-21 DIAGNOSIS — E039 Hypothyroidism, unspecified: Secondary | ICD-10-CM

## 2017-10-21 DIAGNOSIS — N183 Chronic kidney disease, stage 3 unspecified: Secondary | ICD-10-CM

## 2017-10-21 DIAGNOSIS — E1129 Type 2 diabetes mellitus with other diabetic kidney complication: Secondary | ICD-10-CM | POA: Diagnosis not present

## 2017-10-21 DIAGNOSIS — J449 Chronic obstructive pulmonary disease, unspecified: Secondary | ICD-10-CM | POA: Diagnosis not present

## 2017-10-21 DIAGNOSIS — E785 Hyperlipidemia, unspecified: Secondary | ICD-10-CM

## 2017-10-21 DIAGNOSIS — I10 Essential (primary) hypertension: Secondary | ICD-10-CM

## 2017-10-21 DIAGNOSIS — L299 Pruritus, unspecified: Secondary | ICD-10-CM | POA: Diagnosis not present

## 2017-10-21 DIAGNOSIS — K219 Gastro-esophageal reflux disease without esophagitis: Secondary | ICD-10-CM | POA: Diagnosis not present

## 2017-10-21 MED ORDER — ALBUTEROL SULFATE HFA 108 (90 BASE) MCG/ACT IN AERS: 2.0000 | INHALATION_SPRAY | Freq: Four times a day (QID) | RESPIRATORY_TRACT | 5 refills | Status: DC | PRN

## 2017-10-21 NOTE — Progress Notes (Signed)
Patient: Kayla Boyle Female    DOB: Dec 30, 1934   83 y.o.   MRN: [id number] Visit Date: 10/21/2017  Today's Provider: Lelon Huh, MD   Chief Complaint  Patient presents with  . Follow-up  . Diabetes  . Hypertension  . Hyperlipidemia  . COPD  . Hypothyroidism  . Gastroesophageal Reflux   Subjective:    HPI   Diabetes Mellitus Type II, Follow-up:   Lab Results  Component Value Date   HGBA1C 6.5 05/20/2017   HGBA1C 7.4 10/17/2016   HGBA1C 6.5 04/11/2016   Last seen for diabetes 5 months ago.  Management since then includes; no changes. She reports good compliance with treatment. She is not having side effects. none Current symptoms include none and have been unchanged. Home blood sugar records: fasting range: 126  Episodes of hypoglycemia? no   Current Insulin Regimen: n/a Most Recent Eye Exam: 11/29/2016 Weight trend: stable Prior visit with dietician: no Current diet: well balanced Current exercise: none  ----------------------------------------------------------------   Hypertension, follow-up:  BP Readings from Last 3 Encounters:  10/21/17 118/70  05/20/17 116/62  04/17/17 (!) 144/72    She was last seen for hypertension 5 months ago.  BP at that visit was 116/62. Management since that visit includes; no changes.She reports good compliance with treatment. She is not having side effects. none She is not exercising. She is adherent to low salt diet.   Outside blood pressures are not checking. She is experiencing none.  Patient denies none.   Cardiovascular risk factors include diabetes mellitus.  Use of agents associated with hypertension: none.   ----------------------------------------------------------------    Lipid/Cholesterol, Follow-up:   Last seen for this 5 months ago.  Management since that visit includes; no changes.  Last Lipid Panel:    Component Value Date/Time   CHOL 185 10/17/2016 0918   TRIG 189 (H) 10/17/2016  0918   HDL 47 10/17/2016 0918   CHOLHDL 3.9 10/17/2016 0918   LDLCALC 100 (H) 10/17/2016 6269    She reports good compliance with treatment. She is not having side effects. none  Wt Readings from Last 3 Encounters:  05/20/17 160 lb (72.6 kg)  04/17/17 161 lb 3.2 oz (73.1 kg)  01/18/16 159 lb (72.1 kg)    ----------------------------------------------------------------  COPD, moderate (HCC) From 05/20/2017-stable, no changes. She states she uses albuterol occasionally and is effective, but needs to change to [employer] due to insurance.   Hypothyroidism, unspecified type From 05/20/2017-Stable, no changes. Check labs in April.  Lab Results  Component Value Date   TSH 2.200 10/17/2016     Gastroesophageal reflux disease, esophagitis presence not specified From 05/20/2017-no changes were made. She states she thinks reflux is getting worse due to having frequent acid taste in mouth and belching all the time  She also states she has been having frequent episodes of diarrhea. She did stop taking aspirin, magnesium, b12 and vitamin d because she thought they may be aggravating these symptoms.   She also reports that she has persistent itching underbreasts and round back for several months,  sometimes even her head and neck. Tried several OTC creams including cortisone which didn't help at all. Has also been having a lot of trouble with eyes itching and wants to try something to help with allergies, does not want to use any eye drops.    Allergies  Allergen Reactions  . Bee Venom Hives  . Codeine Rash and Swelling  . Penicillins Rash and Swelling  Current Outpatient Medications:  .  alendronate (FOSAMAX) 70 MG tablet, TAKE ONE TABLET BY MOUTH ONCE A WEEK. TAKE WITH A FULL GLASS OF WATERAND DO NOT LIE BACK DOWN FOR 30 MINUTES AFTER., Disp: 12 tablet, Rfl: 4 .  benzonatate (TESSALON) 100 MG capsule, Take 1 capsule (100 mg total) by mouth 3 (three) times daily as needed for  cough., Disp: 90 capsule, Rfl: 4 .  Blood Glucose Monitoring Suppl (ACCU-CHEK COMPACT CARE KIT) KIT, As directed, Disp: 1 each, Rfl: 0 .  esomeprazole (NEXIUM) 40 MG capsule, Take 1 capsule (40 mg total) daily by mouth., Disp: 90 capsule, Rfl: 3 .  furosemide (LASIX) 20 MG tablet, Take 1 tablet (20 mg total) daily as needed by mouth (swelling)., Disp: 30 tablet, Rfl: 6 .  gabapentin (NEURONTIN) 100 MG capsule, Take 1-2 capsules (100-200 mg total) by mouth 3 (three) times daily., Disp: 90 capsule, Rfl: 3 .  glucose blood (ACCU-CHEK AVIVA PLUS) test strip, Use to check blood sugar once a day, Disp: 100 each, Rfl: 4 .  levothyroxine (SYNTHROID, LEVOTHROID) 75 MCG tablet, TAKE ONE TABLET BY MOUTH EVERY DAY, Disp: 180 tablet, Rfl: 4 .  lisinopril (PRINIVIL,ZESTRIL) 5 MG tablet, TAKE 1 TABLET BY MOUTH DAILY., Disp: 90 tablet, Rfl: 4 .  lovastatin (MEVACOR) 40 MG tablet, TAKE ONE TABLET BY MOUTH AT BEDTIME, Disp: 90 tablet, Rfl: 4 .  meclizine (ANTIVERT) 25 MG tablet, Take 1 tablet (25 mg total) by mouth 3 (three) times daily as needed for dizziness., Disp: 30 tablet, Rfl: 3 .  metFORMIN (GLUCOPHAGE-XR) 500 MG 24 hr tablet, TAKE ONE TABLET BY MOUTH EVERY DAY, Disp: 90 tablet, Rfl: 4 .  methocarbamol (ROBAXIN) 500 MG tablet, TAKE TWO TABLETS BY MOUTH EVERY 6 HOURS AS NEEDED FOR MUSCLE SPASMS, Disp: 60 tablet, Rfl: 12 .  montelukast (SINGULAIR) 10 MG tablet, TAKE ONE TABLET BY MOUTH EVERY DAY, Disp: 90 tablet, Rfl: 4 .  naproxen (NAPROSYN) 375 MG tablet, Take 1 tablet (375 mg total) by mouth 2 (two) times daily with a meal., Disp: 30 tablet, Rfl: 0 .  traMADol (ULTRAM) 50 MG tablet, Take 1-2 tablets (50-100 mg total) every 8 (eight) hours as needed by mouth., Disp: 30 tablet, Rfl: 2 .  VENTOLIN HFA 108 (90 Base) MCG/ACT inhaler, Inhale 2 puffs into the lungs every 6 (six) hours as needed for wheezing or shortness of breath., Disp: 54 Inhaler, Rfl: 3 .  aspirin 81 MG tablet, Take 1 tablet by mouth daily.,  Disp: , Rfl:  .  Magnesium Oxide 500 MG TABS, Take 1 tablet by mouth daily., Disp: , Rfl:  .  vitamin B-12 (CYANOCOBALAMIN) 100 MCG tablet, Take 100 mcg by mouth daily., Disp: , Rfl:  .  Vitamin D, Cholecalciferol, 400 UNITS CHEW, Chew 1 tablet by mouth daily., Disp: , Rfl:   Review of Systems  Constitutional: Negative for appetite change, chills, fatigue and fever.  Respiratory: Negative for chest tightness and shortness of breath.   Cardiovascular: Negative for chest pain and palpitations.  Gastrointestinal: Negative for abdominal pain, nausea and vomiting.  Neurological: Negative for dizziness and weakness.    Social History   Tobacco Use  . Smoking status: Former Smoker    Types: Cigarettes    Last attempt to quit: 10/06/2001    Years since quitting: 16.0  . Smokeless tobacco: Never Used  Substance Use Topics  . Alcohol use: No    Alcohol/week: 0.0 oz   Objective:   BP 118/70 (BP Location: Left Arm, Patient  Position: Sitting, Cuff Size: Large)   Pulse 78   Temp 97.6 F (36.4 C) (Oral)   Resp 16   SpO2 95%  Vitals:   10/21/17 0832  BP: 118/70  Pulse: 78  Resp: 16  Temp: 97.6 F (36.4 C)  TempSrc: Oral  SpO2: 95%     Physical Exam  General Appearance:    Alert, cooperative, no distress, obese  Eyes:    PERRL, conjunctiva/corneas clear, EOM's intact       Lungs:     Clear to auscultation bilaterally, respirations unlabored  Heart:    Regular rate and rhythm  Skin:     Neurologic:   Awake, alert, oriented x 3. No apparent focal neurological           defect.          Assessment & Plan:     1. Type 2 diabetes mellitus with other diabetic kidney complication, without long-term current use of insulin (HCC) Put metformin on hold for now due to diarrhea.  - Hemoglobin A1c  2. COPD, moderate ([place]) Well controlled.  Change inhaler due to insurance.  - albuterol (PROAIR HFA) 108 (90 Base) MCG/ACT inhaler; Inhale 2 puffs into the lungs every 6 (six) hours as needed  for wheezing or shortness of breath.  Dispense: 1 Inhaler; Refill: 5  3. Hypothyroidism, unspecified type  - TSH  4. Chronic kidney disease (CKD), stage III (moderate) (HCC)  - Comprehensive metabolic panel  5. Gastroesophageal reflux disease, esophagitis presence not specified Continue Nexium. Put alendronate on hold due to belching and acid taste in mouth.   6. Pruritus No discrete rash. May by medication reaction, may related to underlying allergies. Try adding OTC cetirizine - Comprehensive metabolic panel - CBC  7. Vitamin D deficiency  - VITAMIN D 25 Hydroxy (Vit-D Deficiency, Fractures)  8. Hyperlipidemia, unspecified hyperlipidemia type She is tolerating lovastatin well with no adverse effects.   - Comprehensive metabolic panel - Lipid panel  9. Essential hypertension Well controlled.  Continue current medications.   - Comprehensive metabolic panel  Will schedule follow up after reviewing labs.       Lelon Huh, MD  [facility]

## 2017-10-21 NOTE — Patient Instructions (Addendum)
   Try OTC cetirizine (generic Zyrtec) for itchy eyes and allergies   Stop Alendronate for a month to see if this helps with reflux symptoms   Stop metformin for 2 weeks to see if this helps with diarrhea.

## 2017-10-22 ENCOUNTER — Telehealth: Payer: Self-pay | Admitting: *Deleted

## 2017-10-22 LAB — COMPREHENSIVE METABOLIC PANEL
ALT: 12 IU/L (ref 0–32)
AST: 16 IU/L (ref 0–40)
Albumin/Globulin Ratio: 1.6 (ref 1.2–2.2)
Albumin: 4 g/dL (ref 3.5–4.7)
Alkaline Phosphatase: 65 IU/L (ref 39–117)
BUN/Creatinine Ratio: 12 (ref 12–28)
BUN: 13 mg/dL (ref 8–27)
Bilirubin Total: 0.4 mg/dL (ref 0.0–1.2)
CO2: 24 mmol/L (ref 20–29)
Calcium: 9.1 mg/dL (ref 8.7–10.3)
Chloride: 102 mmol/L (ref 96–106)
Creatinine, Ser: 1.07 mg/dL — ABNORMAL HIGH (ref 0.57–1.00)
GFR calc Af Amer: 56 mL/min/{1.73_m2} — ABNORMAL LOW (ref >59)
GFR calc non Af Amer: 49 mL/min/{1.73_m2} — ABNORMAL LOW (ref >59)
Globulin, Total: 2.5 g/dL (ref 1.5–4.5)
Glucose: 98 mg/dL (ref 65–99)
Potassium: 4 mmol/L (ref 3.5–5.2)
Sodium: 141 mmol/L (ref 134–144)
Total Protein: 6.5 g/dL (ref 6.0–8.5)

## 2017-10-22 LAB — CBC
Hematocrit: 35.4 % (ref 34.0–46.6)
Hemoglobin: 11.6 g/dL (ref 11.1–15.9)
MCH: 29.5 pg (ref 26.6–33.0)
MCHC: 32.8 g/dL (ref 31.5–35.7)
MCV: 90 fL (ref 79–97)
Platelets: 299 10*3/uL (ref 150–379)
RBC: 3.93 x10E6/uL (ref 3.77–5.28)
RDW: 12.9 % (ref 12.3–15.4)
WBC: 6.9 10*3/uL (ref 3.4–10.8)

## 2017-10-22 LAB — HEMOGLOBIN A1C
Est. average glucose Bld gHb Est-mCnc: 126 mg/dL
Hgb A1c MFr Bld: 6 % — ABNORMAL HIGH (ref 4.8–5.6)

## 2017-10-22 LAB — LIPID PANEL
Chol/HDL Ratio: 3 ratio (ref 0.0–4.4)
Cholesterol, Total: 148 mg/dL (ref 100–199)
HDL: 49 mg/dL (ref >39)
LDL Calculated: 65 mg/dL (ref 0–99)
Triglycerides: 171 mg/dL — ABNORMAL HIGH (ref 0–149)
VLDL Cholesterol Cal: 34 mg/dL (ref 5–40)

## 2017-10-22 LAB — TSH: TSH: 1.01 u[IU]/mL (ref 0.450–4.500)

## 2017-10-22 LAB — VITAMIN D 25 HYDROXY (VIT D DEFICIENCY, FRACTURES): Vit D, 25-Hydroxy: 25.9 ng/mL — ABNORMAL LOW (ref 30.0–100.0)

## 2017-10-22 MED ORDER — RABEPRAZOLE SODIUM 20 MG PO TBEC: 20.0000 mg | DELAYED_RELEASE_TABLET | Freq: Every day | ORAL | 3 refills | Status: DC

## 2017-10-22 NOTE — Telephone Encounter (Signed)
-----   Message from Malva Limesonald E Fisher, MD sent at 10/22/2017  7:47 AM EDT ----- a1c is very good at 6.0. Ok to stop metformin for awhile, let me know if diarrhea does not go away in a week or two. Cholesterol is very good. Vitamin d levels are low, need to start back on vitamin d supplements. Scheduled follow up for diabetes in 3 months.

## 2017-10-22 NOTE — Telephone Encounter (Signed)
LMOVM for pt to return call 

## 2017-10-22 NOTE — Telephone Encounter (Signed)
Pt returned call and request call back before we close today if possible. Please advise. Thanks TNP

## 2017-10-22 NOTE — Telephone Encounter (Signed)
Patient was notified of results. Expressed understanding. Patient wanted to know if Dr. Sherrie MustacheFisher would change esomeprazole to a different medication due to it not being effective any longer? Please advise?

## 2017-10-28 ENCOUNTER — Telehealth: Payer: Self-pay | Admitting: Family Medicine

## 2017-10-28 NOTE — Telephone Encounter (Signed)
Pharmacy was advised to dispense brand name Proair.

## 2017-10-28 NOTE — Telephone Encounter (Signed)
Please call [facility] and advise them to dispense brand name Proair as patient states it is covered by her insurance.

## 2017-10-28 NOTE — Telephone Encounter (Signed)
Pt called back saying she spoke with her insurance and they will accept Ventolin HFA but not the albuterol.  Thanks Fortune Brandsteri

## 2017-10-28 NOTE — Telephone Encounter (Signed)
Please advise 

## 2017-10-28 NOTE — Telephone Encounter (Signed)
Pt stated that she got another letter from her insurance that they will not cover albuterol (PROAIR HFA) 108 (90 Base) MCG/ACT inhaler the next time she gets the medication filled. Pt stated that her insurance will cover [employer] HFA. I tired to explain that the inhaler was the generic for [employer]. Pt stated that she understood put her insurance has advised they won't cover it. Pt is requesting that Rx for PROAIR HFA [facility]. Please advise. Thanks TNP

## 2017-11-03 ENCOUNTER — Telehealth: Payer: Self-pay | Admitting: Family Medicine

## 2017-11-03 NOTE — Telephone Encounter (Signed)
Please advise 

## 2017-11-03 NOTE — Telephone Encounter (Signed)
Patient was advised. Patient wanted to what she should do about taking a medication for her diabetes? Please advise?

## 2017-11-03 NOTE — Telephone Encounter (Signed)
Pt called to say she has been off the metformin, alendronate and she has not had any diarrhea since she went off them.  She still has a little rash.  She was to report back after two weeks  Pt's call back is [phone]   Thanks teri

## 2017-11-03 NOTE — Telephone Encounter (Signed)
Stay off of metformin. Restart alendronate. Call if GI symptoms return or if rash flares back up.

## 2017-11-04 NOTE — Telephone Encounter (Signed)
Line busy, will try again later

## 2017-11-04 NOTE — Telephone Encounter (Signed)
She'll be OK off diabetes medications for a little while. She should check her sugar every day and let me know if it gets over 200.

## 2017-11-05 NOTE — Telephone Encounter (Signed)
Patient was advised. Expressed understanding.  

## 2017-11-07 ENCOUNTER — Other Ambulatory Visit: Payer: Self-pay | Admitting: Family Medicine

## 2017-11-07 MED ORDER — ALBUTEROL SULFATE HFA 108 (90 BASE) MCG/ACT IN AERS: 2.0000 | INHALATION_SPRAY | Freq: Four times a day (QID) | RESPIRATORY_TRACT | 5 refills | Status: DC | PRN

## 2017-11-07 MED ORDER — TRAMADOL HCL 50 MG PO TABS: 50.0000 mg | ORAL_TABLET | Freq: Three times a day (TID) | ORAL | 5 refills | Status: DC | PRN

## 2017-11-07 NOTE — Progress Notes (Signed)
PER [employer], PROAIR ONLY COVERED VIA MAIL ORDER. VENTOLIN PREFERRED AND COVERED WHEN DISPENSED FROM LOCAL PHARMACY.

## 2017-11-10 ENCOUNTER — Telehealth: Payer: Self-pay | Admitting: Family Medicine

## 2017-11-10 ENCOUNTER — Other Ambulatory Visit: Payer: Self-pay | Admitting: Family Medicine

## 2017-11-10 MED ORDER — LEVOTHYROXINE SODIUM 75 MCG PO TABS: 75.0000 ug | ORAL_TABLET | Freq: Every day | ORAL | 4 refills | Status: DC

## 2017-11-10 MED ORDER — LOVASTATIN 40 MG PO TABS: 40.0000 mg | ORAL_TABLET | Freq: Every day | ORAL | 4 refills | Status: DC

## 2017-11-10 MED ORDER — MONTELUKAST SODIUM 10 MG PO TABS: 10.0000 mg | ORAL_TABLET | Freq: Every day | ORAL | 4 refills | Status: DC

## 2017-11-10 NOTE — Telephone Encounter (Signed)
Patient took Fosamax on Saturday morning and by Sunday, she was itching and broke out in a rash.  She said she cant take it.   Has started back on the Metformin also.

## 2017-11-10 NOTE — Telephone Encounter (Addendum)
[facility] pharmacy faxed a refill request for the following medications. Thanks CC  lovastatin (MEVACOR) 40 MG tablet   levothyroxine (SYNTHROID, LEVOTHROID) 75 MCG tablet   montelukast (SINGULAIR) 10 MG tablet   furosemide (LASIX) 20 MG tablet   lisinopril (PRINIVIL,ZESTRIL) 5 MG tablet

## 2017-11-10 NOTE — Telephone Encounter (Signed)
Patient stated that she started back on metformin last week due to her blood sugars running at 150-160, no rash appeared. Patient stated that she started back on fosamax Saturday and by Sunday the rash had returned and she was itching all over.

## 2017-11-11 ENCOUNTER — Other Ambulatory Visit: Payer: Self-pay | Admitting: *Deleted

## 2017-11-11 DIAGNOSIS — I10 Essential (primary) hypertension: Secondary | ICD-10-CM

## 2017-11-11 MED ORDER — RISEDRONATE SODIUM 35 MG PO TABS: 35.0000 mg | ORAL_TABLET | ORAL | 3 refills | Status: DC

## 2017-11-11 MED ORDER — FUROSEMIDE 20 MG PO TABS: 20.0000 mg | ORAL_TABLET | Freq: Every day | ORAL | 3 refills | Status: DC | PRN

## 2017-11-11 MED ORDER — METFORMIN HCL ER 500 MG PO TB24: 500.0000 mg | ORAL_TABLET | Freq: Every day | ORAL | 0 refills | Status: DC

## 2017-11-11 MED ORDER — LISINOPRIL 5 MG PO TABS: 5.0000 mg | ORAL_TABLET | Freq: Every day | ORAL | 4 refills | Status: DC

## 2017-11-11 NOTE — Telephone Encounter (Signed)
OK, try change to risedronate35mg  once a week for osteoporosis. , #12, rf x 3. Call if any problems with this medication

## 2017-11-11 NOTE — Telephone Encounter (Signed)
Advised patient as below. Medication was sent into the pharmacy.  

## 2017-11-14 ENCOUNTER — Other Ambulatory Visit: Payer: Self-pay | Admitting: Family Medicine

## 2017-11-14 ENCOUNTER — Telehealth: Payer: Self-pay | Admitting: Emergency Medicine

## 2017-11-14 MED ORDER — TRAMADOL HCL 50 MG PO TABS: 50.0000 mg | ORAL_TABLET | Freq: Three times a day (TID) | ORAL | 1 refills | Status: DC | PRN

## 2017-11-14 NOTE — Telephone Encounter (Signed)
Already addressed via fax.

## 2017-11-14 NOTE — Telephone Encounter (Signed)
[facility] pharmacy called wanting verification about filling Tramadol 50 mg for pt with a morphine allergy. Please advise. Thanks.   [employer] CB# [phone]

## 2017-12-16 ENCOUNTER — Telehealth: Payer: Self-pay | Admitting: Family Medicine

## 2017-12-16 NOTE — Telephone Encounter (Signed)
Called saying she dropped off a paper 3 weeks to [employer] to be filled out and sent back in.  She said they have not received it yet and she has up coming appts that she will need a ride for.  She needs this mailed asap  Pt's ca;; back [phone]  Thanks teri

## 2017-12-16 NOTE — Telephone Encounter (Signed)
Please advise 

## 2017-12-17 NOTE — Telephone Encounter (Signed)
Left message notifying pt letter has been mailed.

## 2017-12-17 NOTE — Telephone Encounter (Signed)
Form is complete. Please mail ASAP. Thanks.

## 2017-12-23 ENCOUNTER — Other Ambulatory Visit: Payer: Self-pay

## 2017-12-23 NOTE — Telephone Encounter (Signed)
Updated pharmacy in chart [facility].

## 2017-12-31 DIAGNOSIS — E119 Type 2 diabetes mellitus without complications: Secondary | ICD-10-CM | POA: Diagnosis not present

## 2017-12-31 LAB — HM DIABETES EYE EXAM

## 2018-01-01 ENCOUNTER — Encounter: Payer: Self-pay | Admitting: *Deleted

## 2018-01-20 ENCOUNTER — Encounter: Payer: Self-pay | Admitting: Family Medicine

## 2018-01-20 ENCOUNTER — Ambulatory Visit (INDEPENDENT_AMBULATORY_CARE_PROVIDER_SITE_OTHER): Payer: Medicare HMO | Admitting: Family Medicine

## 2018-01-20 VITALS — BP 120/84 | HR 77 | Temp 97.9°F | Resp 16

## 2018-01-20 DIAGNOSIS — E1129 Type 2 diabetes mellitus with other diabetic kidney complication: Secondary | ICD-10-CM | POA: Diagnosis not present

## 2018-01-20 DIAGNOSIS — M810 Age-related osteoporosis without current pathological fracture: Secondary | ICD-10-CM

## 2018-01-20 DIAGNOSIS — S161XXA Strain of muscle, fascia and tendon at neck level, initial encounter: Secondary | ICD-10-CM

## 2018-01-20 DIAGNOSIS — J449 Chronic obstructive pulmonary disease, unspecified: Secondary | ICD-10-CM | POA: Diagnosis not present

## 2018-01-20 LAB — POCT GLYCOSYLATED HEMOGLOBIN (HGB A1C)
Est. average glucose Bld gHb Est-mCnc: 134
Hemoglobin A1C: 6.3 % — AB (ref 4.0–5.6)

## 2018-01-20 MED ORDER — METHOCARBAMOL 500 MG PO TABS: ORAL_TABLET | ORAL | 3 refills | Status: DC

## 2018-01-20 MED ORDER — METFORMIN HCL ER 500 MG PO TB24: 500.0000 mg | ORAL_TABLET | Freq: Every day | ORAL | 2 refills | Status: DC

## 2018-01-20 MED ORDER — FUROSEMIDE 20 MG PO TABS: 20.0000 mg | ORAL_TABLET | Freq: Every day | ORAL | 3 refills | Status: DC | PRN

## 2018-01-20 NOTE — Progress Notes (Signed)
Patient: Kayla Boyle Female    DOB: 04-Oct-1934   83 y.o.   MRN: [id number] Visit Date: 01/20/2018  Today's Provider: Lelon Huh, MD   Chief Complaint  Patient presents with  . Follow-up  . Diabetes  . Hypertension  . Hypothyroidism   Subjective:    HPI   Diabetes Mellitus Type II, Follow-up:   Lab Results  Component Value Date   HGBA1C 6.3 (A) 01/20/2018   HGBA1C 6.0 (H) 10/21/2017   HGBA1C 6.5 05/20/2017   Last seen for diabetes 3 months ago.  Management since then includes; labs checked. Advised to stop metformin for a while due to diarrhea. She reports good compliance with treatment. She is not having side effects. none Current symptoms include none and have been unchanged. Home blood sugar records: fasting range: 130  Episodes of hypoglycemia? no   Current Insulin Regimen: n/a Most Recent Eye Exam: 12/31/2017 Weight trend: stable Prior visit with dietician: no Current diet: well balanced Current exercise: none  ----------------------------------------------------------------   Hypertension, follow-up:  BP Readings from Last 3 Encounters:  01/20/18 120/84  10/21/17 118/70  05/20/17 116/62    She was last seen for hypertension 3 months ago.  BP at that visit was 118/70. Management since that visit includes; no changes.She reports good compliance with treatment. She is not having side effects. none She is not exercising. She is adherent to low salt diet.   Outside blood pressures are 150/45. She is experiencing none.  Patient denies none.   Cardiovascular risk factors include diabetes mellitus.  Use of agents associated with hypertension: none ----------------------------------------------------------------  COPD, moderate ([place]) From 10/21/2017-Changed inhaler due to insurance.  Gastroesophageal reflux disease, esophagitis presence not specified From 10/21/2017-Continue Nexium. Put alendronate on hold due to belching and acid taste in  mouth. Have since changed to Actonel which she states she is tolerating well    Osteoporosis From 11/10/2017-changed from fosamax to Risedronate 35 mg every week, due to reaction to fosamax.     Patient states she has had a kink in her neck on right side, for 2 months. Patient states pain radiates down into her her right shoulder. Has had no known injury.   Allergies  Allergen Reactions  . Fosamax [Alendronate Sodium] Rash  . Bee Venom Hives  . Codeine Rash and Swelling  . Penicillins Rash and Swelling     Current Outpatient Medications:  .  albuterol (VENTOLIN HFA) 108 (90 Base) MCG/ACT inhaler, Inhale 2 puffs into the lungs every 6 (six) hours as needed for wheezing or shortness of breath., Disp: 18 g, Rfl: 5 .  aspirin 81 MG tablet, Take 1 tablet by mouth daily., Disp: , Rfl:  .  Blood Glucose Monitoring Suppl (ACCU-CHEK COMPACT CARE KIT) KIT, As directed, Disp: 1 each, Rfl: 0 .  furosemide (LASIX) 20 MG tablet, Take 1 tablet (20 mg total) by mouth daily as needed (swelling)., Disp: 90 tablet, Rfl: 3 .  gabapentin (NEURONTIN) 100 MG capsule, Take 1-2 capsules (100-200 mg total) by mouth 3 (three) times daily., Disp: 90 capsule, Rfl: 3 .  glucose blood (ACCU-CHEK AVIVA PLUS) test strip, Use to check blood sugar once a day, Disp: 100 each, Rfl: 4 .  levothyroxine (SYNTHROID, LEVOTHROID) 75 MCG tablet, Take 1 tablet (75 mcg total) by mouth daily., Disp: 180 tablet, Rfl: 4 .  lisinopril (PRINIVIL,ZESTRIL) 5 MG tablet, Take 1 tablet (5 mg total) by mouth daily., Disp: 90 tablet, Rfl: 4 .  lovastatin (MEVACOR)  40 MG tablet, Take 1 tablet (40 mg total) by mouth at bedtime., Disp: 90 tablet, Rfl: 4 .  Magnesium Oxide 500 MG TABS, Take 1 tablet by mouth daily., Disp: , Rfl:  .  meclizine (ANTIVERT) 25 MG tablet, Take 1 tablet (25 mg total) by mouth 3 (three) times daily as needed for dizziness., Disp: 30 tablet, Rfl: 3 .  metFORMIN (GLUCOPHAGE-XR) 500 MG 24 hr tablet, Take 1 tablet (500 mg  total) by mouth daily., Disp: 3 tablet, Rfl: 0 .  methocarbamol (ROBAXIN) 500 MG tablet, TAKE TWO TABLETS BY MOUTH EVERY 6 HOURS AS NEEDED FOR MUSCLE SPASMS, Disp: 60 tablet, Rfl: 12 .  montelukast (SINGULAIR) 10 MG tablet, Take 1 tablet (10 mg total) by mouth daily., Disp: 90 tablet, Rfl: 4 .  naproxen (NAPROSYN) 375 MG tablet, Take 1 tablet (375 mg total) by mouth 2 (two) times daily with a meal., Disp: 30 tablet, Rfl: 0 .  RABEprazole (ACIPHEX) 20 MG tablet, Take 1 tablet (20 mg total) by mouth daily., Disp: 90 tablet, Rfl: 3 .  risedronate (ACTONEL) 35 MG tablet, Take 1 tablet (35 mg total) by mouth every 7 (seven) days. with water on empty stomach, nothing by mouth or lie down for next 30 minutes., Disp: 12 tablet, Rfl: 3 .  traMADol (ULTRAM) 50 MG tablet, Take 1-2 tablets (50-100 mg total) by mouth every 8 (eight) hours as needed., Disp: 90 tablet, Rfl: 1 .  vitamin B-12 (CYANOCOBALAMIN) 100 MCG tablet, Take 100 mcg by mouth daily., Disp: , Rfl:  .  Vitamin D, Cholecalciferol, 400 UNITS CHEW, Chew 1 tablet by mouth daily., Disp: , Rfl:   Review of Systems  Constitutional: Negative for appetite change, chills, fatigue and fever.  Respiratory: Negative for chest tightness and shortness of breath.   Cardiovascular: Negative for chest pain and palpitations.  Gastrointestinal: Negative for abdominal pain, nausea and vomiting.  Neurological: Negative for dizziness and weakness.    Social History   Tobacco Use  . Smoking status: Former Smoker    Types: Cigarettes    Last attempt to quit: 10/06/2001    Years since quitting: 16.3  . Smokeless tobacco: Never Used  Substance Use Topics  . Alcohol use: No    Alcohol/week: 0.0 oz   Objective:   BP 120/84 (BP Location: Right Arm, Patient Position: Sitting, Cuff Size: Normal)   Pulse 77   Temp 97.9 F (36.6 C) (Oral)   Resp 16   SpO2 94%  Vitals:   01/20/18 0842  BP: 120/84  Pulse: 77  Resp: 16  Temp: 97.9 F (36.6 C)  TempSrc: Oral    SpO2: 94%     Physical Exam   General Appearance:    Alert, cooperative, no distress  Eyes:    PERRL, conjunctiva/corneas clear, EOM's intact       Lungs:     Clear to auscultation bilaterally, respirations unlabored  Heart:    Regular rate and rhythm  MS:   Tender around left superior trapezius. No gross deformities. No muscle weakness.   Neurologic:   Awake, alert, oriented x 3. No apparent focal neurological           defect.       Results for orders placed or performed in visit on 01/20/18  POCT glycosylated hemoglobin (Hb A1C)  Result Value Ref Range   Hemoglobin A1C 6.3 (A) 4.0 - 5.6 %   HbA1c POC (<> result, manual entry)  4.0 - 5.6 %   HbA1c, POC (  prediabetic range)  5.7 - 6.4 %   HbA1c, POC (controlled diabetic range)  0.0 - 7.0 %   Est. average glucose Bld gHb Est-mCnc 134        Assessment & Plan:     1. Type 2 diabetes mellitus with other diabetic kidney complication, without long-term current use of insulin (HCC) Well controlled.  Continue current medications.   - POCT glycosylated hemoglobin (Hb A1C)  2. COPD, moderate ([place]) Doing well on current inhalers. Continue current medications.    3. Osteoporosis without current pathological fracture, unspecified osteoporosis type Tolerating change to risedronate well.   4. Neck strain.  Refill methocarbamol. Call if not effective.       Lelon Huh, MD  [facility]

## 2018-03-02 ENCOUNTER — Telehealth: Payer: Self-pay | Admitting: Family Medicine

## 2018-03-02 NOTE — Telephone Encounter (Signed)
Pt stated that she was in for OV on 01/20/18 about her neck pain. Pt stated that she has tried everything Dr. Sherrie MustacheFisher advised and nothing has helped. Pt stated she has tried the heat & muscle relaxer and it hasn't helped. Pt is requesting call back to discuss when else she should try. Please advise. Thanks TNP

## 2018-03-02 NOTE — Telephone Encounter (Signed)
Please advise 

## 2018-03-02 NOTE — Telephone Encounter (Signed)
Needs xray of c-spine

## 2018-03-03 NOTE — Telephone Encounter (Signed)
Patient was advised. Patient stated she wants to hold off on x-ray until first of next year due to patient not having insurance coverage at the moment. Apparently there was some sort of mix up with her insurance and she lost coverage.

## 2018-03-25 ENCOUNTER — Other Ambulatory Visit: Payer: Self-pay

## 2018-03-25 NOTE — Telephone Encounter (Signed)
Patient requesting 90 day supply.

## 2018-03-26 MED ORDER — ALBUTEROL SULFATE HFA 108 (90 BASE) MCG/ACT IN AERS: 2.0000 | INHALATION_SPRAY | Freq: Four times a day (QID) | RESPIRATORY_TRACT | 1 refills | Status: AC | PRN

## 2018-05-12 ENCOUNTER — Ambulatory Visit (INDEPENDENT_AMBULATORY_CARE_PROVIDER_SITE_OTHER): Payer: Medicare HMO | Admitting: Family Medicine

## 2018-05-12 ENCOUNTER — Encounter: Payer: Self-pay | Admitting: Family Medicine

## 2018-05-12 ENCOUNTER — Ambulatory Visit (INDEPENDENT_AMBULATORY_CARE_PROVIDER_SITE_OTHER): Payer: Medicare HMO

## 2018-05-12 VITALS — BP 126/68 | HR 86 | Temp 98.8°F | Ht 69.0 in

## 2018-05-12 DIAGNOSIS — K219 Gastro-esophageal reflux disease without esophagitis: Secondary | ICD-10-CM | POA: Diagnosis not present

## 2018-05-12 DIAGNOSIS — S161XXS Strain of muscle, fascia and tendon at neck level, sequela: Secondary | ICD-10-CM | POA: Diagnosis not present

## 2018-05-12 DIAGNOSIS — Z598 Other problems related to housing and economic circumstances: Secondary | ICD-10-CM

## 2018-05-12 DIAGNOSIS — E559 Vitamin D deficiency, unspecified: Secondary | ICD-10-CM | POA: Diagnosis not present

## 2018-05-12 DIAGNOSIS — M810 Age-related osteoporosis without current pathological fracture: Secondary | ICD-10-CM | POA: Diagnosis not present

## 2018-05-12 DIAGNOSIS — R197 Diarrhea, unspecified: Secondary | ICD-10-CM | POA: Diagnosis not present

## 2018-05-12 DIAGNOSIS — M1990 Unspecified osteoarthritis, unspecified site: Secondary | ICD-10-CM

## 2018-05-12 DIAGNOSIS — Z0000 Encounter for general adult medical examination without abnormal findings: Secondary | ICD-10-CM | POA: Diagnosis not present

## 2018-05-12 DIAGNOSIS — Z23 Encounter for immunization: Secondary | ICD-10-CM

## 2018-05-12 DIAGNOSIS — Z89511 Acquired absence of right leg below knee: Secondary | ICD-10-CM

## 2018-05-12 DIAGNOSIS — E785 Hyperlipidemia, unspecified: Secondary | ICD-10-CM

## 2018-05-12 DIAGNOSIS — E039 Hypothyroidism, unspecified: Secondary | ICD-10-CM | POA: Diagnosis not present

## 2018-05-12 DIAGNOSIS — G546 Phantom limb syndrome with pain: Secondary | ICD-10-CM | POA: Diagnosis not present

## 2018-05-12 DIAGNOSIS — E1129 Type 2 diabetes mellitus with other diabetic kidney complication: Secondary | ICD-10-CM

## 2018-05-12 DIAGNOSIS — Z599 Problem related to housing and economic circumstances, unspecified: Secondary | ICD-10-CM

## 2018-05-12 DIAGNOSIS — Z7409 Other reduced mobility: Secondary | ICD-10-CM | POA: Diagnosis not present

## 2018-05-12 LAB — POCT GLYCOSYLATED HEMOGLOBIN (HGB A1C): Hemoglobin A1C: 6.1 % — AB (ref 4.0–5.6)

## 2018-05-12 NOTE — Progress Notes (Signed)
Subjective:   Kayla Boyle is a 83 y.o. female who presents for Medicare Annual (Subsequent) preventive examination.  Review of Systems:  N/A  Cardiac Risk Factors include: advanced age (>31mn, >>47women);diabetes mellitus;hypertension;dyslipidemia     Objective:     Vitals: BP 126/68 (BP Location: Right Arm)   Pulse 86   Temp 98.8 F (37.1 C) (Oral)   Ht _0  (1.753 m)   BMI 23.63 kg/m   Body mass index is 23.63 kg/m.  Advanced Directives 05/12/2018 04/17/2017  Does Patient Have a Medical Advance Directive? Yes Yes  Type of Advance Directive Living will;Healthcare Power of [place]  Copy of [place]? No - copy requested No - copy requested    Tobacco Social History   Tobacco Use  Smoking Status Former Smoker  . Types: Cigarettes  . Last attempt to quit: 10/06/2001  . Years since quitting: 16.6  Smokeless Tobacco Never Used     Counseling given: Not Answered   Clinical Intake:  Pre-visit preparation completed: Yes  Pain : 0-10 Pain Score: 10-Worst pain ever Pain Type: Chronic pain Pain Location: Back Pain Descriptors / Indicators: Jabbing Pain Frequency: Constant    Nutrition Risk Assessment:  Has the patient had any N/V/D within the last 2 months?  Yes due to colitis. Does the patient have any non-healing wounds?  No  Has the patient had any unintentional weight loss or weight gain?  No   Diabetes:  Is the patient diabetic?  Yes  If diabetic, was a CBG obtained today?  No  Did the patient bring in their glucometer from home?  No  How often do you monitor your CBG's? 1-2xs a month.   Financial Strains and Diabetes Management:  Are you having any financial strains with the device, your supplies or your medication? No .  Does the patient want to be seen by Chronic Care Management for management of their diabetes?  No  Would the patient like to be referred to a Nutritionist or  for Diabetic Management?  No   Diabetic Exams:  Diabetic Eye Exam: Completed 12/31/17. Due yearly.  Diabetic Foot Exam: Pt has been advised about the importance in completing this exam. Pt needs this completed today.   Nutritional Risks: Nausea/ vomitting/ diarrhea(nausea and diarrhea intermittenly due to colitis.) Diabetes: Yes  How often do you need to have someone help you when you read instructions, pamphlets, or other written materials from your doctor or pharmacy?: 1 - Never  Interpreter Needed?: No  Information entered by :: [facility] L[facility]  Past Medical History:  Diagnosis Date  . Allergy   . Arthritis    osteo  . Diabetes mellitus without complication ([place]    type 2  . H/O acute poliomyelitis 11/24/2014  . History of chicken pox   . Hyperlipidemia   . Hypertension   . Hypothyroidism   . Shingles    Past Surgical History:  Procedure Laterality Date  . ABDOMINAL HYSTERECTOMY  1963  . APPENDECTOMY    . CARPAL TUNNEL RELEASE    . CHOLECYSTECTOMY  10/2003   laproscopic  . ELBOW SURGERY Bilateral   . HAND SURGERY  01/2010  . LEG AMPUTATION BELOW KNEE Right 1975   traumatic; bleow knee and club foot   Family History  Problem Relation Age of Onset  . Pancreatic cancer Father   . Prostate cancer Brother   . Diabetes Brother  type 2  . Hepatitis C Daughter   . HIV/AIDS Son   . Cancer Son        LYMPH NODE CANCER  . Cancer Other 57       small cell cancer   Social History   Socioeconomic History  . Marital status: Single    Spouse name: Not on file  . Number of children: 2  . Years of education: 69  . Highest education level: High school graduate  Occupational History  . Occupation: Disabled  . Occupation: retired  Scientific laboratory technician  . Financial resource strain: Not hard at all  . Food insecurity:    Worry: Never true    Inability: Never true  . Transportation needs:    Medical: No    Non-medical: No  Tobacco Use  . Smoking status: Former  Smoker    Types: Cigarettes    Last attempt to quit: 10/06/2001    Years since quitting: 16.6  . Smokeless tobacco: Never Used  Substance and Sexual Activity  . Alcohol use: No    Alcohol/week: 0.0 standard drinks  . Drug use: No  . Sexual activity: Not on file  Lifestyle  . Physical activity:    Days per week: 0 days    Minutes per session: 0 min  . Stress: Not at all  Relationships  . Social connections:    Talks on phone: Patient refused    Gets together: Patient refused    Attends religious service: Patient refused    Active member of club or organization: Patient refused    Attends meetings of clubs or organizations: Patient refused    Relationship status: Patient refused  Other Topics Concern  . Not on file  Social History Narrative  . Not on file    Outpatient Encounter Medications as of 05/12/2018  Medication Sig  . albuterol (VENTOLIN HFA) 108 (90 Base) MCG/ACT inhaler Inhale 2 puffs into the lungs every 6 (six) hours as needed for wheezing or shortness of breath.  Marland Kitchen aspirin 81 MG tablet Take 1 tablet by mouth daily.  . Blood Glucose Monitoring Suppl (ACCU-CHEK COMPACT CARE KIT) KIT As directed  . furosemide (LASIX) 20 MG tablet Take 1 tablet (20 mg total) by mouth daily as needed (swelling).  . gabapentin (NEURONTIN) 100 MG capsule Take 1-2 capsules (100-200 mg total) by mouth 3 (three) times daily.  Marland Kitchen glucose blood (ACCU-CHEK AVIVA PLUS) test strip Use to check blood sugar once a day  . levothyroxine (SYNTHROID, LEVOTHROID) 75 MCG tablet Take 1 tablet (75 mcg total) by mouth daily.  Marland Kitchen lisinopril (PRINIVIL,ZESTRIL) 5 MG tablet Take 1 tablet (5 mg total) by mouth daily.  Marland Kitchen lovastatin (MEVACOR) 40 MG tablet Take 1 tablet (40 mg total) by mouth at bedtime.  . meclizine (ANTIVERT) 25 MG tablet Take 1 tablet (25 mg total) by mouth 3 (three) times daily as needed for dizziness.  . metFORMIN (GLUCOPHAGE-XR) 500 MG 24 hr tablet Take 1 tablet (500 mg total) by mouth daily.  .  methocarbamol (ROBAXIN) 500 MG tablet TAKE TWO TABLETS BY MOUTH EVERY 6 HOURS AS NEEDED FOR MUSCLE SPASMS  . montelukast (SINGULAIR) 10 MG tablet Take 1 tablet (10 mg total) by mouth daily.  . naproxen (NAPROSYN) 375 MG tablet Take 1 tablet (375 mg total) by mouth 2 (two) times daily with a meal.  . RABEprazole (ACIPHEX) 20 MG tablet Take 1 tablet (20 mg total) by mouth daily.  . risedronate (ACTONEL) 35 MG tablet Take 1 tablet (  35 mg total) by mouth every 7 (seven) days. with water on empty stomach, nothing by mouth or lie down for next 30 minutes.  . traMADol (ULTRAM) 50 MG tablet Take 1-2 tablets (50-100 mg total) by mouth every 8 (eight) hours as needed.  . vitamin B-12 (CYANOCOBALAMIN) 100 MCG tablet Take 800 mcg by mouth daily.   . Vitamin D, Cholecalciferol, 400 UNITS CHEW Chew 1 tablet by mouth daily.  . Magnesium Oxide 500 MG TABS Take 1 tablet by mouth daily.   No facility-administered encounter medications on file as of 05/12/2018.     Activities of Daily Living In your present state of health, do you have any difficulty performing the following activities: 05/12/2018  Hearing? N  Vision? Y  Comment Has cataracts in both eyes.   Difficulty concentrating or making decisions? N  Walking or climbing stairs? Y  Comment Due to being in an electric wheelchair from a leg amputation.   Dressing or bathing? N  Doing errands, shopping? N  Preparing Food and eating ? N  Using the Toilet? N  In the past six months, have you accidently leaked urine? N  Do you have problems with loss of bowel control? N  Managing your Medications? N  Managing your Finances? N  Housekeeping or managing your Housekeeping? N  Some recent data might be hidden    Patient Care Team: Birdie Sons, MD as PCP - General (Family Medicine) Erby Pian, MD as Referring Physician (Pulmonary Disease) Eulogio Bear, MD as Consulting Physician (Ophthalmology) Earnestine Leys, MD (Orthopedic Surgery)      Assessment:   This is a routine wellness examination for [facility].  Exercise Activities and Dietary recommendations Current Exercise Habits: The patient does not participate in regular exercise at present, Exercise limited by: orthopedic condition(s);neurologic condition(s)  Goals    . Increase water intake     Recommend increasing water intake to 6 glasses of water a day.     Marland Kitchen LIFESTYLE - DECREASE FALLS RISK     Recommend to remove any items from the home that may cause slips or trips.          Fall Risk Fall Risk  05/12/2018 04/17/2017 01/18/2016 01/18/2016 01/02/2015  Falls in the past year? 1 Yes No No No  Number falls in past yr: 0 1 - - -  Injury with Fall? 0 No - - -  Risk for fall due to : Impaired balance/gait - - - -  Follow up - Falls prevention discussed - - -   FALL RISK PREVENTION PERTAINING TO THE HOME:  Any stairs in or around the home WITH handrails? No  Home free of loose throw rugs in walkways, pet beds, electrical cords, etc? Yes  Adequate lighting in your home to reduce risk of falls? Yes   ASSISTIVE DEVICES UTILIZED TO PREVENT FALLS:  Life alert? No  Use of a cane, walker or w/c? Yes  Grab bars in the bathroom? Yes  Shower chair or bench in shower? Yes Elevated toilet seat or a handicapped toilet? No    TIMED UP AND GO:  Was the test performed? No .     Depression Screen PHQ 2/9 Scores 05/12/2018 05/12/2018 04/17/2017 04/17/2017  PHQ - 2 Score 0 0 0 0  PHQ- 9 Score 8 - 5 -     Cognitive Function     6CIT Screen 05/12/2018 04/17/2017  What Year? 0 points 0 points  What month? 0 points 0 points  What time? 0 points 0 points  Count back from 20 0 points 0 points  Months in reverse 0 points 0 points  Repeat phrase 0 points 0 points  Total Score 0 0    Immunization History  Administered Date(s) Administered  . Influenza, High Dose Seasonal PF 03/23/2015, 04/11/2016, 04/17/2017  . Pneumococcal Conjugate-13 08/26/2013  . Pneumococcal  Polysaccharide-23 06/13/2005  . Td 02/24/2008  . Tdap 03/26/2011  . Zoster 07/19/2010    Qualifies for Shingles Vaccine? Yes  Zostavax completed 07/19/10. Due for Shingrix. Education has been provided regarding the importance of this vaccine. Pt has been advised to call insurance company to determine out of pocket expense. Advised may also receive vaccine at local pharmacy or Health Dept. Verbalized acceptance and understanding.  Tdap: Up to date  Flu Vaccine: Due for Flu vaccine. Does the patient want to receive this vaccine today?  Yes    Pneumococcal Vaccine: Up to date   Screening Tests Health Maintenance  Topic Date Due  . FOOT EXAM  07/12/1944  . DEXA SCAN  05/27/2018  . HEMOGLOBIN A1C  07/23/2018  . OPHTHALMOLOGY EXAM  01/01/2019  . TETANUS/TDAP  03/25/2021  . INFLUENZA VACCINE  Completed  . PNA vac Low Risk Adult  Completed   Cancer Screenings:  Colorectal Screening: No longer required.  Mammogram: No longer required  Bone Density: Completed 05/2017. Results reflect OSTEOPOROSIS. Repeat every once a year. Pt declined a future order for a DEXA scan today.  Lung Cancer Screening: (Low Dose CT Chest recommended if Age 62-80 years, 30 pack-year currently smoking OR have quit w/in 15years.) does not qualify.   Additional Screening:  Vision Screening: Recommended annual ophthalmology exams for early detection of glaucoma and other disorders of the eye.  Dental Screening: Recommended annual dental exams for proper oral hygiene  Community Resource Referral:  CRR required this visit? Yes, for assistance with housework.      Plan:  I have personally reviewed and addressed the Medicare Annual Wellness questionnaire and have noted the following in the patient's chart:  A. Medical and social history B. Use of alcohol, tobacco or illicit drugs  C. Current medications and supplements D. Functional ability and status E.  Nutritional status F.  Physical  activity G. Advance directives H. List of other physicians I.  Hospitalizations, surgeries, and ER visits in previous 12 months J.  [place] such as hearing and vision if needed, cognitive and depression L. Referrals and appointments - none  In addition, I have reviewed and discussed with patient certain preventive protocols, quality metrics, and best practice recommendations. A written personalized care plan for preventive services as well as general preventive health recommendations were provided to patient.  See attached scanned questionnaire for additional information.   Signed,  Fabio Neighbors, LPN Nurse Health Advisor   Nurse Recommendations: Pt needs a diabetic foot exam today. Pt declined a future order for her DEXA scan.

## 2018-05-12 NOTE — Patient Instructions (Addendum)
Ms. Kayla Boyle , Thank you for taking time to come for your Medicare Wellness Visit. I appreciate your ongoing commitment to your health goals. Please review the following plan we discussed and let me know if I can assist you in the future.   Screening recommendations/referrals: Colonoscopy: No longer required.  Mammogram: No longer required.  Bone Density: Up to date, due 05/27/18. Declined referral today. Recommended yearly ophthalmology/optometry visit for glaucoma screening and checkup Recommended yearly dental visit for hygiene and checkup  Vaccinations: Influenza vaccine: Administered today. Pneumococcal vaccine: Completed series. Tdap vaccine: Up to date, due 03/2021. Shingles vaccine: Pt declines today.     Advanced directives: Please bring a copy of your POA (Power of Attorney) and/or Living Will to your next appointment.   Conditions/risks identified: Fall risk prevention- recommend to remove any items from the home that may cause slips or trips.  Next appointment: 10:00 AM today with Dr Sherrie Mustache.    Preventive Care 81 Years and Older, Female Preventive care refers to lifestyle choices and visits with your health care provider that can promote health and wellness. What does preventive care include?  A yearly physical exam. This is also called an annual well check.  Dental exams once or twice a year.  Routine eye exams. Ask your health care provider how often you should have your eyes checked.  Personal lifestyle choices, including:  Daily care of your teeth and gums.  Regular physical activity.  Eating a healthy diet.  Avoiding tobacco and drug use.  Limiting alcohol use.  Practicing safe sex.  Taking low-dose aspirin every day.  Taking vitamin and mineral supplements as recommended by your health care provider. What happens during an annual well check? The services and screenings done by your health care provider during your annual well check will depend on  your age, overall health, lifestyle risk factors, and family history of disease. Counseling  Your health care provider may ask you questions about your:  Alcohol use.  Tobacco use.  Drug use.  Emotional well-being.  Home and relationship well-being.  Sexual activity.  Eating habits.  History of falls.  Memory and ability to understand (cognition).  Work and work Astronomer.  Reproductive health. Screening  You may have the following tests or measurements:  Height, weight, and BMI.  Blood pressure.  Lipid and cholesterol levels. These may be checked every 5 years, or more frequently if you are over 81 years old.  Skin check.  Lung cancer screening. You may have this screening every year starting at age 81 if you have a 30-pack-year history of smoking and currently smoke or have quit within the past 15 years.  Fecal occult blood test (FOBT) of the stool. You may have this test every year starting at age 81.  Flexible sigmoidoscopy or colonoscopy. You may have a sigmoidoscopy every 5 years or a colonoscopy every 10 years starting at age 81.  Hepatitis C blood test.  Hepatitis B blood test.  Sexually transmitted disease (STD) testing.  Diabetes screening. This is done by checking your blood sugar (glucose) after you have not eaten for a while (fasting). You may have this done every 1-3 years.  Bone density scan. This is done to screen for osteoporosis. You may have this done starting at age 81.  Mammogram. This may be done every 1-2 years. Talk to your health care provider about how often you should have regular mammograms. Talk with your health care provider about your test results, treatment options, and if  necessary, the need for more tests. Vaccines  Your health care provider may recommend certain vaccines, such as:  Influenza vaccine. This is recommended every year.  Tetanus, diphtheria, and acellular pertussis (Tdap, Td) vaccine. You may need a Td booster  every 10 years.  Zoster vaccine. You may need this after age 75.  Pneumococcal 13-valent conjugate (PCV13) vaccine. One dose is recommended after age 81.  Pneumococcal polysaccharide (PPSV23) vaccine. One dose is recommended after age 81. Talk to your health care provider about which screenings and vaccines you need and how often you need them. This information is not intended to replace advice given to you by your health care provider. Make sure you discuss any questions you have with your health care provider. Document Released: 07/21/2015 Document Revised: 03/13/2016 Document Reviewed: 04/25/2015 Elsevier Interactive Patient Education  2017 [place] Prevention in the Home Falls can cause injuries. They can happen to people of all ages. There are many things you can do to make your home safe and to help prevent falls. What can I do on the outside of my home?  Regularly fix the edges of walkways and driveways and fix any cracks.  Remove anything that might make you trip as you walk through a door, such as a raised step or threshold.  Trim any bushes or trees on the path to your home.  Use bright outdoor lighting.  Clear any walking paths of anything that might make someone trip, such as rocks or tools.  Regularly check to see if handrails are loose or broken. Make sure that both sides of any steps have handrails.  Any raised decks and porches should have guardrails on the edges.  Have any leaves, snow, or ice cleared regularly.  Use sand or salt on walking paths during winter.  Clean up any spills in your garage right away. This includes oil or grease spills. What can I do in the bathroom?  Use night lights.  Install grab bars by the toilet and in the tub and shower. Do not use towel bars as grab bars.  Use non-skid mats or decals in the tub or shower.  If you need to sit down in the shower, use a plastic, non-slip stool.  Keep the floor dry. Clean up any  water that spills on the floor as soon as it happens.  Remove soap buildup in the tub or shower regularly.  Attach bath mats securely with double-sided non-slip rug tape.  Do not have throw rugs and other things on the floor that can make you trip. What can I do in the bedroom?  Use night lights.  Make sure that you have a light by your bed that is easy to reach.  Do not use any sheets or blankets that are too big for your bed. They should not hang down onto the floor.  Have a firm chair that has side arms. You can use this for support while you get dressed.  Do not have throw rugs and other things on the floor that can make you trip. What can I do in the kitchen?  Clean up any spills right away.  Avoid walking on wet floors.  Keep items that you use a lot in easy-to-reach places.  If you need to reach something above you, use a strong step stool that has a grab bar.  Keep electrical cords out of the way.  Do not use floor polish or wax that makes floors slippery. If you must  use wax, use non-skid floor wax.  Do not have throw rugs and other things on the floor that can make you trip. What can I do with my stairs?  Do not leave any items on the stairs.  Make sure that there are handrails on both sides of the stairs and use them. Fix handrails that are broken or loose. Make sure that handrails are as long as the stairways.  Check any carpeting to make sure that it is firmly attached to the stairs. Fix any carpet that is loose or worn.  Avoid having throw rugs at the top or bottom of the stairs. If you do have throw rugs, attach them to the floor with carpet tape.  Make sure that you have a light switch at the top of the stairs and the bottom of the stairs. If you do not have them, ask someone to add them for you. What else can I do to help prevent falls?  Wear shoes that:  Do not have high heels.  Have rubber bottoms.  Are comfortable and fit you well.  Are closed  at the toe. Do not wear sandals.  If you use a stepladder:  Make sure that it is fully opened. Do not climb a closed stepladder.  Make sure that both sides of the stepladder are locked into place.  Ask someone to hold it for you, if possible.  Clearly mark and make sure that you can see:  Any grab bars or handrails.  First and last steps.  Where the edge of each step is.  Use tools that help you move around (mobility aids) if they are needed. These include:  Canes.  Walkers.  Scooters.  Crutches.  Turn on the lights when you go into a dark area. Replace any light bulbs as soon as they burn out.  Set up your furniture so you have a clear path. Avoid moving your furniture around.  If any of your floors are uneven, fix them.  If there are any pets around you, be aware of where they are.  Review your medicines with your doctor. Some medicines can make you feel dizzy. This can increase your chance of falling. Ask your doctor what other things that you can do to help prevent falls. This information is not intended to replace advice given to you by your health care provider. Make sure you discuss any questions you have with your health care provider. Document Released: 04/20/2009 Document Revised: 11/30/2015 Document Reviewed: 07/29/2014 Elsevier Interactive Patient Education  2017 [employer].

## 2018-05-12 NOTE — Progress Notes (Signed)
Patient: Kayla Boyle, Female    DOB: 01-17-1935, 83 y.o.   MRN: [id number] Visit Date: 05/12/2018  Today's Provider: Lelon Huh, MD   Chief Complaint  Patient presents with  . Annual Exam  . Diabetes  . Hypertension  . Hyperlipidemia   Subjective:     Complete Physical Kayla Boyle is a 83 y.o. female. She feels well. She reports not exercising. She reports she is sleeping fairly well. Generally doing well. Has stiff, sore neck on left from time to time.   -----------------------------------------------------------   Diabetes Mellitus Type II, Follow-up:   Lab Results  Component Value Date   HGBA1C 6.3 (A) 01/20/2018   HGBA1C 6.0 (H) 10/21/2017   HGBA1C 6.5 05/20/2017   Last seen for diabetes 3 months ago.  Management since then includes None. She reports excellent compliance with treatment. She is not having side effects.  Current symptoms include none and have been stable. Home blood sugar records:  Episodes of hypoglycemia? yes - Pt reports having two-three lows this week.   Current Insulin Regimen: None Most Recent Eye Exam: 11/2017 Weight trend: stable Prior visit with dietician: no Current diet: in general, a "healthy" diet  , diabetic Current exercise: none  ------------------------------------------------------------------------   Hypertension, follow-up:  BP Readings from Last 3 Encounters:  05/12/18 126/68  05/12/18 126/68  01/20/18 120/84    She was last seen for hypertension 7 months ago.  BP at that visit was 118/70. Management since that visit includes None She reports excellent compliance with treatment. She is not having side effects.  She is not exercising. She is adherent to low salt diet.   Outside blood pressures are not checked regularly. . She is experiencing none.  Patient denies chest pain, chest pressure/discomfort, fatigue, palpitations and paroxysmal nocturnal dyspnea.   Cardiovascular risk factors include  advanced age (older than 50 for men, 61 for women), diabetes mellitus and dyslipidemia.  Use of agents associated with hypertension: none.   ------------------------------------------------------------------------    Lipid/Cholesterol, Follow-up:   Last seen for this 3 months ago.  Management since that visit includes None.  Last Lipid Panel:    Component Value Date/Time   CHOL 148 10/21/2017 0905   TRIG 171 (H) 10/21/2017 0905   HDL 49 10/21/2017 0905   CHOLHDL 3.0 10/21/2017 0905   LDLCALC 65 10/21/2017 0905    She reports excellent compliance with treatment. She is not having side effects.   Wt Readings from Last 3 Encounters:  05/20/17 160 lb (72.6 kg)  04/17/17 161 lb 3.2 oz (73.1 kg)  01/18/16 159 lb (72.1 kg)    ------------------------------------------------------------------------  Complains or more frequent diarrhea the last few months. Having several every day. No blood in stool. OTC diarrhea medications help.   Review of Systems  Constitutional: Positive for appetite change. Negative for activity change, chills, diaphoresis, fatigue, fever and unexpected weight change.  HENT: Positive for sinus pressure, sneezing and trouble swallowing. Negative for congestion, dental problem, drooling, ear discharge, ear pain, facial swelling, hearing loss, mouth sores, nosebleeds, postnasal drip, rhinorrhea, sinus pain, sore throat, tinnitus and voice change.   Eyes: Positive for itching. Negative for photophobia, pain, discharge, redness and visual disturbance.  Respiratory: Positive for cough and shortness of breath. Negative for apnea, choking, chest tightness, wheezing and stridor.   Cardiovascular: Negative.   Gastrointestinal: Positive for diarrhea. Negative for abdominal distention, abdominal pain, anal bleeding, blood in stool, constipation, nausea, rectal pain and vomiting.  Endocrine: Negative.  Genitourinary: Negative.   Musculoskeletal: Positive for joint  swelling and neck stiffness. Negative for arthralgias, back pain, gait problem and neck pain.  Skin: Negative.   Neurological: Negative.   Hematological: Negative.   Psychiatric/Behavioral: Negative.     Social History   Socioeconomic History  . Marital status: Single    Spouse name: Not on file  . Number of children: 2  . Years of education: 23  . Highest education level: High school graduate  Occupational History  . Occupation: Disabled  . Occupation: retired  Scientific laboratory technician  . Financial resource strain: Not hard at all  . Food insecurity:    Worry: Never true    Inability: Never true  . Transportation needs:    Medical: No    Non-medical: No  Tobacco Use  . Smoking status: Former Smoker    Types: Cigarettes    Last attempt to quit: 10/06/2001    Years since quitting: 16.6  . Smokeless tobacco: Never Used  Substance and Sexual Activity  . Alcohol use: No    Alcohol/week: 0.0 standard drinks  . Drug use: No  . Sexual activity: Not on file  Lifestyle  . Physical activity:    Days per week: 0 days    Minutes per session: 0 min  . Stress: Not at all  Relationships  . Social connections:    Talks on phone: Patient refused    Gets together: Patient refused    Attends religious service: Patient refused    Active member of club or organization: Patient refused    Attends meetings of clubs or organizations: Patient refused    Relationship status: Patient refused  . Intimate partner violence:    Fear of current or ex partner: Patient refused    Emotionally abused: Patient refused    Physically abused: Patient refused    Forced sexual activity: Patient refused  Other Topics Concern  . Not on file  Social History Narrative  . Not on file    Past Medical History:  Diagnosis Date  . Allergy   . Arthritis    osteo  . Diabetes mellitus without complication ([place])    type 2  . H/O acute poliomyelitis 11/24/2014  . History of chicken pox   . Hyperlipidemia   .  Hypertension   . Hypothyroidism   . Shingles      Patient Active Problem List   Diagnosis Date Noted  . Acquired trigger finger 05/28/2017  . Bursitis of shoulder 05/28/2017  . Pain, phantom limb ([place]) 10/17/2016  . Low back pain 10/24/2015  . Chronic cough 08/24/2015  . Allergic rhinitis 02/09/2015  . Arthritis of shoulder region, right 01/02/2015  . Arthritis 11/24/2014  . Chronic kidney disease (CKD), stage III (moderate) ([place]) 11/24/2014  . Club foot 11/24/2014  . Diabetes mellitus with renal complications ([place]) 95/63/8756  . Eczema of hand 11/24/2014  . GERD (gastroesophageal reflux disease) 11/24/2014  . Arthralgia of hip 11/24/2014  . Hyperlipidemia 11/24/2014  . Hypertension 11/24/2014  . Hypomagnesemia 11/24/2014  . Hypothyroid 11/24/2014  . IBS (irritable bowel syndrome) 11/24/2014  . Mobility impaired 11/24/2014  . Restless leg 11/24/2014  . Restrictive lung disease 11/24/2014  . Status post below knee amputation 11/24/2014  . Scoliosis 11/24/2014  . Vitamin D deficiency 11/24/2014  . COPD, moderate ([place]) 12/16/2013  . OP (osteoporosis) 09/02/2011    Past Surgical History:  Procedure Laterality Date  . ABDOMINAL HYSTERECTOMY  1963  . APPENDECTOMY    . CARPAL TUNNEL RELEASE    .  CHOLECYSTECTOMY  10/2003   laproscopic  . ELBOW SURGERY Bilateral   . HAND SURGERY  01/2010  . LEG AMPUTATION BELOW KNEE Right 1975   traumatic; bleow knee and club foot    Her family history includes Cancer in her son; Cancer (age of onset: 4) in her other; Diabetes in her brother; HIV/AIDS in her son; Hepatitis C in her daughter; Pancreatic cancer in her father; Prostate cancer in her brother.      Current Outpatient Medications:  .  albuterol (VENTOLIN HFA) 108 (90 Base) MCG/ACT inhaler, Inhale 2 puffs into the lungs every 6 (six) hours as needed for wheezing or shortness of breath., Disp: 54 g, Rfl: 1 .  aspirin 81 MG tablet, Take 1 tablet by mouth daily., Disp: , Rfl:  .   Blood Glucose Monitoring Suppl (ACCU-CHEK COMPACT CARE KIT) KIT, As directed, Disp: 1 each, Rfl: 0 .  furosemide (LASIX) 20 MG tablet, Take 1 tablet (20 mg total) by mouth daily as needed (swelling)., Disp: 90 tablet, Rfl: 3 .  gabapentin (NEURONTIN) 100 MG capsule, Take 1-2 capsules (100-200 mg total) by mouth 3 (three) times daily., Disp: 90 capsule, Rfl: 3 .  glucose blood (ACCU-CHEK AVIVA PLUS) test strip, Use to check blood sugar once a day, Disp: 100 each, Rfl: 4 .  levothyroxine (SYNTHROID, LEVOTHROID) 75 MCG tablet, Take 1 tablet (75 mcg total) by mouth daily., Disp: 180 tablet, Rfl: 4 .  lisinopril (PRINIVIL,ZESTRIL) 5 MG tablet, Take 1 tablet (5 mg total) by mouth daily., Disp: 90 tablet, Rfl: 4 .  lovastatin (MEVACOR) 40 MG tablet, Take 1 tablet (40 mg total) by mouth at bedtime., Disp: 90 tablet, Rfl: 4 .  Magnesium Oxide 500 MG TABS, Take 1 tablet by mouth daily., Disp: , Rfl:  .  meclizine (ANTIVERT) 25 MG tablet, Take 1 tablet (25 mg total) by mouth 3 (three) times daily as needed for dizziness., Disp: 30 tablet, Rfl: 3 .  metFORMIN (GLUCOPHAGE-XR) 500 MG 24 hr tablet, Take 1 tablet (500 mg total) by mouth daily., Disp: 90 tablet, Rfl: 2 .  methocarbamol (ROBAXIN) 500 MG tablet, TAKE TWO TABLETS BY MOUTH EVERY 6 HOURS AS NEEDED FOR MUSCLE SPASMS, Disp: 60 tablet, Rfl: 3 .  montelukast (SINGULAIR) 10 MG tablet, Take 1 tablet (10 mg total) by mouth daily., Disp: 90 tablet, Rfl: 4 .  naproxen (NAPROSYN) 375 MG tablet, Take 1 tablet (375 mg total) by mouth 2 (two) times daily with a meal., Disp: 30 tablet, Rfl: 0 .  RABEprazole (ACIPHEX) 20 MG tablet, Take 1 tablet (20 mg total) by mouth daily., Disp: 90 tablet, Rfl: 3 .  risedronate (ACTONEL) 35 MG tablet, Take 1 tablet (35 mg total) by mouth every 7 (seven) days. with water on empty stomach, nothing by mouth or lie down for next 30 minutes., Disp: 12 tablet, Rfl: 3 .  traMADol (ULTRAM) 50 MG tablet, Take 1-2 tablets (50-100 mg total) by  mouth every 8 (eight) hours as needed., Disp: 90 tablet, Rfl: 1 .  vitamin B-12 (CYANOCOBALAMIN) 100 MCG tablet, Take 800 mcg by mouth daily. , Disp: , Rfl:  .  Vitamin D, Cholecalciferol, 400 UNITS CHEW, Chew 1 tablet by mouth daily., Disp: , Rfl:   Patient Care Team: Birdie Sons, MD as PCP - General (Family Medicine) Erby Pian, MD as Referring Physician (Pulmonary Disease) Eulogio Bear, MD as Consulting Physician (Ophthalmology) Earnestine Leys, MD (Orthopedic Surgery)     Objective:   Vitals: BP 126/68  Pulse 86   Temp 98.8 F (37.1 C) (Oral)   Ht '5\' 9"'$  (1.753 m)   BMI 23.63 kg/m   Physical Exam   General Appearance:    Alert, cooperative, no distress, appears stated age  Head:    Normocephalic, without obvious abnormality, atraumatic  Eyes:    PERRL, conjunctiva/corneas clear, EOM's intact, fundi    benign, both eyes  Ears:    Normal TM's and external ear canals, both ears  Nose:   Nares normal, septum midline, mucosa normal, no drainage    or sinus tenderness  Throat:   Lips, mucosa, and tongue normal; teeth and gums normal  Neck:   Supple, symmetrical, trachea midline, no adenopathy;    thyroid:  no enlargement/tenderness/nodules; no carotid   bruit or JVD  Back:     Symmetric, no curvature, ROM normal, no CVA tenderness  Lungs:     Clear to auscultation bilaterally, respirations unlabored  Chest Wall:    No tenderness or deformity   Heart:    Regular rate and rhythm, S1 and S2 normal, no murmur, rub   or gallop  Breast Exam:    deferred  Abdomen:     Soft, non-tender, bowel sounds active all four quadrants,    no masses, no organomegaly  Pelvic:    deferred  Extremities:   Extremities normal, atraumatic, no cyanosis or edema. S/p right BKA.   Pulses:   2+ and symmetric all extremities  Skin:   Skin color, texture, turgor normal, no rashes or lesions  Lymph nodes:   Cervical, supraclavicular, and axillary nodes normal  Neurologic:   CNII-XII  intact, normal strength, sensation and reflexes    throughout    Activities of Daily Living In your present state of health, do you have any difficulty performing the following activities: 05/12/2018  Hearing? N  Vision? Y  Comment Has cataracts in both eyes.   Difficulty concentrating or making decisions? N  Walking or climbing stairs? Y  Comment Due to being in an electric wheelchair from a leg amputation.   Dressing or bathing? N  Doing errands, shopping? N  Preparing Food and eating ? N  Using the Toilet? N  In the past six months, have you accidently leaked urine? N  Do you have problems with loss of bowel control? N  Managing your Medications? N  Managing your Finances? N  Housekeeping or managing your Housekeeping? N  Some recent data might be hidden    Fall Risk Assessment Fall Risk  05/12/2018 04/17/2017 01/18/2016 01/18/2016 01/02/2015  Falls in the past year? 1 Yes No No No  Number falls in past yr: 0 1 - - -  Injury with Fall? 0 No - - -  Risk for fall due to : Impaired balance/gait - - - -  Follow up - Falls prevention discussed - - -     Depression Screen PHQ 2/9 Scores 05/12/2018 05/12/2018 04/17/2017 04/17/2017  PHQ - 2 Score 0 0 0 0  PHQ- 9 Score 8 - 5 -    6CIT Screen 05/12/2018 04/17/2017  What Year? 0 points 0 points  What month? 0 points 0 points  What time? 0 points 0 points  Count back from 20 0 points 0 points  Months in reverse 0 points 0 points  Repeat phrase 0 points 0 points  Total Score 0 0        Assessment & Plan:    Annual Physical Reviewed patient's Family Medical History Reviewed and  updated list of patient's medical providers Assessment of cognitive impairment was done Assessed patient's functional ability Established a written schedule for health screening [place] Completed and Reviewed  Exercise Activities and Dietary recommendations Goals    . Increase water intake     Recommend increasing water  intake to 6 glasses of water a day.     Marland Kitchen LIFESTYLE - DECREASE FALLS RISK     Recommend to remove any items from the home that may cause slips or trips.          Immunization History  Administered Date(s) Administered  . Influenza, High Dose Seasonal PF 03/23/2015, 04/11/2016, 04/17/2017  . Pneumococcal Conjugate-13 08/26/2013  . Pneumococcal Polysaccharide-23 06/13/2005  . Td 02/24/2008  . Tdap 03/26/2011  . Zoster 07/19/2010    Health Maintenance  Topic Date Due  . FOOT EXAM  07/12/1944  . DEXA SCAN  05/27/2018  . HEMOGLOBIN A1C  07/23/2018  . OPHTHALMOLOGY EXAM  01/01/2019  . TETANUS/TDAP  03/25/2021  . INFLUENZA VACCINE  Completed  . PNA vac Low Risk Adult  Completed     Discussed health benefits of physical activity, and encouraged her to engage in regular exercise appropriate for her age and condition.    ------------------------------------------------------------------------------------------------------------  1. Annual physical exam Counseled regarding shingrix she is due for. Is to check with pharmacy regarding coverage.   2. Type 2 diabetes mellitus with other diabetic kidney complication, without long-term current use of insulin (HCC)  - POCT glycosylated hemoglobin (Hb A1C)  3. Gastroesophageal reflux disease, esophagitis presence not specified Well controlled. Continue current medications.    4. Diarrhea, unspecified type Likely IBS. Try OTC Align. Consider xifaxin if not better in a month.   5. Strain of neck muscle, sequela Recommend alternate heat and ice.   6. Hyperlipidemia, unspecified hyperlipidemia type She is tolerating lovastatin well with no adverse effects.   - Comprehensive metabolic panel - Lipid panel  7. Hypothyroidism, unspecified type  - TSH  8. Hypomagnesemia She states she is not current on magnesium supplement.  - Magnesium  9. Vitamin D deficiency  - VITAMIN D 25 Hydroxy (Vit-D Deficiency, Fractures)  10.  Status post below knee amputation of right lower extremity ([place]) Doing well with prosthesis.    Lelon Huh, MD  [facility]

## 2018-05-12 NOTE — Patient Instructions (Addendum)
   The CDC recommends two doses of Shingrix (the shingles vaccine) separated by 2 to 6 months for adults age 81 years and older. I recommend checking with your insurance plan regarding coverage for this vaccine.    Try OTC Align for one month to see if it helps with diarrhea. If not better after a month, then call back for prescription medication.    Alternate applying heat and ice to neck 3-4 times a day.

## 2018-05-13 ENCOUNTER — Telehealth: Payer: Self-pay

## 2018-05-13 LAB — LIPID PANEL
Chol/HDL Ratio: 2.9 ratio (ref 0.0–4.4)
Cholesterol, Total: 165 mg/dL (ref 100–199)
HDL: 56 mg/dL (ref >39)
LDL Calculated: 79 mg/dL (ref 0–99)
Triglycerides: 151 mg/dL — ABNORMAL HIGH (ref 0–149)
VLDL Cholesterol Cal: 30 mg/dL (ref 5–40)

## 2018-05-13 LAB — MAGNESIUM: Magnesium: 1.6 mg/dL (ref 1.6–2.3)

## 2018-05-13 LAB — COMPREHENSIVE METABOLIC PANEL
ALT: 9 IU/L (ref 0–32)
AST: 13 IU/L (ref 0–40)
Albumin/Globulin Ratio: 2 (ref 1.2–2.2)
Albumin: 4.2 g/dL (ref 3.5–4.7)
Alkaline Phosphatase: 65 IU/L (ref 39–117)
BUN/Creatinine Ratio: 14 (ref 12–28)
BUN: 16 mg/dL (ref 8–27)
Bilirubin Total: 0.3 mg/dL (ref 0.0–1.2)
CO2: 23 mmol/L (ref 20–29)
Calcium: 9 mg/dL (ref 8.7–10.3)
Chloride: 102 mmol/L (ref 96–106)
Creatinine, Ser: 1.18 mg/dL — ABNORMAL HIGH (ref 0.57–1.00)
GFR calc Af Amer: 50 mL/min/{1.73_m2} — ABNORMAL LOW (ref >59)
GFR calc non Af Amer: 43 mL/min/{1.73_m2} — ABNORMAL LOW (ref >59)
Globulin, Total: 2.1 g/dL (ref 1.5–4.5)
Glucose: 78 mg/dL (ref 65–99)
Potassium: 4 mmol/L (ref 3.5–5.2)
Sodium: 140 mmol/L (ref 134–144)
Total Protein: 6.3 g/dL (ref 6.0–8.5)

## 2018-05-13 LAB — VITAMIN D 25 HYDROXY (VIT D DEFICIENCY, FRACTURES): Vit D, 25-Hydroxy: 33.8 ng/mL (ref 30.0–100.0)

## 2018-05-13 LAB — TSH: TSH: 1.24 u[IU]/mL (ref 0.450–4.500)

## 2018-05-13 NOTE — Telephone Encounter (Signed)
-----   Message from Malva Limes, MD sent at 05/13/2018  7:52 AM EST ----- Labs are good. Continue current medications.  Follow up in may as scheduled.

## 2018-05-13 NOTE — Telephone Encounter (Signed)
Pt advised.   Thanks,   -Chaysen Tillman  

## 2018-06-09 ENCOUNTER — Other Ambulatory Visit: Payer: Self-pay | Admitting: Family Medicine

## 2018-06-09 MED ORDER — TRAMADOL HCL 50 MG PO TABS: 50.0000 mg | ORAL_TABLET | Freq: Three times a day (TID) | ORAL | 3 refills | Status: DC | PRN

## 2018-06-09 NOTE — Telephone Encounter (Signed)
Pharmacy calling - needing a new Rx written for refills on: traMADol (ULTRAM) 50 MG tablet   [facility] [place] - [address] [phone] (Phone) [phone] (Fax)   Thanks, TGH

## 2018-06-29 ENCOUNTER — Other Ambulatory Visit: Payer: Self-pay | Admitting: Family Medicine

## 2018-07-22 ENCOUNTER — Telehealth: Payer: Self-pay

## 2018-07-22 NOTE — Telephone Encounter (Signed)
07/22/2018 Spoke with patient about community resources for in-home aide and financial assistance. MA

## 2018-07-23 ENCOUNTER — Ambulatory Visit: Payer: Self-pay | Admitting: Family Medicine

## 2018-07-23 DIAGNOSIS — Z89511 Acquired absence of right leg below knee: Secondary | ICD-10-CM | POA: Diagnosis not present

## 2018-07-23 DIAGNOSIS — J449 Chronic obstructive pulmonary disease, unspecified: Secondary | ICD-10-CM | POA: Diagnosis not present

## 2018-09-07 ENCOUNTER — Other Ambulatory Visit: Payer: Self-pay | Admitting: Family Medicine

## 2018-09-07 NOTE — Telephone Encounter (Signed)
[facility] faxed refill request for the following medications:  benzonatate (TESSALON) 100 MG capsule   Please advise.

## 2018-09-08 ENCOUNTER — Telehealth: Payer: Self-pay

## 2018-09-08 MED ORDER — BENZONATATE 100 MG PO CAPS: 100.0000 mg | ORAL_CAPSULE | Freq: Two times a day (BID) | ORAL | 0 refills | Status: DC | PRN

## 2018-09-08 NOTE — Telephone Encounter (Signed)
Rx filled.  If she is having new cough or concerned about infection, please have her come in for OV to evaluate.

## 2018-09-08 NOTE — Telephone Encounter (Signed)
Judeth Cornfield from [employer] called to check on the status of this refill request. I advised her that Dr. Sherrie Mustache is out of the office this week, and patient would probably need an office visit to receive a refill on cough medication from the covering providers. Judeth Cornfield verbalized understanding and states that she would reach out to the patient about scheduling an office visit. Could you review this refill request to see if a refill is appropriate? Thanks.

## 2018-09-08 NOTE — Telephone Encounter (Signed)
Patient advised. She states she does not have any new symptoms, but the cough is lingering. She states she uses the Tessalon at night to help her sleep. Advised patient if symptoms do not improve she will need to come back to be re-evaluated. She verbalized understanding.

## 2018-09-08 NOTE — Telephone Encounter (Signed)
Opened in error

## 2018-09-14 ENCOUNTER — Telehealth: Payer: Self-pay | Admitting: Family Medicine

## 2018-09-14 NOTE — Telephone Encounter (Signed)
Pt called concerned that she heard Metformin can cause cancer.  She has been taking it for five years  Callback [phone]  Thanks  teri

## 2018-09-14 NOTE — Telephone Encounter (Signed)
Please review

## 2018-09-14 NOTE — Telephone Encounter (Signed)
A contaminant called NMDA has been found in trace amounts in some lots of metformin. Those lots have been recalled. She should call her pharmacy and make sure she does not have one the recalled lots.

## 2018-09-15 NOTE — Telephone Encounter (Signed)
Pt advised.   Thanks,   -Laura  

## 2018-09-21 ENCOUNTER — Other Ambulatory Visit: Payer: Self-pay | Admitting: Family Medicine

## 2018-09-21 DIAGNOSIS — I10 Essential (primary) hypertension: Secondary | ICD-10-CM

## 2018-09-30 ENCOUNTER — Other Ambulatory Visit: Payer: Self-pay

## 2018-09-30 MED ORDER — BENZONATATE 100 MG PO CAPS: 100.0000 mg | ORAL_CAPSULE | Freq: Two times a day (BID) | ORAL | 2 refills | Status: DC | PRN

## 2018-11-10 ENCOUNTER — Ambulatory Visit: Payer: Self-pay | Admitting: Family Medicine

## 2018-11-21 ENCOUNTER — Other Ambulatory Visit: Payer: Self-pay | Admitting: Family Medicine

## 2018-12-26 ENCOUNTER — Other Ambulatory Visit: Payer: Self-pay | Admitting: Family Medicine

## 2018-12-28 ENCOUNTER — Other Ambulatory Visit: Payer: Self-pay | Admitting: Family Medicine

## 2019-01-02 ENCOUNTER — Other Ambulatory Visit: Payer: Self-pay | Admitting: Family Medicine

## 2019-02-01 ENCOUNTER — Telehealth: Payer: Self-pay | Admitting: Family Medicine

## 2019-02-01 NOTE — Telephone Encounter (Signed)
Pt called saying she has been having leg cramps and wants to know if Dr. Caryn Section can send [facility] something to the [facility]  CB#  [phone]  [place]

## 2019-02-02 NOTE — Telephone Encounter (Signed)
Pt advised.   Thanks,   -Arthella Headings  

## 2019-02-02 NOTE — Telephone Encounter (Signed)
Theres not any prescription medications that help with cramps.  OTC B complex vitamins usually help. A small glass of tonic water with quinine at bedtime usually helps, and increasing water consumption during the day usually helps.

## 2019-03-16 DIAGNOSIS — E119 Type 2 diabetes mellitus without complications: Secondary | ICD-10-CM | POA: Diagnosis not present

## 2019-03-16 LAB — HM DIABETES EYE EXAM

## 2019-04-08 ENCOUNTER — Encounter: Payer: Self-pay | Admitting: Family Medicine

## 2019-04-08 ENCOUNTER — Ambulatory Visit (INDEPENDENT_AMBULATORY_CARE_PROVIDER_SITE_OTHER): Payer: Medicare HMO | Admitting: Family Medicine

## 2019-04-08 ENCOUNTER — Other Ambulatory Visit: Payer: Self-pay

## 2019-04-08 DIAGNOSIS — J0100 Acute maxillary sinusitis, unspecified: Secondary | ICD-10-CM

## 2019-04-08 MED ORDER — AZITHROMYCIN 250 MG PO TABS: ORAL_TABLET | ORAL | 0 refills | Status: DC

## 2019-04-08 NOTE — Progress Notes (Signed)
 Kayla Boyle  MRN: [id number] DOB: 12/11/1934 Virtual Visit via Telephone Note  I connected with Kayla Boyle on 04/08/19 at  1:20 PM EDT by telephone and verified that I am speaking with the correct person using two identifiers.  Location: Patient: Home Provider: Office   I discussed the limitations, risks, security and privacy concerns of performing an evaluation and management service by telephone and the availability of in person appointments. I also discussed with the patient that there may be a patient responsible charge related to this service. The patient expressed understanding and agreed to proceed.  Subjective:  HPI   The patient is an 84 year old female who presents via phone visit for complaint of possible sinus infection.    Patient complains of tenderness on the left side of her face for a few months.  She went to the dentist and noticed that when he was pressing on that area it was very tender.  After getting home she notice she started to have swelling of her eye and redness going down her face on the left side.  Today she states that it is much worse.  She has been using tea bags on her eyes.  She does have congestion.    Patient Active Problem List   Diagnosis Date Noted  . Acquired trigger finger 05/28/2017  . Bursitis of shoulder 05/28/2017  . Pain, phantom limb (HCC) 10/17/2016  . Low back pain 10/24/2015  . Chronic cough 08/24/2015  . Allergic rhinitis 02/09/2015  . Arthritis of shoulder region, right 01/02/2015  . Arthritis 11/24/2014  . Chronic kidney disease (CKD), stage III (moderate) 11/24/2014  . Club foot 11/24/2014  . Diabetes mellitus with renal complications (HCC) 11/24/2014  . Eczema of hand 11/24/2014  . GERD (gastroesophageal reflux disease) 11/24/2014  . Arthralgia of hip 11/24/2014  . Hyperlipidemia 11/24/2014  . Hypertension 11/24/2014  . Hypomagnesemia 11/24/2014  . Hypothyroid 11/24/2014  . IBS (irritable bowel syndrome) 11/24/2014   . Mobility impaired 11/24/2014  . Restless leg 11/24/2014  . Restrictive lung disease 11/24/2014  . Status post below knee amputation 11/24/2014  . Scoliosis 11/24/2014  . Vitamin D deficiency 11/24/2014  . COPD, moderate (HCC) 12/16/2013  . OP (osteoporosis) 09/02/2011    Past Medical History:  Diagnosis Date  . Allergy   . Arthritis    osteo  . Diabetes mellitus without complication (HCC)    type 2  . H/O acute poliomyelitis 11/24/2014  . History of chicken pox   . Hyperlipidemia   . Hypertension   . Hypothyroidism   . Shingles    Past Surgical History:  Procedure Laterality Date  . ABDOMINAL HYSTERECTOMY  1963  . APPENDECTOMY    . CARPAL TUNNEL RELEASE    . CHOLECYSTECTOMY  10/2003   laproscopic  . ELBOW SURGERY Bilateral   . HAND SURGERY  01/2010  . LEG AMPUTATION BELOW KNEE Right 1975   traumatic; bleow knee and club foot   Family History  Problem Relation Age of Onset  . Pancreatic cancer Father   . Prostate cancer Brother   . Diabetes Brother        type 2  . Hepatitis C Daughter   . HIV/AIDS Son   . Cancer Son        LYMPH NODE CANCER  . Cancer Other 57       small cell cancer   Social History   Socioeconomic History  . Marital status: Single    Spouse name:   Not on file  . Number of children: 2  . Years of education: 12  . Highest education level: High school graduate  Occupational History  . Occupation: Disabled  . Occupation: retired  Social Needs  . Financial resource strain: Not hard at all  . Food insecurity    Worry: Never true    Inability: Never true  . Transportation needs    Medical: No    Non-medical: No  Tobacco Use  . Smoking status: Former Smoker    Types: Cigarettes    Quit date: 10/06/2001    Years since quitting: 17.5  . Smokeless tobacco: Never Used  Substance and Sexual Activity  . Alcohol use: No    Alcohol/week: 0.0 standard drinks  . Drug use: No  . Sexual activity: Not on file  Lifestyle  . Physical activity     Days per week: 0 days    Minutes per session: 0 min  . Stress: Not at all  Relationships  . Social connections    Talks on phone: Patient refused    Gets together: Patient refused    Attends religious service: Patient refused    Active member of club or organization: Patient refused    Attends meetings of clubs or organizations: Patient refused    Relationship status: Patient refused  . Intimate partner violence    Fear of current or ex partner: Patient refused    Emotionally abused: Patient refused    Physically abused: Patient refused    Forced sexual activity: Patient refused  Other Topics Concern  . Not on file  Social History Narrative  . Not on file    Outpatient Encounter Medications as of 04/08/2019  Medication Sig  . albuterol (VENTOLIN HFA) 108 (90 Base) MCG/ACT inhaler Inhale 2 puffs into the lungs every 6 (six) hours as needed for wheezing or shortness of breath.  . aspirin 81 MG tablet Take 1 tablet by mouth daily.  . benzonatate (TESSALON) 100 MG capsule Take 1 capsule (100 mg total) by mouth 2 (two) times daily as needed for cough.  . Blood Glucose Monitoring Suppl (ACCU-CHEK COMPACT CARE KIT) KIT As directed  . esomeprazole (NEXIUM) 40 MG capsule Take 1 capsule (40 mg total) by mouth daily. Do NOT crush or open capsule.  . furosemide (LASIX) 20 MG tablet Take 1 tablet (20 mg total) by mouth daily as needed (swelling).  . gabapentin (NEURONTIN) 100 MG capsule Take 1-2 capsules (100-200 mg total) by mouth 3 (three) times daily.  . glucose blood (ACCU-CHEK AVIVA PLUS) test strip Use to check blood sugar once a day  . levothyroxine (SYNTHROID) 75 MCG tablet TAKE 1 TABLET BY MOUTH EVERY DAY  . lisinopril (PRINIVIL,ZESTRIL) 5 MG tablet TAKE 1 TABLET BY MOUTH EVERY DAY  . lovastatin (MEVACOR) 40 MG tablet TAKE 1 TABLET BY MOUTH AT BEDTIME  . Magnesium Oxide 500 MG TABS Take 1 tablet by mouth daily.  . meclizine (ANTIVERT) 25 MG tablet Take 1 tablet (25 mg total) by  mouth 3 (three) times daily as needed for dizziness.  . metFORMIN (GLUCOPHAGE-XR) 500 MG 24 hr tablet TAKE 1 TABLET BY MOUTH DAILY  . methocarbamol (ROBAXIN) 500 MG tablet TAKE TWO TABLETS BY MOUTH EVERY 6 HOURS AS NEEDED FOR MUSCLE SPASMS  . montelukast (SINGULAIR) 10 MG tablet Take 1 tablet (10 mg total) by mouth daily.  . naproxen (NAPROSYN) 375 MG tablet Take 1 tablet (375 mg total) by mouth 2 (two) times daily with a meal.  .   RABEprazole (ACIPHEX) 20 MG tablet Take 1 tablet (20 mg total) by mouth daily.  . risedronate (ACTONEL) 35 MG tablet TAKE 1 TABLET BY MOUTH ONCE A WEEK. TAKEWITH A FULL GLASS OF WATER AND DO NOT LIE BACK DOWN FOR 30 MINUTES AFTER.  . traMADol (ULTRAM) 50 MG tablet TAKE 1-2 TABLETS (50-100MG TOTAL) BY MOUTH EVERY 8 HOURS AS NEEDED  . vitamin B-12 (CYANOCOBALAMIN) 100 MCG tablet Take 800 mcg by mouth daily.   . Vitamin D, Cholecalciferol, 400 UNITS CHEW Chew 1 tablet by mouth daily.   No facility-administered encounter medications on file as of 04/08/2019.     Allergies  Allergen Reactions  . Fosamax [Alendronate Sodium] Rash  . Bee Venom Hives  . Codeine Rash and Swelling  . Penicillins Rash and Swelling   Review of Systems  Constitutional: Negative for chills, diaphoresis, fever and malaise/fatigue.  HENT: Positive for congestion, ear pain, sinus pain and sore throat.   Respiratory: Negative for cough and shortness of breath.     Objective:  There were no vitals taken for this visit.  Physical Exam: No apparent respiratory distress during telephonic interview.  Assessment and Plan :   1. Acute maxillary sinusitis, recurrence not specified Has had some swelling and tenderness around the left eye with headache over the past 1-2 days. Some stuffiness in the left nostril and sent a faxed picture of puffiness around the left eye. Denies cough, fever, loss of taste/smell or cough. Using an OTC sinus medication to help with the headache. Will add a Z-pak and  should recheck if no better in 5 days. - azithromycin (ZITHROMAX) 250 MG tablet; Take 2 tablets by mouth the first day then one daily for 4 days.  Dispense: 6 tablet; Refill: 0  Follow Up Instructions:  I discussed the assessment and treatment plan with the patient. The patient was provided an opportunity to ask questions and all were answered. The patient agreed with the plan and demonstrated an understanding of the instructions.   The patient was advised to call back or seek an in-person evaluation if the symptoms worsen or if the condition fails to improve as anticipated.  I provided 15 minutes of non-face-to-face time during this encounter.   Dennis Chrismon, PA  

## 2019-04-12 ENCOUNTER — Telehealth: Payer: Self-pay | Admitting: Family Medicine

## 2019-04-12 NOTE — Telephone Encounter (Signed)
Pt took the azithromycin (ZITHROMAX) 250 MG tablet Chrismon had prescribed. Pt still has puffiness from under nose, under eye and eyelid.  Wanting to know if she needs to prescribed another round of azithromycin (ZITHROMAX) 250 MG tablet or what she needs to do.  Please call pt back and advise.  Thanks, [employer]

## 2019-04-12 NOTE — Telephone Encounter (Signed)
This antibiotic is a 5 day pill pack that stays in the blood stream for 10 days. It should still be in working for 5 more days. Should allow someone to see this in person. May need x-ray evaluation.

## 2019-04-12 NOTE — Telephone Encounter (Signed)
Please advise 

## 2019-04-13 NOTE — Telephone Encounter (Signed)
Patient was advised. Patient stated that she will wait until her follow up appt with Dr. Caryn Section 05/11/2019 to be seen, due difficulty with transpiration.

## 2019-04-22 DIAGNOSIS — J449 Chronic obstructive pulmonary disease, unspecified: Secondary | ICD-10-CM | POA: Diagnosis not present

## 2019-04-22 DIAGNOSIS — J320 Chronic maxillary sinusitis: Secondary | ICD-10-CM | POA: Diagnosis not present

## 2019-05-04 DIAGNOSIS — R69 Illness, unspecified: Secondary | ICD-10-CM | POA: Diagnosis not present

## 2019-05-10 ENCOUNTER — Telehealth: Payer: Self-pay

## 2019-05-10 NOTE — Telephone Encounter (Signed)
I called patient to do a prescreen for her 6 month follow up visit on tomorrow. Patient says she has been having issues with her eyes matting all day long. She says this has been ongoing problem for several months and she thought it was related to her allergies/ sinuses. Patient says she went to her Cardiologist and was prescribed an antibiotic, but the antibiotic didn't help. Patient was planning to discuss this problem with you tomorrow during her appointment. She also mentioned that she has been having diarrhea due to colitis which she wants to have evaluated. Please advise on whether patient needs to reschedule follow up appointment, or change to virtual visit. She uses transportation services and needs to know asap so that she can let her ride know. The transportation service closes at 4pm today.

## 2019-05-10 NOTE — Telephone Encounter (Signed)
Pt called back to see if she was going to be able to be seen or not. After speaking with Roshena and Dr. Caryn Section I tried to explain to the pt that we can't see her in the office but we could change it to a telephone visit. Pt started yelling saying for me to tell Dr. Caryn Section he needs to change her Metformin and yelled that she has a doctor so she can come in the office so he can look at her eyes. I again tried to explain to the pt that she could discuss this with Dr. Caryn Section tomorrow over the phone but pt yelled "forget it" and hung up. I don't know if I should cancel the appt or change it to a telephone visit. Please advise. Thanks TNP

## 2019-05-11 ENCOUNTER — Other Ambulatory Visit: Payer: Self-pay

## 2019-05-11 ENCOUNTER — Ambulatory Visit (INDEPENDENT_AMBULATORY_CARE_PROVIDER_SITE_OTHER): Payer: Medicare HMO | Admitting: Family Medicine

## 2019-05-11 DIAGNOSIS — E1129 Type 2 diabetes mellitus with other diabetic kidney complication: Secondary | ICD-10-CM

## 2019-05-11 DIAGNOSIS — K580 Irritable bowel syndrome with diarrhea: Secondary | ICD-10-CM | POA: Diagnosis not present

## 2019-05-11 DIAGNOSIS — K219 Gastro-esophageal reflux disease without esophagitis: Secondary | ICD-10-CM

## 2019-05-11 DIAGNOSIS — J301 Allergic rhinitis due to pollen: Secondary | ICD-10-CM

## 2019-05-11 DIAGNOSIS — H1013 Acute atopic conjunctivitis, bilateral: Secondary | ICD-10-CM | POA: Diagnosis not present

## 2019-05-11 MED ORDER — DICYCLOMINE HCL 10 MG PO CAPS: 10.0000 mg | ORAL_CAPSULE | Freq: Three times a day (TID) | ORAL | 3 refills | Status: DC

## 2019-05-11 MED ORDER — METFORMIN HCL 500 MG PO TABS: 500.0000 mg | ORAL_TABLET | Freq: Every day | ORAL | 3 refills | Status: DC

## 2019-05-11 MED ORDER — DEXILANT 60 MG PO CPDR: 60.0000 mg | DELAYED_RELEASE_CAPSULE | Freq: Every day | ORAL | 3 refills | Status: DC

## 2019-05-11 MED ORDER — MONTELUKAST SODIUM 10 MG PO TABS: 10.0000 mg | ORAL_TABLET | Freq: Every day | ORAL | 4 refills | Status: DC

## 2019-05-11 NOTE — Patient Instructions (Signed)
.   Please review the attached list of medications and notify my office if there are any errors.   . Please bring all of your medications to every appointment so we can make sure that our medication list is the same as yours.   . It is especially important to get the annual flu vaccine this year. If you haven't had it already, please go to your pharmacy or call the office as soon as possible to schedule you flu shot.  

## 2019-05-11 NOTE — Progress Notes (Signed)
Patient: Kayla Boyle Female    DOB: 23-Apr-1935   84 y.o.   MRN: [id number] Visit Date: 05/11/2019  Today's Provider: Lelon Huh, MD   Chief Complaint  Patient presents with  . Sinus Problem   Subjective:    Virtual Visit via Telephone Note  I connected with Kayla Boyle on 05/11/19 at  8:20 AM EST by telephone and verified that I am speaking with the correct person using two identifiers.  Location: Patient: home   Provider: bfp   I discussed the limitations, risks, security and privacy concerns of performing an evaluation and management service by telephone and the availability of in person appointments. I also discussed with the patient that there may be a patient responsible charge related to this service. The patient expressed understanding and agreed to proceed.     Sinus Problem This is a recurrent problem. The current episode started more than 1 month ago. The problem is unchanged. There has been no fever. Associated symptoms include shortness of breath (unchanged from baseline). Pertinent negatives include no chills. (Matted eyes) Treatments tried: oral antibiotics. The treatment provided no relief.  Was prescribed azithromycin by Vernie Murders on 04/08/2019  States stomach is still messed up from colitis. Has water stool every time she eats, has had all of her life.  Eyes still red itchy, and watery. Is no longer on any prescription allergy medications.   She also states she received letter that her metformin has been recalled and needs to change prescriptions.    Allergies  Allergen Reactions  . Fosamax [Alendronate Sodium] Rash  . Bee Venom Hives  . Codeine Rash and Swelling  . Penicillins Rash and Swelling     Current Outpatient Medications:  .  aspirin 81 MG tablet, Take 1 tablet by mouth daily., Disp: , Rfl:  .  esomeprazole (NEXIUM) 40 MG capsule, Take 1 capsule (40 mg total) by mouth daily. Do NOT crush or open capsule., Disp: 90 capsule,  Rfl: 4 .  furosemide (LASIX) 20 MG tablet, Take 1 tablet (20 mg total) by mouth daily as needed (swelling)., Disp: 90 tablet, Rfl: 3 .  gabapentin (NEURONTIN) 100 MG capsule, Take 1-2 capsules (100-200 mg total) by mouth 3 (three) times daily., Disp: 90 capsule, Rfl: 3 .  levothyroxine (SYNTHROID) 75 MCG tablet, TAKE 1 TABLET BY MOUTH EVERY DAY, Disp: 180 tablet, Rfl: 4 .  lisinopril (PRINIVIL,ZESTRIL) 5 MG tablet, TAKE 1 TABLET BY MOUTH EVERY DAY, Disp: 90 tablet, Rfl: 4 .  lovastatin (MEVACOR) 40 MG tablet, TAKE 1 TABLET BY MOUTH AT BEDTIME, Disp: 90 tablet, Rfl: 4 .  metFORMIN (GLUCOPHAGE-XR) 500 MG 24 hr tablet, TAKE 1 TABLET BY MOUTH DAILY, Disp: 90 tablet, Rfl: 4 .  methocarbamol (ROBAXIN) 500 MG tablet, TAKE TWO TABLETS BY MOUTH EVERY 6 HOURS AS NEEDED FOR MUSCLE SPASMS, Disp: 60 tablet, Rfl: 3 .  naproxen (NAPROSYN) 375 MG tablet, Take 1 tablet (375 mg total) by mouth 2 (two) times daily with a meal., Disp: 30 tablet, Rfl: 0 .  risedronate (ACTONEL) 35 MG tablet, TAKE 1 TABLET BY MOUTH ONCE A WEEK. TAKEWITH A FULL GLASS OF WATER AND DO NOT LIE BACK DOWN FOR 30 MINUTES AFTER., Disp: 12 tablet, Rfl: 4 .  traMADol (ULTRAM) 50 MG tablet, TAKE 1-2 TABLETS (50-100MG TOTAL) BY MOUTH EVERY 8 HOURS AS NEEDED, Disp: 90 tablet, Rfl: 3 .  albuterol (VENTOLIN HFA) 108 (90 Base) MCG/ACT inhaler, Inhale 2 puffs into the lungs every  6 (six) hours as needed for wheezing or shortness of breath., Disp: 54 g, Rfl: 1 .  benzonatate (TESSALON) 100 MG capsule, Take 1 capsule (100 mg total) by mouth 2 (two) times daily as needed for cough., Disp: 20 capsule, Rfl: 2 .  Blood Glucose Monitoring Suppl (ACCU-CHEK COMPACT CARE KIT) KIT, As directed, Disp: 1 each, Rfl: 0 .  glucose blood (ACCU-CHEK AVIVA PLUS) test strip, Use to check blood sugar once a day, Disp: 100 each, Rfl: 4 .  Magnesium Oxide 500 MG TABS, Take 1 tablet by mouth daily., Disp: , Rfl:  .  meclizine (ANTIVERT) 25 MG tablet, Take 1 tablet (25 mg total)  by mouth 3 (three) times daily as needed for dizziness. (Patient not taking: Reported on 05/11/2019), Disp: 30 tablet, Rfl: 3 .  montelukast (SINGULAIR) 10 MG tablet, Take 1 tablet (10 mg total) by mouth daily. (Patient not taking: Reported on 05/11/2019), Disp: 90 tablet, Rfl: 4 .  vitamin B-12 (CYANOCOBALAMIN) 100 MCG tablet, Take 800 mcg by mouth daily. , Disp: , Rfl:  .  Vitamin D, Cholecalciferol, 400 UNITS CHEW, Chew 1 tablet by mouth daily., Disp: , Rfl:   Review of Systems  Constitutional: Negative for appetite change, chills, fatigue and fever.  HENT: Positive for postnasal drip.   Eyes: Positive for discharge.  Respiratory: Positive for shortness of breath (unchanged from baseline). Negative for chest tightness.   Cardiovascular: Negative for chest pain and palpitations.  Gastrointestinal: Positive for diarrhea. Negative for abdominal pain, nausea and vomiting.  Neurological: Negative for dizziness and weakness.    Social History   Tobacco Use  . Smoking status: Former Smoker    Types: Cigarettes    Quit date: 10/06/2001    Years since quitting: 17.6  . Smokeless tobacco: Never Used  Substance Use Topics  . Alcohol use: No    Alcohol/week: 0.0 standard drinks      Objective:   There were no vitals taken for this visit.   Physical Exam  Awake, alert and oriented x 3    Assessment & Plan     1. Seasonal allergic rhinitis due to pollen She is to start back on OTC loratadine and montelukast as below.   2. Allergic conjunctivitis of both eyes Recommended allergy eye drops, but she wants to try getting back on oral medications first.  - montelukast (SINGULAIR) 10 MG tablet; Take 1 tablet (10 mg total) by mouth daily.  Dispense: 90 tablet; Refill: 4  3. Irritable bowel syndrome with diarrhea Having loose watery stools after eating, which has been a problem all of her life, but getting worse. try- dicyclomine (BENTYL) 10 MG capsule; Take 1 capsule (10 mg total) by mouth  4 (four) times daily -  before meals and at bedtime. For colitis  Dispense: 90 capsule; Refill: 3  4. Type 2 diabetes mellitus with other diabetic kidney complication, without long-term current use of insulin (HCC) Change from ER metformin to IR- metFORMIN (GLUCOPHAGE) 500 MG tablet; Take 1 tablet (500 mg total) by mouth daily with supper.  Dispense: 90 tablet; Refill: 3 due to recall.   5. Gastroesophageal reflux disease, unspecified whether esophagitis present She states she continues to have daily heartburn despite taking esomeprazole 40 consistently every day. Change to - dexlansoprazole (DEXILANT) 60 MG capsule; Take 1 capsule (60 mg total) by mouth daily. Take in place of Nexium  Dispense: 90 capsule; Refill: 3    I discussed the assessment and treatment plan with the patient. The patient  was provided an opportunity to ask questions and all were answered. The patient agreed with the plan and demonstrated an understanding of the instructions.   The patient was advised to call back or seek an in-person evaluation if the symptoms worsen or if the condition fails to improve as anticipated.  I provided 15 minutes of non-face-to-face time during this encounter.  Will contact patient next week to try to reschedule in-person follow up visit for exam and labs.      Lelon Huh, MD  [facility]

## 2019-05-18 ENCOUNTER — Encounter: Payer: Self-pay | Admitting: Family Medicine

## 2019-05-20 ENCOUNTER — Ambulatory Visit: Payer: Medicare HMO

## 2019-05-20 ENCOUNTER — Encounter: Payer: Medicare HMO | Admitting: Family Medicine

## 2019-05-21 ENCOUNTER — Other Ambulatory Visit: Payer: Self-pay | Admitting: Family Medicine

## 2019-05-21 DIAGNOSIS — R197 Diarrhea, unspecified: Secondary | ICD-10-CM

## 2019-05-21 DIAGNOSIS — H1089 Other conjunctivitis: Secondary | ICD-10-CM

## 2019-05-21 NOTE — Telephone Encounter (Signed)
Pt letting Dr. Caryn Section know:  Still having diarrhea after taking the Rx prescribed Still having left eye infection - watery  Please call pt back at [phone] to advise.  Thanks, [employer]

## 2019-05-24 MED ORDER — CIPROFLOXACIN HCL 0.3 % OP SOLN: 1.0000 [drp] | OPHTHALMIC | 0 refills | Status: DC

## 2019-05-24 NOTE — Telephone Encounter (Signed)
Patient advised. RX sent. Lab slip printed at the front desk

## 2019-05-24 NOTE — Telephone Encounter (Signed)
She needs stools studies for diarrhea.  Start prescription ciloxan for eye. Please print lab order and sent prescription for ciloxan

## 2019-07-12 NOTE — Telephone Encounter (Signed)
Error

## 2019-07-13 DIAGNOSIS — H04222 Epiphora due to insufficient drainage, left lacrimal gland: Secondary | ICD-10-CM | POA: Diagnosis not present

## 2019-07-20 ENCOUNTER — Telehealth: Payer: Self-pay | Admitting: Family Medicine

## 2019-07-20 ENCOUNTER — Other Ambulatory Visit: Payer: Self-pay | Admitting: Family Medicine

## 2019-07-20 MED ORDER — TRAMADOL HCL 50 MG PO TABS: 50.0000 mg | ORAL_TABLET | Freq: Three times a day (TID) | ORAL | 4 refills | Status: DC | PRN

## 2019-07-20 MED ORDER — GABAPENTIN 300 MG PO CAPS: 300.0000 mg | ORAL_CAPSULE | Freq: Three times a day (TID) | ORAL | 3 refills | Status: DC

## 2019-07-20 NOTE — Telephone Encounter (Signed)
Pt is calling and she is in pain. Pt lost her right leg 40 years ago and is having phantom pain. Pt is unable to sleep due to severe pain.[facility]

## 2019-07-20 NOTE — Telephone Encounter (Signed)
We've prescribed gabapentin for her for this before. Is she still taking it?

## 2019-07-20 NOTE — Telephone Encounter (Signed)
Patient advised.

## 2019-07-20 NOTE — Telephone Encounter (Addendum)
Patient states she is taking Gabapentin 100 mg 1 tab TID. She states that is not helping. She states the pain is "pins and needles" She is also requesting a refill on Tramadol. Refill was sent today.

## 2019-07-20 NOTE — Telephone Encounter (Signed)
Need to increase gabapentin to 300mg , have sent prescription for 300mg  tabs and tramadol to her pharmacy

## 2019-07-26 ENCOUNTER — Telehealth: Payer: Self-pay

## 2019-07-26 NOTE — Telephone Encounter (Signed)
error 

## 2019-07-26 NOTE — Telephone Encounter (Signed)
Called and spoke with patient. She stated that she does not have an Original DNR as it is listed in her Living will not to be resuscitated. A DNR form has been given to Dr.Fisher to fill out. The patient would like the form mailed to her if possible and to be called if that can not happen.

## 2019-07-26 NOTE — Telephone Encounter (Signed)
Copied from CRM [id number]. Topic: General - Other >> Jul 26, 2019  8:30 AM Jaquita Rector A wrote: Reason for CRM: Patient called to request a copy of her DNR form to put on her refrigerator at home so that if ever the paramedics come to her home its there for them to see and follow. Any questions please call Ph# [phone]

## 2019-07-30 DIAGNOSIS — H2513 Age-related nuclear cataract, bilateral: Secondary | ICD-10-CM | POA: Diagnosis not present

## 2019-08-25 ENCOUNTER — Telehealth: Payer: Self-pay

## 2019-08-25 DIAGNOSIS — Z1211 Encounter for screening for malignant neoplasm of colon: Secondary | ICD-10-CM

## 2019-08-25 MED ORDER — METRONIDAZOLE 500 MG PO TABS: 500.0000 mg | ORAL_TABLET | Freq: Two times a day (BID) | ORAL | 0 refills | Status: AC

## 2019-08-25 NOTE — Telephone Encounter (Signed)
We ordered stool tests in November, but we the lab never stool sample and never heard back from her since then.   She can try a course of metronidazole, but all we can do is guess what's going on without a stool sample. If she's not able to do test can send in prescription for metronidazole 500mg  BID for 10 days.   Have printed a new order for Cologuard. Please fax to [facility].

## 2019-08-25 NOTE — Telephone Encounter (Signed)
Called pt to schedule a telephonic AWV and pt states right now she is concerned because the [facility] is not doing its job. Pt expressed how displeased she was with her care from [facility] since Covid started. Advised pt that our protocols have changed with in office visits. I apologized for any previous inconvience and for upsetting her.  Pt states she cannot schedule an apt with PCP and if so it gets cancelled. Pt states that when she calls in the office whomever she speaks with has a bad attitude. Pt also states she has not received her cologuard kit that was supposed to be mailed to her months ago.  Pt states she is still having diarrhea with everything she eats. Pt has switched to a gluten free diet and she has not seen any changes. Pt is unable to leave the home due to diarrhea. Declines nausea or vomiting. Pt has strong abdominal pains prior to diarrhea. Pt states this concern has been brought up previously but it is never fully addressed. Pt would like to know if there is another medication she can take to help with her colitis and diarrhea.   Scheduled a telephonic AWV for 08/31/19 @ 9:40 AM.   Please advise, thank you.

## 2019-08-25 NOTE — Telephone Encounter (Signed)
Patient advised as below. She states she is not able to come to our office to pick up a kit to complete the stool test. She has to arrange for transportation services 2 weeks in advance. Patient would like a prescription for Metronidazole to be sent in to her pharmacy. I e prescribed prescription after getting off the phone with patient. I also advised her that we would be faxing another Cologuard order to [facility].   Cologuard order placed on Dr. Fisher's desk to be signed. I will fax order after it is signed.  

## 2019-08-30 DIAGNOSIS — R197 Diarrhea, unspecified: Secondary | ICD-10-CM | POA: Diagnosis not present

## 2019-08-30 NOTE — Progress Notes (Signed)
Subjective:   Kayla Boyle is a 85 y.o. female who presents for Medicare Annual (Subsequent) preventive examination.    This visit is being conducted through telemedicine due to the COVID-19 pandemic. This patient has given me verbal consent via doximity to conduct this visit, patient states they are participating from their home address. Some vital signs may be absent or patient reported.    Patient identification: identified by name, DOB, and current address  Review of Systems:  N/A  Cardiac Risk Factors include: advanced age (>8mn, >>69women);diabetes mellitus;dyslipidemia;hypertension;obesity (BMI >30kg/m2)     Objective:     Vitals: There were no vitals taken for this visit.  There is no height or weight on file to calculate BMI. Unable to obtain vitals due to visit being conducted via telephonically.   Advanced Directives 08/31/2019 05/12/2018 04/17/2017  Does Patient Have a Medical Advance Directive? Yes Yes Yes  Type of AParamed[place]of [place] will Living will;Healthcare Power of [place]  Copy of [place] Chart? No - copy requested No - copy requested No - copy requested    Tobacco Social History   Tobacco Use  Smoking Status Former Smoker  . Types: Cigarettes  . Quit date: 10/06/2001  . Years since quitting: 17.9  Smokeless Tobacco Never Used     Counseling given: Not Answered   Clinical Intake:  Pre-visit preparation completed: Yes  Pain : 0-10 Pain Score: 10-Worst pain ever Pain Type: Chronic pain Pain Location: Back Pain Orientation: Lower Pain Descriptors / Indicators: Aching Pain Frequency: Constant     Nutritional Risks: Nausea/ vomitting/ diarrhea(Pt has diarrhea after eating. Pt is currently on treatment for this.) Diabetes: Yes  How often do you need to have someone help you when you read instructions, pamphlets, or other written materials from  your doctor or pharmacy?: 1 - Never   Diabetes:  Is the patient diabetic?  Yes  If diabetic, was a CBG obtained today?  No  Did the patient bring in their glucometer from home?  No  How often do you monitor your CBG's? Once or twice a week.   Financial Strains and Diabetes Management:  Are you having any financial strains with the device, your supplies or your medication? No .  Does the patient want to be seen by Chronic Care Management for management of their diabetes?  No  Would the patient like to be referred to a Nutritionist or for Diabetic Management?  No   Diabetic Exams:  Diabetic Eye Exam: Completed 03/16/19. Repeat yearly.  Diabetic Foot Exam: Currently due. Pt has been advised about the importance in completing this exam. Note made to follow up on this at next in office apt.    Interpreter Needed?: No  Information [facility] :: [facility]  Past Medical History:  Diagnosis Date  . Allergy   . Arthritis    osteo  . [facility]    type 2  . H/O acute poliomyelitis 11/24/2014  . History of chicken pox   . Hyperlipidemia   . Hypertension   . Hypothyroidism   . Shingles    Past Surgical History:  Procedure Laterality Date  . ABDOMINAL HYSTERECTOMY  1963  . APPENDECTOMY    . CARPAL TUNNEL RELEASE    . CHOLECYSTECTOMY  10/2003   laproscopic  . ELBOW SURGERY Bilateral   . HAND SURGERY  01/2010  . LEG AMPUTATION BELOW KNEE Right 1975  traumatic; bleow knee and club foot   Family History  Problem Relation Age of Onset  . Pancreatic cancer Father   . Prostate cancer Brother   . Diabetes Brother        type 2  . Hepatitis C Daughter   . HIV/AIDS Son   . Cancer Son        LYMPH NODE CANCER  . Cancer Other 57       small cell cancer  . Glaucoma Sister   . Cancer Sister        kidney removed   Social History   Socioeconomic History  . Marital status: Widowed    Spouse name: Not on file  . Number of children: 2  .  Years of education: 41  . Highest education level: High school graduate  Occupational History  . Occupation: Disabled  . Occupation: retired  Tobacco Use  . Smoking status: Former Smoker    Types: Cigarettes    Quit date: 10/06/2001    Years since quitting: 17.9  . Smokeless tobacco: Never Used  Substance and Sexual Activity  . Alcohol use: No    Alcohol/week: 0.0 standard drinks  . Drug use: No  . Sexual activity: Not on file  Other Topics Concern  . Not on file  Social History Narrative  . Not on file   Social Determinants of Health   Financial Resource Strain: Low Risk   . Difficulty of Paying Living Expenses: Not very hard  Food Insecurity: No Food Insecurity  . Worried About Charity fundraiser in the Last Year: Never true  . Ran Out of Food in the Last Year: Never true  Transportation Needs: No Transportation Needs  . Lack of Transportation (Medical): No  . Lack of Transportation (Non-Medical): No  Physical Activity: Inactive  . Days of Exercise per Week: 0 days  . Minutes of Exercise per Session: 0 min  Stress: Stress Concern Present  . Feeling of Stress : Very much  Social Connections: Somewhat Isolated  . Frequency of Communication with Friends and Family: More than three times a week  . Frequency of Social Gatherings with Friends and Family: More than three times a week  . Attends Religious Services: More than 4 times per year  . Active Member of Clubs or Organizations: No  . Attends Archivist Meetings: Never  . Marital Status: Widowed    Outpatient Encounter Medications as of 08/31/2019  Medication Sig  . albuterol (VENTOLIN HFA) 108 (90 Base) MCG/ACT inhaler Inhale 2 puffs into the lungs every 6 (six) hours as needed for wheezing or shortness of breath.  Marland Kitchen aspirin 81 MG tablet Take 1 tablet by mouth daily.  . benzonatate (TESSALON) 100 MG capsule Take 1 capsule (100 mg total) by mouth 2 (two) times daily as needed for cough.  . Blood Glucose  Monitoring Suppl (ACCU-CHEK COMPACT CARE KIT) KIT As directed  . dexlansoprazole (DEXILANT) 60 MG capsule Take 1 capsule (60 mg total) by mouth daily. Take in place of Nexium  . dicyclomine (BENTYL) 10 MG capsule Take 1 capsule (10 mg total) by mouth 4 (four) times daily -  before meals and at bedtime. For colitis  . furosemide (LASIX) 20 MG tablet Take 1 tablet (20 mg total) by mouth daily as needed (swelling).  . gabapentin (NEURONTIN) 300 MG capsule Take 1 capsule (300 mg total) by mouth 3 (three) times daily.  Marland Kitchen glucose blood (ACCU-CHEK AVIVA PLUS) test strip Use to check blood  sugar once a day  . levothyroxine (SYNTHROID) 75 MCG tablet TAKE 1 TABLET BY MOUTH EVERY DAY  . lisinopril (PRINIVIL,ZESTRIL) 5 MG tablet TAKE 1 TABLET BY MOUTH EVERY DAY  . lovastatin (MEVACOR) 40 MG tablet TAKE 1 TABLET BY MOUTH AT BEDTIME  . methocarbamol (ROBAXIN) 500 MG tablet TAKE TWO TABLETS BY MOUTH EVERY 6 HOURS AS NEEDED FOR MUSCLE SPASMS  . montelukast (SINGULAIR) 10 MG tablet Take 1 tablet (10 mg total) by mouth daily.  . risedronate (ACTONEL) 35 MG tablet TAKE 1 TABLET BY MOUTH ONCE A WEEK. TAKEWITH A FULL GLASS OF WATER AND DO NOT LIE BACK DOWN FOR 30 MINUTES AFTER.  . traMADol (ULTRAM) 50 MG tablet Take 1-2 tablets (50-100 mg total) by mouth every 8 (eight) hours as needed.  . vitamin B-12 (CYANOCOBALAMIN) 100 MCG tablet Take 800 mcg by mouth daily.   . Vitamin D, Cholecalciferol, 400 UNITS CHEW Chew 1 tablet by mouth daily.  Marland Kitchen esomeprazole (NEXIUM) 40 MG capsule Take 1 capsule (40 mg total) by mouth daily. Do NOT crush or open capsule. (Patient not taking: Reported on 08/31/2019)  . Magnesium Oxide 500 MG TABS Take 1 tablet by mouth daily.  . metFORMIN (GLUCOPHAGE) 500 MG tablet Take 1 tablet (500 mg total) by mouth daily with supper. (Patient not taking: Reported on 08/31/2019)  . metroNIDAZOLE (FLAGYL) 500 MG tablet Take 1 tablet (500 mg total) by mouth 2 (two) times daily for 10 days. (Patient not  taking: Reported on 08/31/2019)  . naproxen (NAPROSYN) 375 MG tablet Take 1 tablet (375 mg total) by mouth 2 (two) times daily with a meal. (Patient not taking: Reported on 08/31/2019)   No facility-administered encounter medications on file as of 08/31/2019.    Activities of Daily Living In your present state of health, do you have any difficulty performing the following activities: 08/31/2019  Hearing? N  Vision? N  Difficulty concentrating or making decisions? N  Walking or climbing stairs? Y  Comment Due to back pain and amputation.  Dressing or bathing? N  Doing errands, shopping? Y  Comment Does not drive.  Preparing Food and eating ? N  Using the Toilet? N  In the past six months, have you accidently leaked urine? Y  Comment Due to the bladder lift dropping.  Do you have problems with loss of bowel control? Y  Comment Due to ongoing diarrhea.  Managing your Medications? N  Managing your Finances? N  Housekeeping or managing your Housekeeping? N  Some recent data might be hidden    Patient Care Team: Birdie Sons, MD as PCP - General (Family Medicine) Erby Pian, MD as Referring Physician (Pulmonary Disease) Eulogio Bear, MD as Consulting Physician (Ophthalmology)    Assessment:   This is a routine wellness examination for [facility].  Exercise Activities and Dietary recommendations Current Exercise Habits: The patient does not participate in regular exercise at present, Exercise limited by: orthopedic condition(s)  Goals    . Increase water intake     Recommend increasing water intake to 6 glasses of water a day.        Fall Risk: Fall Risk  08/31/2019 05/12/2018 04/17/2017 01/18/2016 01/18/2016  Falls in the past year? 0 1 Yes No No  Number falls in past yr: 0 0 1 - -  Injury with Fall? 0 0 No - -  Risk for fall due to : - Impaired balance/gait - - -  Follow up - - Falls prevention discussed - -  FALL RISK PREVENTION PERTAINING TO THE HOME:  Any  stairs in or around the home? No  If so, are there any without handrails? N/A  Home free of loose throw rugs in walkways, pet beds, electrical cords, etc? Yes  Adequate lighting in your home to reduce risk of falls? Yes   ASSISTIVE DEVICES UTILIZED TO PREVENT FALLS:  Life alert? No  Use of a cane, walker or w/c? Yes  Grab bars in the bathroom? Yes  Shower chair or bench in shower? Yes  Elevated toilet seat or a handicapped toilet? No   TIMED UP AND GO:  Was the test performed? No .    Depression Screen PHQ 2/9 Scores 08/31/2019 05/12/2018 05/12/2018 04/17/2017  PHQ - 2 Score 0 0 0 0  PHQ- 9 Score - 8 - 5     Cognitive Function: Declined today.     6CIT Screen 05/12/2018 04/17/2017  What Year? 0 points 0 points  What month? 0 points 0 points  What time? 0 points 0 points  Count back from 20 0 points 0 points  Months in reverse 0 points 0 points  Repeat phrase 0 points 0 points  Total Score 0 0    Immunization History  Administered Date(s) Administered  . Fluad Quad(high Dose 65+) 04/13/2019  . Influenza, High Dose Seasonal PF 03/23/2015, 04/11/2016, 04/17/2017, 05/12/2018  . Pneumococcal Conjugate-13 08/26/2013  . Pneumococcal Polysaccharide-23 06/13/2005  . Td 02/24/2008  . Tdap 03/26/2011  . Zoster 07/19/2010    Qualifies for Shingles Vaccine? Yes  Zostavax completed 07/19/10. Due for Shingrix. Pt has been advised to call insurance company to determine out of pocket expense. Advised may also receive vaccine at local pharmacy or Health Dept. Verbalized acceptance and understanding.  Tdap: Up to date  Flu Vaccine: Up to date  Pneumococcal Vaccine: Completed series  Screening Tests Health Maintenance  Topic Date Due  . FOOT EXAM  07/12/1944  . DEXA SCAN  05/27/2018  . HEMOGLOBIN A1C  11/10/2018  . OPHTHALMOLOGY EXAM  03/15/2020  . TETANUS/TDAP  03/25/2021  . INFLUENZA VACCINE  Completed  . PNA vac Low Risk Adult  Completed    Cancer  Screenings:  Colorectal Screening: No longer required.   Mammogram: No longer required.   Bone Density: Completed 05/27/17. Results reflect OSTEOPOROSIS. Repeat every year. Pt declined order today.  Lung Cancer Screening: (Low Dose CT Chest recommended if Age 35-80 years, 30 pack-year currently smoking OR have quit w/in 15years.) does not qualify.   Additional Screening:  Dental Screening: Recommended annual dental exams for proper oral hygiene   Community Resource Referral:  CRR required this visit?  No , pt declined referral to CCM or C3 for assistance.     Plan:  I have personally reviewed and addressed the Medicare Annual Wellness questionnaire and have noted the following in the patient's chart:  A. Medical and social history B. Use of alcohol, tobacco or illicit drugs  C. Current medications and supplements D. Functional ability and status E.  Nutritional status F.  Physical activity G. Advance directives H. List of other physicians I.  Hospitalizations, surgeries, and ER visits in previous 12 months J.  [place] such as hearing and vision if needed, cognitive and depression L. Referrals and appointments   In addition, I have reviewed and discussed with patient certain preventive protocols, quality metrics, and best practice recommendations. A written personalized care plan for preventive services as well as general preventive health recommendations were provided to patient.  Glendora Score, [place]  09/12/6576 Nurse Health Advisor   Nurse Notes: Pt needs a diabetic foot exam and Hgb A1c check at next in office visit. Pt declined a DEXA order at this time.

## 2019-08-31 ENCOUNTER — Telehealth: Payer: Self-pay | Admitting: Family Medicine

## 2019-08-31 ENCOUNTER — Other Ambulatory Visit: Payer: Self-pay

## 2019-08-31 ENCOUNTER — Ambulatory Visit (INDEPENDENT_AMBULATORY_CARE_PROVIDER_SITE_OTHER): Payer: PPO

## 2019-08-31 DIAGNOSIS — Z0000 Encounter for general adult medical examination without abnormal findings: Secondary | ICD-10-CM | POA: Diagnosis not present

## 2019-08-31 DIAGNOSIS — R197 Diarrhea, unspecified: Secondary | ICD-10-CM | POA: Diagnosis not present

## 2019-08-31 MED ORDER — GLIPIZIDE ER 2.5 MG PO TB24: 2.5000 mg | ORAL_TABLET | Freq: Every day | ORAL | 5 refills | Status: DC

## 2019-08-31 NOTE — Patient Instructions (Signed)
Kayla Boyle , Thank you for taking time to come for your Medicare Wellness Visit. I appreciate your ongoing commitment to your health goals. Please review the following plan we discussed and let me know if I can assist you in the future.   Screening recommendations/referrals: Colonoscopy: No longer required.  Mammogram: No longer required.  Bone Density: Currently due, pt declined order today. Recommended yearly ophthalmology/optometry visit for glaucoma screening and checkup Recommended yearly dental visit for hygiene and checkup  Vaccinations: Influenza vaccine: Up to date Pneumococcal vaccine: Completed series Tdap vaccine: Up to date, due 03/2021 Shingles vaccine: Pt declines today.     Advanced directives: Please bring a copy of your POA (Power of Attorney) and/or Living Will to your next appointment.   Conditions/risks identified: Recommend to increase water intake to 6-8 8 oz glasses a day.   Next appointment: 11/23/19 @ 9:00 AM with Dr Sherrie Mustache. Declined scheduling an AWV for 2022 at this time.    Preventive Care 30 Years and Older, Female Preventive care refers to lifestyle choices and visits with your health care provider that can promote health and wellness. What does preventive care include?  A yearly physical exam. This is also called an annual well check.  Dental exams once or twice a year.  Routine eye exams. Ask your health care provider how often you should have your eyes checked.  Personal lifestyle choices, including:  Daily care of your teeth and gums.  Regular physical activity.  Eating a healthy diet.  Avoiding tobacco and drug use.  Limiting alcohol use.  Practicing safe sex.  Taking low-dose aspirin every day.  Taking vitamin and mineral supplements as recommended by your health care provider. What happens during an annual well check? The services and screenings done by your health care provider during your annual well check will depend on your  age, overall health, lifestyle risk factors, and family history of disease. Counseling  Your health care provider may ask you questions about your:  Alcohol use.  Tobacco use.  Drug use.  Emotional well-being.  Home and relationship well-being.  Sexual activity.  Eating habits.  History of falls.  Memory and ability to understand (cognition).  Work and work Astronomer.  Reproductive health. Screening  You may have the following tests or measurements:  Height, weight, and BMI.  Blood pressure.  Lipid and cholesterol levels. These may be checked every 5 years, or more frequently if you are over 74 years old.  Skin check.  Lung cancer screening. You may have this screening every year starting at age 57 if you have a 30-pack-year history of smoking and currently smoke or have quit within the past 15 years.  Fecal occult blood test (FOBT) of the stool. You may have this test every year starting at age 29.  Flexible sigmoidoscopy or colonoscopy. You may have a sigmoidoscopy every 5 years or a colonoscopy every 10 years starting at age 76.  Hepatitis C blood test.  Hepatitis B blood test.  Sexually transmitted disease (STD) testing.  Diabetes screening. This is done by checking your blood sugar (glucose) after you have not eaten for a while (fasting). You may have this done every 1-3 years.  Bone density scan. This is done to screen for osteoporosis. You may have this done starting at age 22.  Mammogram. This may be done every 1-2 years. Talk to your health care provider about how often you should have regular mammograms. Talk with your health care provider about your test  results, treatment options, and if necessary, the need for more tests. Vaccines  Your health care provider may recommend certain vaccines, such as:  Influenza vaccine. This is recommended every year.  Tetanus, diphtheria, and acellular pertussis (Tdap, Td) vaccine. You may need a Td booster  every 10 years.  Zoster vaccine. You may need this after age 27.  Pneumococcal 13-valent conjugate (PCV13) vaccine. One dose is recommended after age 65.  Pneumococcal polysaccharide (PPSV23) vaccine. One dose is recommended after age 30. Talk to your health care provider about which screenings and vaccines you need and how often you need them. This information is not intended to replace advice given to you by your health care provider. Make sure you discuss any questions you have with your health care provider. Document Released: 07/21/2015 Document Revised: 03/13/2016 Document Reviewed: 04/25/2015 Elsevier Interactive Patient Education  2017 [place] Prevention in the Home Falls can cause injuries. They can happen to people of all ages. There are many things you can do to make your home safe and to help prevent falls. What can I do on the outside of my home?  Regularly fix the edges of walkways and driveways and fix any cracks.  Remove anything that might make you trip as you walk through a door, such as a raised step or threshold.  Trim any bushes or trees on the path to your home.  Use bright outdoor lighting.  Clear any walking paths of anything that might make someone trip, such as rocks or tools.  Regularly check to see if handrails are loose or broken. Make sure that both sides of any steps have handrails.  Any raised decks and porches should have guardrails on the edges.  Have any leaves, snow, or ice cleared regularly.  Use sand or salt on walking paths during winter.  Clean up any spills in your garage right away. This includes oil or grease spills. What can I do in the bathroom?  Use night lights.  Install grab bars by the toilet and in the tub and shower. Do not use towel bars as grab bars.  Use non-skid mats or decals in the tub or shower.  If you need to sit down in the shower, use a plastic, non-slip stool.  Keep the floor dry. Clean up any  water that spills on the floor as soon as it happens.  Remove soap buildup in the tub or shower regularly.  Attach bath mats securely with double-sided non-slip rug tape.  Do not have throw rugs and other things on the floor that can make you trip. What can I do in the bedroom?  Use night lights.  Make sure that you have a light by your bed that is easy to reach.  Do not use any sheets or blankets that are too big for your bed. They should not hang down onto the floor.  Have a firm chair that has side arms. You can use this for support while you get dressed.  Do not have throw rugs and other things on the floor that can make you trip. What can I do in the kitchen?  Clean up any spills right away.  Avoid walking on wet floors.  Keep items that you use a lot in easy-to-reach places.  If you need to reach something above you, use a strong step stool that has a grab bar.  Keep electrical cords out of the way.  Do not use floor polish or wax that makes  floors slippery. If you must use wax, use non-skid floor wax.  Do not have throw rugs and other things on the floor that can make you trip. What can I do with my stairs?  Do not leave any items on the stairs.  Make sure that there are handrails on both sides of the stairs and use them. Fix handrails that are broken or loose. Make sure that handrails are as long as the stairways.  Check any carpeting to make sure that it is firmly attached to the stairs. Fix any carpet that is loose or worn.  Avoid having throw rugs at the top or bottom of the stairs. If you do have throw rugs, attach them to the floor with carpet tape.  Make sure that you have a light switch at the top of the stairs and the bottom of the stairs. If you do not have them, ask someone to add them for you. What else can I do to help prevent falls?  Wear shoes that:  Do not have high heels.  Have rubber bottoms.  Are comfortable and fit you well.  Are closed  at the toe. Do not wear sandals.  If you use a stepladder:  Make sure that it is fully opened. Do not climb a closed stepladder.  Make sure that both sides of the stepladder are locked into place.  Ask someone to hold it for you, if possible.  Clearly mark and make sure that you can see:  Any grab bars or handrails.  First and last steps.  Where the edge of each step is.  Use tools that help you move around (mobility aids) if they are needed. These include:  Canes.  Walkers.  Scooters.  Crutches.  Turn on the lights when you go into a dark area. Replace any light bulbs as soon as they burn out.  Set up your furniture so you have a clear path. Avoid moving your furniture around.  If any of your floors are uneven, fix them.  If there are any pets around you, be aware of where they are.  Review your medicines with your doctor. Some medicines can make you feel dizzy. This can increase your chance of falling. Ask your doctor what other things that you can do to help prevent falls. This information is not intended to replace advice given to you by your health care provider. Make sure you discuss any questions you have with your health care provider. Document Released: 04/20/2009 Document Revised: 11/30/2015 Document Reviewed: 07/29/2014 Elsevier Interactive Patient Education  2017 [employer].

## 2019-08-31 NOTE — Telephone Encounter (Signed)
Patient came by office to let you know she is not taking glucophage any longer. She is tired of having diarrhea.   She wants another DM medication sent in to [facility].

## 2019-08-31 NOTE — Telephone Encounter (Signed)
[facility]

## 2019-08-31 NOTE — Telephone Encounter (Signed)
Patient advised.

## 2019-09-04 LAB — STOOL CULTURE: E coli, Shiga toxin Assay: NEGATIVE

## 2019-09-04 LAB — FECAL LACTOFERRIN, QUANT: Lactoferrin, Fecal, Quant.: 7.26 ug/mL(g) — ABNORMAL HIGH (ref 0.00–7.24)

## 2019-09-05 LAB — C DIFFICILE, CYTOTOXIN B

## 2019-09-05 LAB — C DIFFICILE TOXINS A+B W/RFLX: C difficile Toxins A+B, EIA: NEGATIVE

## 2019-09-05 LAB — CLOSTRIDIUM DIFFICILE BY PCR: Toxigenic C. Difficile by PCR: NEGATIVE

## 2019-09-06 ENCOUNTER — Telehealth: Payer: Self-pay | Admitting: Family Medicine

## 2019-09-06 ENCOUNTER — Telehealth: Payer: Self-pay

## 2019-09-06 DIAGNOSIS — K580 Irritable bowel syndrome with diarrhea: Secondary | ICD-10-CM

## 2019-09-06 DIAGNOSIS — Z1211 Encounter for screening for malignant neoplasm of colon: Secondary | ICD-10-CM | POA: Diagnosis not present

## 2019-09-06 NOTE — Telephone Encounter (Signed)
Pt returned call and given lab result message, and she voiced understanding. She stated that she has been on diet that she advised to do. She is still having diarrhea and wants a medication that will slow this down or control it some. She is requesting a call back in regards to a medication to decrease her diarrhea.  She has her colo guard ready to go out this morning.

## 2019-09-06 NOTE — Telephone Encounter (Signed)
-----   Message from Malva Limes, MD sent at 09/06/2019  9:46 AM EST ----- Stools tests were all negative.  Some medications can cause diarrhea including metformin, magnesium and levothyroxine. She should put all of these on hold for a week. Be sure to avoid all dairy products and avoid gluten (wheat, rye, barley, oats). Let me know whether or not she is improved within a week.

## 2019-09-06 NOTE — Telephone Encounter (Signed)
Attempted to contact patient, line was busy. Okay for PEC to advise patient.

## 2019-09-06 NOTE — Telephone Encounter (Signed)
Per Dr. Theodis Aguas recommendations:   Some medications can cause diarrhea including metformin, magnesium and levothyroxine. She should put all of these on hold for a week. Be sure to avoid all dairy products and avoid gluten (wheat, rye, barley, oats). Let me know whether or not she is improved within a week.   I called patient to advise her that she should try putting these medications listed above on hold for a week to see if Diarrhea improves, then call back next week to let Dr. Sherrie Mustache know if there has been any improvement. Patient verbalized understanding and agreed to try putting medications on hold. Patient will call back in 1 week with an update.

## 2019-09-06 NOTE — Telephone Encounter (Signed)
Lab message given to pt. See TE.

## 2019-09-08 ENCOUNTER — Encounter: Payer: Medicare HMO | Admitting: Family Medicine

## 2019-09-09 LAB — COLOGUARD: Cologuard: POSITIVE — AB

## 2019-09-10 MED ORDER — DIPHENOXYLATE-ATROPINE 2.5-0.025 MG PO TABS: 1.0000 | ORAL_TABLET | Freq: Four times a day (QID) | ORAL | 1 refills | Status: DC | PRN

## 2019-09-10 NOTE — Telephone Encounter (Addendum)
Pt is calling and she is waiting on something for diarrhea. Pt said she had this diarrhea so long she lost 15 pounds. [facility]. Pt said diarrhea is so bad she can not go to church

## 2019-09-10 NOTE — Addendum Note (Signed)
Addended by: Malva Limes on: 09/10/2019 08:19 AM   Modules accepted: Orders

## 2019-09-10 NOTE — Telephone Encounter (Signed)
Have sent prescription to [facility], she needs referral to GI to look into this further

## 2019-09-10 NOTE — Telephone Encounter (Signed)
Patient advised. She agrees to GI referral. Referral placed.

## 2019-09-10 NOTE — Addendum Note (Signed)
Addended by: Sherre Poot on: 09/10/2019 08:59 AM   Modules accepted: Orders

## 2019-09-13 ENCOUNTER — Telehealth: Payer: Self-pay

## 2019-09-13 NOTE — Telephone Encounter (Signed)
Copied from CRM [id number]. Topic: General - Inquiry >> Sep 13, 2019 11:27 AM Deborha Payment wrote: Camelia Eng from [employer] called regarding patients cologaurd results. Camelia Eng was just making sure PCP got results. If office has results, Camelia Eng does not need a call back  Call back [phone]  Ref # [id number]

## 2019-09-13 NOTE — Telephone Encounter (Signed)
Patient advised.

## 2019-09-13 NOTE — Telephone Encounter (Signed)
Dr. Sherrie Mustache have you seen cologuard results on this patient come through?

## 2019-09-13 NOTE — Telephone Encounter (Signed)
-----   Message from Malva Limes, MD sent at 09/13/2019 12:52 PM EST ----- Cologuard test is positive. She will need to discuss with GI. Referral is already underway for IBS symptoms.

## 2019-09-15 ENCOUNTER — Encounter: Payer: Self-pay | Admitting: Gastroenterology

## 2019-09-16 ENCOUNTER — Ambulatory Visit (INDEPENDENT_AMBULATORY_CARE_PROVIDER_SITE_OTHER): Payer: PPO | Admitting: Gastroenterology

## 2019-09-16 ENCOUNTER — Encounter: Payer: Self-pay | Admitting: Gastroenterology

## 2019-09-16 DIAGNOSIS — R197 Diarrhea, unspecified: Secondary | ICD-10-CM | POA: Diagnosis not present

## 2019-09-16 DIAGNOSIS — R195 Other fecal abnormalities: Secondary | ICD-10-CM

## 2019-09-16 MED ORDER — LOPERAMIDE HCL 2 MG PO CAPS: 2.0000 mg | ORAL_CAPSULE | Freq: Four times a day (QID) | ORAL | 0 refills | Status: DC | PRN

## 2019-09-16 NOTE — Progress Notes (Signed)
Kayla Boyle [address]  [place]  [place], [place] [address]  Main: [phone]  Fax: [phone]   Gastroenterology Consultation  Referring Provider:     Birdie Sons, MD Primary Care Physician:  Birdie Sons, MD Reason for Consultation:     Diarrhea        HPI:   Virtual Visit via Telephone Note  I connected with patient on 09/16/19 at 11:30 AM EST by telephone and verified that I am speaking with the correct person using two identifiers.   I discussed the limitations, risks, security and privacy concerns of performing an evaluation and management service by telephone and the availability of in person appointments. I also discussed with the patient that there may be a patient responsible charge related to this service. The patient expressed understanding and agreed to proceed.  Location of the patient: Home Location of provider: Home Participating persons: Patient and provider only   History of Present Illness: Chief Complaint  Patient presents with  . New Patient (Initial Visit)  . Irritable Bowel Syndrome    Patient states she gave caffine,  dary gluten and lactose all red meat, . Patient states she has abdominal pain right before bowel movement  Patient states she is having diarrhea.   . Gastroesophageal Reflux    Patient states she was taking Nexium but was changed to Dexilant and still having alot of gerd symptoms      Kayla Boyle is a 85 y.o. y/o female referred for consultation & management  by Dr. Birdie Sons, MD.  Patient reports diarrhea that started December 2020.  Reporting 6 loose to watery bowel movements a day.  Had 1-2 formed bowel movements a day prior to that.  Recent infectious work-up by primary care provider include stool cultures for Salmonella, Shigella, Campylobacter, C. difficile was negative.  No prior history of similar symptoms.  Patient reports subjective weight loss due to this.  States every time she eats she has  diarrhea.  Unable to go to church or her appointments due to this.  Primary care provider ordered Cologuard testing which was positive.  No prior colonoscopies.  No family history of colon cancer.  No prior EGDs.  Is on Dexilant which is not controlling her reflux.  Reports burning sensation in her chest daily.  Was prescribed Lomotil that she is using 4 times a day and she states this has helped somewhat, but continues to have loose stools daily.  Was also on Metformin, and magnesium supplements.  This were discontinued 2 to 3 weeks ago and patient continues to have diarrhea.  No blood in stool.  Has history of COPD and sees a pulmonologist for it and states her pulmonologist told her that she cannot go under anesthesia in the past.  Past Medical History:  Diagnosis Date  . Allergy   . Arthritis    osteo  . Diabetes mellitus without complication ([place])    type 2  . H/O acute poliomyelitis 11/24/2014  . History of chicken pox   . Hyperlipidemia   . Hypertension   . Hypothyroidism   . Shingles     Past Surgical History:  Procedure Laterality Date  . ABDOMINAL HYSTERECTOMY  1963  . APPENDECTOMY    . CARPAL TUNNEL RELEASE    . CHOLECYSTECTOMY  10/2003   laproscopic  . ELBOW SURGERY Bilateral   . HAND SURGERY  01/2010  . LEG AMPUTATION BELOW KNEE Right 1975   traumatic; bleow knee  and club foot    Prior to Admission medications   Medication Sig Start Date End Date Taking? Authorizing Provider  albuterol (VENTOLIN HFA) 108 (90 Base) MCG/ACT inhaler Inhale 2 puffs into the lungs every 6 (six) hours as needed for wheezing or shortness of breath. 03/26/18  Yes Birdie Sons, MD  aspirin 81 MG tablet Take 1 tablet by mouth daily. 07/19/10  Yes [provider]  benzonatate (TESSALON) 100 MG capsule Take 1 capsule (100 mg total) by mouth 2 (two) times daily as needed for cough. 09/30/18  Yes Birdie Sons, MD  Blood Glucose Monitoring Suppl (ACCU-CHEK COMPACT CARE KIT) KIT As  directed 07/12/15  Yes Birdie Sons, MD  dexlansoprazole (DEXILANT) 60 MG capsule Take 1 capsule (60 mg total) by mouth daily. Take in place of Nexium 05/11/19  Yes Fisher, Kirstie Peri, MD  dicyclomine (BENTYL) 10 MG capsule Take 1 capsule (10 mg total) by mouth 4 (four) times daily -  before meals and at bedtime. For colitis 05/11/19  Yes Birdie Sons, MD  diphenoxylate-atropine (LOMOTIL) 2.5-0.025 MG tablet Take 1 tablet by mouth 4 (four) times daily as needed for diarrhea or loose stools. 09/10/19  Yes Birdie Sons, MD  furosemide (LASIX) 20 MG tablet Take 1 tablet (20 mg total) by mouth daily as needed (swelling). 01/20/18  Yes Birdie Sons, MD  gabapentin (NEURONTIN) 300 MG capsule Take 1 capsule (300 mg total) by mouth 3 (three) times daily. 07/20/19  Yes Birdie Sons, MD  glipiZIDE (GLUCOTROL XL) 2.5 MG 24 hr tablet Take 1 tablet (2.5 mg total) by mouth daily with breakfast. 08/31/19  Yes Birdie Sons, MD  glucose blood (ACCU-CHEK AVIVA PLUS) test strip Use to check blood sugar once a day 01/02/15  Yes Birdie Sons, MD  levothyroxine (SYNTHROID) 75 MCG tablet TAKE 1 TABLET BY MOUTH EVERY DAY 12/27/18  Yes Birdie Sons, MD  lisinopril (PRINIVIL,ZESTRIL) 5 MG tablet TAKE 1 TABLET BY MOUTH EVERY DAY 09/21/18  Yes Birdie Sons, MD  lovastatin (MEVACOR) 40 MG tablet TAKE 1 TABLET BY MOUTH AT BEDTIME 12/28/18  Yes Birdie Sons, MD  methocarbamol (ROBAXIN) 500 MG tablet TAKE TWO TABLETS BY MOUTH EVERY 6 HOURS AS NEEDED FOR MUSCLE SPASMS 01/20/18  Yes Birdie Sons, MD  montelukast (SINGULAIR) 10 MG tablet Take 1 tablet (10 mg total) by mouth daily. 05/11/19  Yes Birdie Sons, MD  risedronate (ACTONEL) 35 MG tablet TAKE 1 TABLET BY MOUTH ONCE A WEEK. TAKEWITH A FULL GLASS OF WATER AND DO NOT LIE BACK DOWN FOR 30 MINUTES AFTER. 11/21/18  Yes Birdie Sons, MD  traMADol (ULTRAM) 50 MG tablet Take 1-2 tablets (50-100 mg total) by mouth every 8 (eight) hours as needed. 07/20/19   Yes Birdie Sons, MD  vitamin B-12 (CYANOCOBALAMIN) 100 MCG tablet Take 800 mcg by mouth daily.     [provider]  Vitamin D, Cholecalciferol, 400 UNITS CHEW Chew 1 tablet by mouth daily.    [provider]    Family History  Problem Relation Age of Onset  . Pancreatic cancer Father   . Prostate cancer Brother   . Diabetes Brother        type 2  . Hepatitis C Daughter   . HIV/AIDS Son   . Cancer Son        LYMPH NODE CANCER  . Cancer Other 57       small cell cancer  .  Glaucoma Sister   . Cancer Sister        kidney removed     Social History   Tobacco Use  . Smoking status: Former Smoker    Types: Cigarettes    Quit date: 10/06/2001    Years since quitting: 17.9  . Smokeless tobacco: Never Used  Substance Use Topics  . Alcohol use: No    Alcohol/week: 0.0 standard drinks  . Drug use: No    Allergies as of 09/16/2019 - Review Complete 09/16/2019  Allergen Reaction Noted  . Fosamax [alendronate sodium] Rash 11/11/2017  . Bee venom Hives 01/02/2015  . Codeine Rash and Swelling 11/24/2014  . Penicillins Rash and Swelling 11/24/2014    Review of Systems:    All systems reviewed and negative except where noted in HPI.   Observations/Objective:  Labs: CBC    Component Value Date/Time   WBC 6.9 10/21/2017 0905   RBC 3.93 10/21/2017 0905   HGB 11.6 10/21/2017 0905   HCT 35.4 10/21/2017 0905   PLT 299 10/21/2017 0905   MCV 90 10/21/2017 0905   MCH 29.5 10/21/2017 0905   MCHC 32.8 10/21/2017 0905   RDW 12.9 10/21/2017 0905   CMP     Component Value Date/Time   NA 140 05/12/2018 1058   K 4.0 05/12/2018 1058   CL 102 05/12/2018 1058   CO2 23 05/12/2018 1058   GLUCOSE 78 05/12/2018 1058   BUN 16 05/12/2018 1058   CREATININE 1.18 (H) 05/12/2018 1058   CALCIUM 9.0 05/12/2018 1058   PROT 6.3 05/12/2018 1058   ALBUMIN 4.2 05/12/2018 1058   AST 13 05/12/2018 1058   ALT 9 05/12/2018 1058   ALKPHOS 65 05/12/2018 1058   BILITOT 0.3  05/12/2018 1058   GFRNONAA 43 (L) 05/12/2018 1058   GFRAA 50 (L) 05/12/2018 1058    Imaging Studies: No results found.  Assessment and Plan:   Kayla Boyle is a 85 y.o. y/o female has been referred for diarrhea  Assessment and Plan: Patient is on Synthroid and her TSH has not been checked in over a year.  Will check TSH as this could be causing her symptoms.  Discontinue Dexilant as this could be leading to her diarrhea.  It is not controlling her reflux anyway.  Will try new reflux medications once her diarrhea gets better.  Discontinue Lomotil.  Use Imodium instead as Lomotil is not helping.  Positive Cologuard with no prior colonoscopy.  Discussed need for colonoscopy to rule out malignancy given positive Cologuard.  However, patient will need clearance from pulmonology prior to scheduling.  Would prefer to schedule at [facility].  Will send parents request to pulmonary before scheduling her procedure.  Patient is agreeable to scheduling if pulmonary clears her.  Follow-up in 2 to 3 weeks  Follow Up Instructions:   I discussed the assessment and treatment plan with the patient. The patient was provided an opportunity to ask questions and all were answered. The patient agreed with the plan and demonstrated an understanding of the instructions.   The patient was advised to call back or seek an in-person evaluation if the symptoms worsen or if the condition fails to improve as anticipated.  I provided 76mnutes of non-face-to-face time during this encounter.   VVirgel Manifold MD  Speech recognition software was used to dictate the above note.

## 2019-09-17 DIAGNOSIS — R197 Diarrhea, unspecified: Secondary | ICD-10-CM | POA: Diagnosis not present

## 2019-09-18 LAB — TSH: TSH: 13.3 u[IU]/mL — ABNORMAL HIGH (ref 0.450–4.500)

## 2019-09-22 ENCOUNTER — Telehealth: Payer: Self-pay

## 2019-09-22 NOTE — Telephone Encounter (Signed)
Copied from CRM [id number]. Topic: General - Other >> Sep 22, 2019  1:56 PM Marylen Ponto wrote: Reason for CRM: Pt stated her GI doctor sent Dr. Sherrie Mustache her results and she was told to call the office for an update on her medications. Pt stated she needs another medication for her diabetes as well as another medication for her acid reflux because the medicine she has is not working. Pt requests call back.

## 2019-09-22 NOTE — Telephone Encounter (Signed)
Her thyroid is off. She needs to increase levothyroxine to daily, #30 rf x 1. She can change Dexilant to esomeprazole 40mg  once a day, #30, rf x `1. Need to schedule follow up in 4 weeks to recheck thyroid and hgba1c

## 2019-09-22 NOTE — Telephone Encounter (Signed)
-----   Message from Pasty Spillers, MD sent at 09/22/2019  1:32 PM EDT ----- Dr. Sherrie Mustache please see TSH results for your patient. We have asked her to follow up with you.   [name] please let the patient know, her thyroid levels are very abnormal. I am forwarding her results to her PCP as well, and she needs to follow up with them for this.  Her thyroid issues will need to be addressed first before we can schedule her for any procedures. If the procedures are already scheduled please cancel.  Her diarrhea might improve after her thyroid workup and treatment. If it does not and procedures are needed we will get clearance from her providers.  Televisit in 4 weeks.

## 2019-09-22 NOTE — Telephone Encounter (Signed)
Patient verbalized understanding. Made 4 week follow up appointment. Patient states she will call PCP office to see if they can see her sooner then May

## 2019-09-23 ENCOUNTER — Telehealth: Payer: Self-pay

## 2019-09-23 MED ORDER — LEVOTHYROXINE SODIUM 100 MCG PO TABS: 100.0000 ug | ORAL_TABLET | Freq: Every day | ORAL | 1 refills | Status: DC

## 2019-09-23 MED ORDER — ESOMEPRAZOLE MAGNESIUM 40 MG PO CPDR: 40.0000 mg | DELAYED_RELEASE_CAPSULE | Freq: Every day | ORAL | 1 refills | Status: DC

## 2019-09-23 NOTE — Telephone Encounter (Signed)
She may not need anything else, her a1c has been good. If her a1c goes up above 8 when she comes in for follow up we will go up on the glipizide.

## 2019-09-23 NOTE — Telephone Encounter (Signed)
Patient advised. RX sent to [facility]. Patient states she discontinued Metformin due to diarrhea. She is requesting a different medication be sent to [facility]

## 2019-09-23 NOTE — Telephone Encounter (Signed)
Patient advised.

## 2019-09-23 NOTE — Telephone Encounter (Signed)
Called Dr. Mayo Ao office to check on the status of the cardiac clearance form. They state they are trying to get patient in for a appointment. There scheduled is full so trying to find a place to work patient in. Will send back to [place] as soon as patient has appointment

## 2019-09-23 NOTE — Addendum Note (Signed)
Addended by: Sherre Poot on: 09/23/2019 01:14 PM   Modules accepted: Orders

## 2019-10-05 ENCOUNTER — Ambulatory Visit: Payer: PPO | Admitting: Gastroenterology

## 2019-10-05 ENCOUNTER — Other Ambulatory Visit: Payer: Self-pay | Admitting: Family Medicine

## 2019-10-05 DIAGNOSIS — I10 Essential (primary) hypertension: Secondary | ICD-10-CM

## 2019-10-05 NOTE — Telephone Encounter (Signed)
Requested Prescriptions  Pending Prescriptions Disp Refills  . lisinopril (ZESTRIL) 5 MG tablet [Pharmacy Med Name: LISINOPRIL 5 MG TAB] 90 tablet 4    Sig: TAKE 1 TABLET BY MOUTH DAILY     Cardiovascular:  ACE Inhibitors Failed - 10/05/2019  9:59 AM      Failed - Cr in normal range and within 180 days    Creatinine, Ser  Date Value Ref Range Status  05/12/2018 1.18 (H) 0.57 - 1.00 mg/dL Final         Failed - K in normal range and within 180 days    Potassium  Date Value Ref Range Status  05/12/2018 4.0 3.5 - 5.2 mmol/L Final         Passed - Patient is not pregnant      Passed - Last BP in normal range    BP Readings from Last 1 Encounters:  05/12/18 126/68         Passed - Valid encounter within last 6 months    Recent Outpatient Visits          4 months ago Seasonal allergic rhinitis due to pollen   [facility] Malva Limes, MD   6 months ago Acute maxillary sinusitis, recurrence not specified   [facility] Chrismon, Jodell [place], [place]   1 year ago Annual physical exam   [facility] Malva Limes, MD   1 year ago Type 2 diabetes mellitus with other diabetic kidney complication, without long-term current use of insulin [facility])   [facility] Malva Limes, MD   1 year ago Hypothyroidism, unspecified type   [facility] Malva Limes, MD      Future Appointments            In 2 weeks Pasty Spillers, MD [facility] [place]   In 3 weeks Fisher, Demetrios Isaacs, MD [facility]

## 2019-10-19 ENCOUNTER — Encounter: Payer: Self-pay | Admitting: Gastroenterology

## 2019-10-20 ENCOUNTER — Encounter: Payer: Self-pay | Admitting: Gastroenterology

## 2019-10-20 ENCOUNTER — Ambulatory Visit (INDEPENDENT_AMBULATORY_CARE_PROVIDER_SITE_OTHER): Payer: PPO | Admitting: Gastroenterology

## 2019-10-20 DIAGNOSIS — R195 Other fecal abnormalities: Secondary | ICD-10-CM

## 2019-10-20 DIAGNOSIS — F458 Other somatoform disorders: Secondary | ICD-10-CM | POA: Diagnosis not present

## 2019-10-20 MED ORDER — METAMUCIL 28 % PO PACK: 1.0000 | PACK | Freq: Every day | ORAL | 1 refills | Status: DC

## 2019-10-20 NOTE — Progress Notes (Signed)
Kayla Antigua, MD [address]  [place]  [place], [place] [address]  Main: [phone]  Fax: [phone]   Primary Care Physician: Birdie Sons, MD  Virtual Visit via Telephone Note  I connected with patient on 10/20/19 at 10:00 AM EDT by telephone and verified that I am speaking with the correct person using two identifiers.   I discussed the limitations, risks, security and privacy concerns of performing an evaluation and management service by telephone and the availability of in person appointments. I also discussed with the patient that there may be a patient responsible charge related to this service. The patient expressed understanding and agreed to proceed.  Location of Patient: Home Location of Provider: Home Persons involved: Patient and provider only during the visit (nursing staff and front desk staff was involved in communicating with the patient prior to the appointment, reviewing medications and checking them in)   History of Present Illness: Chief Complaint  Patient presents with  . Diarrhea    Patient states the diarrhea is some better. Patient states she still have lose bowels. Patient states she have diarrhea at least once a day.      HPI: Kayla Boyle is a 85 y.o. female here for follow-up of diarrhea.  Has been taking Imodium instead of Lomotil which is helping better.  States she will have 2 days of hard or formed bowel movements followed by 1 day of loose bowel movements or diarrhea.  No blood in stool.  Was also found to be Cologuard positive on testing done by primary care provider.    Dexilant was changed to Nexium by primary care provider.  Patient states she has "acid reflux".  When I asked what her symptoms specifically are, she states she has frequent burping.    Previous history: Patient reports diarrhea that started December 2020.  Reporting 6 loose to watery bowel movements a day.  Had 1-2 formed bowel movements a day prior to  that.  Recent infectious work-up by primary care provider include stool cultures for Salmonella, Shigella, Campylobacter, C. difficile was negative.  No prior history of similar symptoms.  Patient reports subjective weight loss due to this.  States every time she eats she has diarrhea.  Unable to go to church or her appointments due to this.  Primary care provider ordered Cologuard testing which was positive.  No prior colonoscopies.  No family history of colon cancer.  No prior EGDs.  Was previously on Dexilant which is not controlling her reflux.  Reports burning sensation in her chest daily.  Was prescribed Lomotil that she is using 4 times a day and she states this has helped somewhat, but continues to have loose stools daily.  Was also on Metformin, and magnesium supplements.  This were discontinued 2 to 3 weeks ago and patient continues to have diarrhea.  No blood in stool.  Has history of COPD and sees a pulmonologist for it and states her pulmonologist told her that she cannot go under anesthesia in the past.  Current Outpatient Medications  Medication Sig Dispense Refill  . albuterol (VENTOLIN HFA) 108 (90 Base) MCG/ACT inhaler Inhale 2 puffs into the lungs every 6 (six) hours as needed for wheezing or shortness of breath. 54 g 1  . aspirin 81 MG tablet Take 1 tablet by mouth daily.    . Blood Glucose Monitoring Suppl (ACCU-CHEK COMPACT CARE KIT) KIT As directed 1 each 0  . dexlansoprazole (DEXILANT) 60 MG capsule Take 1  capsule (60 mg total) by mouth daily. Take in place of Nexium 90 capsule 3  . dicyclomine (BENTYL) 10 MG capsule Take 1 capsule (10 mg total) by mouth 4 (four) times daily -  before meals and at bedtime. For colitis 90 capsule 3  . diphenoxylate-atropine (LOMOTIL) 2.5-0.025 MG tablet Take 1 tablet by mouth 4 (four) times daily as needed for diarrhea or loose stools. 60 tablet 1  . esomeprazole (NEXIUM) 40 MG capsule Take 1 capsule (40 mg total) by mouth daily. 30 capsule 1  .  furosemide (LASIX) 20 MG tablet Take 1 tablet (20 mg total) by mouth daily as needed (swelling). 90 tablet 3  . gabapentin (NEURONTIN) 300 MG capsule Take 1 capsule (300 mg total) by mouth 3 (three) times daily. 90 capsule 3  . glucose blood (ACCU-CHEK AVIVA PLUS) test strip Use to check blood sugar once a day 100 each 4  . levothyroxine (SYNTHROID) 100 MCG tablet Take 1 tablet (100 mcg total) by mouth daily. 30 tablet 1  . lisinopril (ZESTRIL) 5 MG tablet TAKE 1 TABLET BY MOUTH DAILY 90 tablet 4  . loperamide (IMODIUM) 2 MG capsule Take 1 capsule (2 mg total) by mouth 4 (four) times daily as needed for up to 30 doses for diarrhea or loose stools. 30 capsule 0  . lovastatin (MEVACOR) 40 MG tablet TAKE 1 TABLET BY MOUTH AT BEDTIME 90 tablet 4  . methocarbamol (ROBAXIN) 500 MG tablet TAKE TWO TABLETS BY MOUTH EVERY 6 HOURS AS NEEDED FOR MUSCLE SPASMS 60 tablet 3  . montelukast (SINGULAIR) 10 MG tablet Take 1 tablet (10 mg total) by mouth daily. 90 tablet 4  . risedronate (ACTONEL) 35 MG tablet TAKE 1 TABLET BY MOUTH ONCE A WEEK. TAKEWITH A FULL GLASS OF WATER AND DO NOT LIE BACK DOWN FOR 30 MINUTES AFTER. 12 tablet 4  . traMADol (ULTRAM) 50 MG tablet Take 1-2 tablets (50-100 mg total) by mouth every 8 (eight) hours as needed. 90 tablet 4  . vitamin B-12 (CYANOCOBALAMIN) 100 MCG tablet Take 800 mcg by mouth daily.     . Vitamin D, Cholecalciferol, 400 UNITS CHEW Chew 1 tablet by mouth daily.    . benzonatate (TESSALON) 100 MG capsule Take 1 capsule (100 mg total) by mouth 2 (two) times daily as needed for cough. (Patient not taking: Reported on 10/19/2019) 20 capsule 2  . glipiZIDE (GLUCOTROL XL) 2.5 MG 24 hr tablet Take 1 tablet (2.5 mg total) by mouth daily with breakfast. (Patient not taking: Reported on 10/19/2019) 30 tablet 5   No current facility-administered medications for this visit.    Allergies as of 10/20/2019 - Review Complete 10/20/2019  Allergen Reaction Noted  . Fosamax [alendronate  sodium] Rash 11/11/2017  . Bee venom Hives 01/02/2015  . Codeine Rash and Swelling 11/24/2014  . Penicillins Rash and Swelling 11/24/2014    Review of Systems:    All systems reviewed and negative except where noted in HPI.   Observations/Objective:  Labs: CMP     Component Value Date/Time   NA 140 05/12/2018 1058   K 4.0 05/12/2018 1058   CL 102 05/12/2018 1058   CO2 23 05/12/2018 1058   GLUCOSE 78 05/12/2018 1058   BUN 16 05/12/2018 1058   CREATININE 1.18 (H) 05/12/2018 1058   CALCIUM 9.0 05/12/2018 1058   PROT 6.3 05/12/2018 1058   ALBUMIN 4.2 05/12/2018 1058   AST 13 05/12/2018 1058   ALT 9 05/12/2018 1058   ALKPHOS 65 05/12/2018 1058  BILITOT 0.3 05/12/2018 1058   GFRNONAA 43 (L) 05/12/2018 1058   GFRAA 50 (L) 05/12/2018 1058   Lab Results  Component Value Date   WBC 6.9 10/21/2017   HGB 11.6 10/21/2017   HCT 35.4 10/21/2017   MCV 90 10/21/2017   PLT 299 10/21/2017    Imaging Studies: No results found.  Assessment and Plan:   Kayla Boyle is a 85 y.o. y/o female with diarrhea, positive Cologuard  Assessment and Plan: Diarrhea is now better with Imodium However, she is reporting 2 days of hard bowel movements or formed stool, followed by 1 day of loose stools.  I have asked her to start taking Metamucil as this can help bulk her stool and also avoid the constipation that she is having.  The loose stools and diarrhea that she is having after days of constipation is likely from postobstructive diarrhea itself  We discussed colonoscopy for positive Cologuard.  Patient states she will not take the prep for the colonoscopy.  We discussed that a positive Cologuard can mean an underlying colon cancer and then tell her colonoscopy was done, which requires a colonoscopy prep, colon cancer cannot be ruled out.  She understands this risk and does not want a colonoscopy at this time.  However, she states she will discuss it with Dr. Caryn Section at her next appointment  next week and let us know if she changes her mind.  We will give her her follow-up appointment with [place] as well  If she decides to proceed with a colonoscopy she will need pulmonary clearance  Her frequent burping symptoms may be related to aerophagia.  Lifestyle modifications to prevent this were discussed including not swallowing with a straw, not drinking carbonated beverages, trying to avoid swallowing air inadvertently.  Follow Up Instructions:    I discussed the assessment and treatment plan with the patient. The patient was provided an opportunity to ask questions and all were answered. The patient agreed with the plan and demonstrated an understanding of the instructions.   The patient was advised to call back or seek an in-person evaluation if the symptoms worsen or if the condition fails to improve as anticipated.  I provided 15 minutes of non-face-to-face time during this encounter. Additional time was spent in reviewing patient's chart, placing orders etc.   Virgel Manifold, MD  Speech recognition software was used to dictate this note.

## 2019-10-20 NOTE — Patient Instructions (Addendum)
Your symptoms can be due to "Air swallowing (aerophagia) is the major source of stomach gas. Several milliliters of air are ingested in each normal swallow. Larger amounts are swallowed when food is gulped."  Avoid drinking liquids with a straw Avoid chewing gum Avoid smoking Avoid carbonated beverages Be aware and pay attention to see if you take gulps or swallow air throughout the day

## 2019-10-25 NOTE — Progress Notes (Signed)
  I,Kayla Boyle,acting as a scribe for Donald Fisher, MD.,have documented all relevant documentation on the behalf of Donald Fisher, MD,as directed by  Donald Fisher, MD while in the presence of Donald Fisher, MD.   Established patient visit    Patient: Kayla Boyle   DOB: 08/08/1934   85 y.o. Female  MRN: [id number] Visit Date: 10/27/2019  Today's healthcare provider: Donald Fisher, MD   Chief Complaint  Patient presents with  . Diabetes  . Hypothyroidism  . Diarrhea  . Hypertension  . Hyperlipidemia  . Osteoporosis  . Other    Vitamin D    Subjective    HPI Diabetes Mellitus Type II, Follow-up  Lab Results  Component Value Date   HGBA1C 6.1 (A) 05/12/2018   HGBA1C 6.3 (A) 01/20/2018   HGBA1C 6.0 (H) 10/21/2017   Last seen for diabetes 5 months ago.  Management since then includes discontinuing Metformin due to possible cause of diarrhea. Patient was started on Glipizide. She reports good compliance with treatment. She is not having side effects.  Symptoms: Yes fatigue No foot ulcerations No appetite changes No nausea No paresthesia (numbness or tingling) of the feet  Yes polydipsia (excessive thirst) No polyuria (frequent urination) No visual disturbances  No vomiting  Home blood sugar records: fasting range: 120's  Episodes of hypoglycemia? Yes has occurred 3 times in the last 4 monhs   Current insulin regiment: none Most Recent Eye Exam: 09/2019 Current exercise: none Current diet habits: in general, a "healthy" diet    Pertinent Labs: Lab Results  Component Value Date   CHOL 165 05/12/2018   HDL 56 05/12/2018   LDLCALC 79 05/12/2018   TRIG 151 (H) 05/12/2018   CHOLHDL 2.9 05/12/2018   Lab Results  Component Value Date   NA 140 05/12/2018   K 4.0 05/12/2018   CL 102 05/12/2018   CO2 23 05/12/2018   GLUCOSE 78 05/12/2018   BUN 16 05/12/2018   CREATININE 1.18 (H) 05/12/2018   CALCIUM 9.0 05/12/2018   GFRNONAA 43 (L) 05/12/2018   GFRAA 50 (L) 05/12/2018     Wt Readings from Last 3 Encounters:  05/20/17 160 lb (72.6 kg)  04/17/17 161 lb 3.2 oz (73.1 kg)  01/18/16 159 lb (72.1 kg)    Hypothyroid, follow-up  Lab Results  Component Value Date   TSH 13.300 (H) 09/17/2019   TSH 1.240 05/12/2018   TSH 1.010 10/21/2017   T4TOTAL 8.1 10/17/2016   Wt Readings from Last 3 Encounters:  05/20/17 160 lb (72.6 kg)  04/17/17 161 lb 3.2 oz (73.1 kg)  01/18/16 159 lb (72.1 kg)    Recent change for hypothyroid was 1 month ago.  Management since that visit includes increasing Levothyroxine to 100mcg daily. She reports good compliance with treatment. She is not having side effects.  Symptoms: Yes change in energy level No constipation Yes diarrhea No heat / cold intolerance No nervousness No palpitations No weight changes  -----------------------------------------------------------------------------------------      Medications: Outpatient Medications Prior to Visit  Medication Sig  . albuterol (VENTOLIN HFA) 108 (90 Base) MCG/ACT inhaler Inhale 2 puffs into the lungs every 6 (six) hours as needed for wheezing or shortness of breath.  . aspirin 81 MG tablet Take 1 tablet by mouth daily.  . benzonatate (TESSALON) 100 MG capsule Take 1 capsule (100 mg total) by mouth 2 (two) times daily as needed for cough.  . Blood Glucose Monitoring Suppl (ACCU-CHEK COMPACT CARE KIT) KIT As directed  .   dexlansoprazole (DEXILANT) 60 MG capsule Take 1 capsule (60 mg total) by mouth daily. Take in place of Nexium  . dicyclomine (BENTYL) 10 MG capsule Take 1 capsule (10 mg total) by mouth 4 (four) times daily -  before meals and at bedtime. For colitis  . diphenoxylate-atropine (LOMOTIL) 2.5-0.025 MG tablet Take 1 tablet by mouth 4 (four) times daily as needed for diarrhea or loose stools.  Marland Kitchen esomeprazole (NEXIUM) 40 MG capsule Take 1 capsule (40 mg total) by mouth daily.  . furosemide (LASIX) 20 MG tablet Take 1 tablet (20 mg  total) by mouth daily as needed (swelling).  . gabapentin (NEURONTIN) 300 MG capsule Take 1 capsule (300 mg total) by mouth 3 (three) times daily.  Marland Kitchen glipiZIDE (GLUCOTROL XL) 2.5 MG 24 hr tablet Take 1 tablet (2.5 mg total) by mouth daily with breakfast.  . glucose blood (ACCU-CHEK AVIVA PLUS) test strip Use to check blood sugar once a day  . levothyroxine (SYNTHROID) 100 MCG tablet Take 1 tablet (100 mcg total) by mouth daily.  Marland Kitchen lisinopril (ZESTRIL) 5 MG tablet TAKE 1 TABLET BY MOUTH DAILY  . loperamide (IMODIUM) 2 MG capsule Take 1 capsule (2 mg total) by mouth 4 (four) times daily as needed for up to 30 doses for diarrhea or loose stools.  . lovastatin (MEVACOR) 40 MG tablet TAKE 1 TABLET BY MOUTH AT BEDTIME  . methocarbamol (ROBAXIN) 500 MG tablet TAKE TWO TABLETS BY MOUTH EVERY 6 HOURS AS NEEDED FOR MUSCLE SPASMS  . montelukast (SINGULAIR) 10 MG tablet Take 1 tablet (10 mg total) by mouth daily.  . risedronate (ACTONEL) 35 MG tablet TAKE 1 TABLET BY MOUTH ONCE A WEEK. TAKEWITH A FULL GLASS OF WATER AND DO NOT LIE BACK DOWN FOR 30 MINUTES AFTER.  . traMADol (ULTRAM) 50 MG tablet Take 1-2 tablets (50-100 mg total) by mouth every 8 (eight) hours as needed.  . psyllium (METAMUCIL) 28 % packet Take 1 packet by mouth daily. (Patient not taking: Reported on 10/27/2019)  . vitamin B-12 (CYANOCOBALAMIN) 100 MCG tablet Take 800 mcg by mouth daily.   . Vitamin D, Cholecalciferol, 400 UNITS CHEW Chew 1 tablet by mouth daily.   No facility-administered medications prior to visit.    Review of Systems  Constitutional: Negative for appetite change, chills, fatigue and fever.  Respiratory: Negative for chest tightness and shortness of breath.   Cardiovascular: Negative for chest pain and palpitations.  Gastrointestinal: Negative for abdominal pain, nausea and vomiting.  Neurological: Negative for dizziness and weakness.       Objective    BP 106/62 (BP Location: Left Arm, Patient Position:  Sitting, Cuff Size: Large)   Pulse 88   Temp (!) 97.5 F (36.4 C) (Temporal)   SpO2 98% Comment: room air   Physical Exam   General: Appearance:    Well developed, well nourished female in no acute distress  Eyes:    PERRL, conjunctiva/corneas clear, EOM's intact       Lungs:     Clear to auscultation bilaterally, respirations unlabored  Heart:    Normal heart rate. Normal rhythm. No murmurs, rubs, or gallops.   MS:   Below knee amputation of right lower extremity is noted.   Neurologic:   Awake, alert, oriented x 3. No apparent focal neurological           defect.         No results found for any visits on 10/27/19.   Assessment & Plan  1. Type 2 diabetes mellitus with other diabetic kidney complication, without long-term current use of insulin (HCC)  - HgB A1c  2. Hypothyroidism, unspecified type  - TSH  3. Status post below knee amputation of right lower extremity ([place]) Compensated well with no active complications.   4. COPD, moderate ([place]) Doing well with prn albuterol inhaler  5. Irritable bowel syndrome with diarrhea Had seen GI with only finding of mildly elevated TSH and has since increase dose of levothyroxine with no improvement. She likely has IBS. Will try- dicyclomine (BENTYL) 10 MG capsule; Take 1 capsule (10 mg total) by mouth 4 (four) times daily -  before meals and at bedtime. For colitis  Dispense: 90 capsule; Refill: 3  6. Essential hypertension Well controlled.  Continue current medications.    7. Vitamin D deficiency  - VITAMIN D 25 Hydroxy (Vit-D Deficiency, Fractures)  8. Hyperlipidemia, unspecified hyperlipidemia type She is tolerating lovastatin well with no adverse effects.   - Comprehensive metabolic panel - Lipid panel - CBC  9. Diarrhea, unspecified type She has had cholecystectomy. Will try - cholestyramine (QUESTRAN) 4 g packet; Take 1 packet (4 g total) by mouth 2 (two) times daily.  Dispense: 60 each; Refill: 1  10. Stage 3b  chronic kidney disease Check creatinine today  11. Osteoporosis without current pathological fracture, unspecified osteoporosis type  - risedronate (ACTONEL) 35 MG tablet; TAKE 1 TABLET BY MOUTH ONCE A WEEK WITH A FULL GLASS OF WATER AND DO NOT LIE BACK DOWN FOR 30 MINUTES AFTER.  Dispense: 12 tablet; Refill: 4   No follow-ups on file.     Addressed extensive list of chronic and acute medical problems today requiring 45 minutes reviewing her medical record, counseling patient regarding her conditions and coordination of care.     The entirety of the information documented in the History of Present Illness, Review of Systems and Physical Exam were personally obtained by me. Portions of this information were initially documented by the CMA and reviewed by me for thoroughness and accuracy.      Lelon Huh, MD  [facility] [phone] (phone) [phone] (fax)  [place]

## 2019-10-27 ENCOUNTER — Ambulatory Visit (INDEPENDENT_AMBULATORY_CARE_PROVIDER_SITE_OTHER): Payer: PPO | Admitting: Family Medicine

## 2019-10-27 ENCOUNTER — Other Ambulatory Visit: Payer: Self-pay

## 2019-10-27 ENCOUNTER — Encounter: Payer: Self-pay | Admitting: Family Medicine

## 2019-10-27 VITALS — BP 106/62 | HR 88 | Temp 97.5°F

## 2019-10-27 DIAGNOSIS — E785 Hyperlipidemia, unspecified: Secondary | ICD-10-CM | POA: Diagnosis not present

## 2019-10-27 DIAGNOSIS — K580 Irritable bowel syndrome with diarrhea: Secondary | ICD-10-CM | POA: Diagnosis not present

## 2019-10-27 DIAGNOSIS — N1832 Chronic kidney disease, stage 3b: Secondary | ICD-10-CM

## 2019-10-27 DIAGNOSIS — I10 Essential (primary) hypertension: Secondary | ICD-10-CM | POA: Diagnosis not present

## 2019-10-27 DIAGNOSIS — M810 Age-related osteoporosis without current pathological fracture: Secondary | ICD-10-CM | POA: Diagnosis not present

## 2019-10-27 DIAGNOSIS — J449 Chronic obstructive pulmonary disease, unspecified: Secondary | ICD-10-CM

## 2019-10-27 DIAGNOSIS — Z89511 Acquired absence of right leg below knee: Secondary | ICD-10-CM | POA: Diagnosis not present

## 2019-10-27 DIAGNOSIS — E559 Vitamin D deficiency, unspecified: Secondary | ICD-10-CM

## 2019-10-27 DIAGNOSIS — E1129 Type 2 diabetes mellitus with other diabetic kidney complication: Secondary | ICD-10-CM

## 2019-10-27 DIAGNOSIS — R197 Diarrhea, unspecified: Secondary | ICD-10-CM

## 2019-10-27 DIAGNOSIS — E039 Hypothyroidism, unspecified: Secondary | ICD-10-CM

## 2019-10-27 MED ORDER — DICYCLOMINE HCL 10 MG PO CAPS: 10.0000 mg | ORAL_CAPSULE | Freq: Three times a day (TID) | ORAL | 3 refills | Status: DC

## 2019-10-27 MED ORDER — DIPHENOXYLATE-ATROPINE 2.5-0.025 MG PO TABS: 1.0000 | ORAL_TABLET | Freq: Four times a day (QID) | ORAL | 1 refills | Status: DC | PRN

## 2019-10-27 MED ORDER — CHOLESTYRAMINE 4 G PO PACK: 4.0000 g | PACK | Freq: Two times a day (BID) | ORAL | 1 refills | Status: DC

## 2019-10-27 MED ORDER — RISEDRONATE SODIUM 35 MG PO TABS: ORAL_TABLET | ORAL | 4 refills | Status: DC

## 2019-10-28 ENCOUNTER — Telehealth: Payer: Self-pay

## 2019-10-28 LAB — CBC
Hematocrit: 33 % — ABNORMAL LOW (ref 34.0–46.6)
Hemoglobin: 10.9 g/dL — ABNORMAL LOW (ref 11.1–15.9)
MCH: 29.9 pg (ref 26.6–33.0)
MCHC: 33 g/dL (ref 31.5–35.7)
MCV: 91 fL (ref 79–97)
Platelets: 319 10*3/uL (ref 150–450)
RBC: 3.64 x10E6/uL — ABNORMAL LOW (ref 3.77–5.28)
RDW: 12.1 % (ref 11.7–15.4)
WBC: 8.2 10*3/uL (ref 3.4–10.8)

## 2019-10-28 LAB — TSH: TSH: 0.049 u[IU]/mL — ABNORMAL LOW (ref 0.450–4.500)

## 2019-10-28 LAB — COMPREHENSIVE METABOLIC PANEL WITH GFR
ALT: 8 [IU]/L (ref 0–32)
AST: 12 [IU]/L (ref 0–40)
Albumin/Globulin Ratio: 1.7 (ref 1.2–2.2)
Albumin: 3.6 g/dL (ref 3.6–4.6)
Alkaline Phosphatase: 73 [IU]/L (ref 39–117)
BUN/Creatinine Ratio: 16 (ref 12–28)
BUN: 19 mg/dL (ref 8–27)
Bilirubin Total: 0.2 mg/dL (ref 0.0–1.2)
CO2: 23 mmol/L (ref 20–29)
Calcium: 9.3 mg/dL (ref 8.7–10.3)
Chloride: 104 mmol/L (ref 96–106)
Creatinine, Ser: 1.21 mg/dL — ABNORMAL HIGH (ref 0.57–1.00)
GFR calc Af Amer: 48 mL/min/{1.73_m2} — ABNORMAL LOW
GFR calc non Af Amer: 41 mL/min/{1.73_m2} — ABNORMAL LOW
Globulin, Total: 2.1 g/dL (ref 1.5–4.5)
Glucose: 93 mg/dL (ref 65–99)
Potassium: 4.3 mmol/L (ref 3.5–5.2)
Sodium: 139 mmol/L (ref 134–144)
Total Protein: 5.7 g/dL — ABNORMAL LOW (ref 6.0–8.5)

## 2019-10-28 LAB — LIPID PANEL
Chol/HDL Ratio: 3.2 ratio (ref 0.0–4.4)
Cholesterol, Total: 126 mg/dL (ref 100–199)
HDL: 40 mg/dL (ref >39)
LDL Chol Calc (NIH): 61 mg/dL (ref 0–99)
Triglycerides: 144 mg/dL (ref 0–149)
VLDL Cholesterol Cal: 25 mg/dL (ref 5–40)

## 2019-10-28 LAB — HEMOGLOBIN A1C
Est. average glucose Bld gHb Est-mCnc: 114 mg/dL
Hgb A1c MFr Bld: 5.6 % (ref 4.8–5.6)

## 2019-10-28 LAB — VITAMIN D 25 HYDROXY (VIT D DEFICIENCY, FRACTURES): Vit D, 25-Hydroxy: 29.9 ng/mL — ABNORMAL LOW (ref 30.0–100.0)

## 2019-10-28 NOTE — Telephone Encounter (Signed)
Attempted to contact patient, no answer line was busy. Okay for [facility] triage to advise patient.

## 2019-10-28 NOTE — Telephone Encounter (Signed)
-----   Message from Malva Limes, MD sent at 10/28/2019  8:00 AM EDT ----- Is slightly hyPERthyroid. Change levothyroxine t/2 tablet Monday, Wednesday and fridays. And continue 1 full tablet on all other days. A1c is good. She can stay off of metformin. Rest of labs are good.  She has CPE scheduled in may. Will recheck thyroid functions then.

## 2019-10-28 NOTE — Telephone Encounter (Signed)
Pt given lab results and recommendations per notes of Dr. Sherrie Mustache on 10/28/19. Pt verbalized understanding.

## 2019-10-28 NOTE — Telephone Encounter (Signed)
Patient returned call regarding her lab results

## 2019-10-30 ENCOUNTER — Telehealth: Payer: Self-pay | Admitting: Family Medicine

## 2019-10-30 MED ORDER — RIFAXIMIN 550 MG PO TABS: 550.0000 mg | ORAL_TABLET | Freq: Two times a day (BID) | ORAL | 0 refills | Status: AC

## 2019-10-30 NOTE — Telephone Encounter (Signed)
Received call from [facility] that patient called with diarrhea keeping her up all and nothing prescribed my me or GI has been helping. Was diagnosed with IBS by GI. Sent in trial of Xifaxan

## 2019-11-03 ENCOUNTER — Telehealth: Payer: Self-pay | Admitting: Family Medicine

## 2019-11-03 DIAGNOSIS — E039 Hypothyroidism, unspecified: Secondary | ICD-10-CM

## 2019-11-03 NOTE — Telephone Encounter (Signed)
Xifaxin is used to treat diarrhea.  It can rarely cause C Diff though. Recommend holding and getting OV to assess

## 2019-11-03 NOTE — Telephone Encounter (Signed)
Pt is calling to let dr Sherrie Mustache know  xifaxan is causing diarrhea. Pt is having severe diarrhea. Please advice [facility]

## 2019-11-03 NOTE — Telephone Encounter (Signed)
Patient reports that she does not want medical advise from any other doctor in the office. Patient request that we send message to Dr. Sherrie Mustache. Patient upset reports that she has had diarrhea for 17 weeks. And that she is tired of this. Patient requesting that Dr. Sherrie Mustache renew her thyroid medication to get it under control. Patient reports that the uncontrolled thyroid is causing her diarrhea. Patient reports she will not set up another appointment. That she will keep the one scheduled on 11/23/2019.

## 2019-11-03 NOTE — Telephone Encounter (Addendum)
Change levothyroxine to once a day, #30, rf x 2.  Also, make sure she is taking the Lomotil (dphenoxylate-atropine) every day on a schedule. she needs to take at at least twice a day with meals. She can take up to four times a day including one at bedtime if needed.   Be sure to eliminate all dairy products and all fruit juices from diet.

## 2019-11-04 MED ORDER — LEVOTHYROXINE SODIUM 88 MCG PO TABS: 88.0000 ug | ORAL_TABLET | Freq: Every day | ORAL | 2 refills | Status: DC

## 2019-11-04 NOTE — Telephone Encounter (Signed)
Patient advised and verbalized understanding. She agrees to change dose of Levothyroxine. Prescription sent into pharmacy. Patient want to know whether she should stop taking the Xifaxin? She says Dr. B recommend that she put it on hold, but she wants to know what you think.   She also wanted to let Dr. Sherrie Mustache know that if her Diarrhea is not better by her next follow up appointment, she wants to have her thyroid removed. She says she has been dealing with this problem for too long and it is affecting her social life to where she cant go anywhere.

## 2019-11-04 NOTE — Telephone Encounter (Signed)
It usually takes the xifaxan 5-7 days to start helping. I would suggest she continue it until Saturday. If she's not improved by then she can stop it.

## 2019-11-04 NOTE — Telephone Encounter (Signed)
Advised 

## 2019-11-13 ENCOUNTER — Other Ambulatory Visit: Payer: Self-pay | Admitting: Family Medicine

## 2019-11-13 NOTE — Telephone Encounter (Signed)
Requested medication (s) are due for refill today: yes  Requested medication (s) are on the active medication list: no  Last refill:  10/30/19 expired 11/13/19  Future visit scheduled: yes  Notes to clinic:     Requested Prescriptions  Pending Prescriptions Disp Refills   XIFAXAN 550 MG TABS tablet [Pharmacy Med Name: XIFAXAN 550 MG TABLET] 42 tablet 0    Sig: TAKE 1 TABLET BY MOUTH TWICE A DAY FOR 14 DAYS      Off-Protocol Failed - 11/13/2019  9:49 AM      Failed - Medication not assigned to a protocol, review manually.      Passed - Valid encounter within last 12 months    Recent Outpatient Visits           2 weeks ago Type 2 diabetes mellitus with other diabetic kidney complication, without long-term current use of insulin [facility])   [facility] Malva Limes, MD   6 months ago Seasonal allergic rhinitis due to pollen   [facility] Malva Limes, MD   7 months ago Acute maxillary sinusitis, recurrence not specified   [facility] Chrismon, Jodell Cipro, [place]   1 year ago Annual physical exam   [facility] Malva Limes, MD   1 year ago Type 2 diabetes mellitus with other diabetic kidney complication, without long-term current use of insulin [facility])    [facility] Malva Limes, MD       Future Appointments             In 1 month Maximino Green[place], Dolphus Jenny, MD [facility]

## 2019-11-18 ENCOUNTER — Telehealth: Payer: Self-pay | Admitting: Family Medicine

## 2019-11-18 NOTE — Telephone Encounter (Signed)
She can take benadryl for the rash. But why does she think it is the glipizide. She's been taking that since February. If you're going to have a rash from a medication it almost always starts within 2 weeks of starting the medication.

## 2019-11-18 NOTE — Telephone Encounter (Signed)
Pt called ans stated she has broken out all over and has large whelps on her body after taking glipiZIDE (GLUCOTROL XL) 2.5 MG 24 hr tablet  /pt asked for a call today to discuss how to get rid of the rash/ please advise

## 2019-11-19 NOTE — Telephone Encounter (Signed)
Also, please see if she ever tried taking the cholestyramine powder to see if it would help diarrhea and has she been taking to lomotil (diphenoxylate-atropine) on a regular schedule?

## 2019-11-19 NOTE — Telephone Encounter (Signed)
Patient advised. She states she tried the cholestyramine powder and it made the diarrhea. She states she is taking Lomotil regularly. She states she stopped taking the Glipizide to see if the rash improves. She states the pharmacist told her the rash can be a side effect of Glipizide. Patient is scheduled for Tuesday she states she will discuss at that OV.

## 2019-11-22 NOTE — Progress Notes (Signed)
I,[name],acting as a scribe for Lelon Huh, MD.,have documented all relevant documentation on the behalf of Lelon Huh, MD,as directed by  Lelon Huh, MD while in the presence of Lelon Huh, MD. Established patient visit   Patient: Kayla Boyle   DOB: 10-01-1934   85 y.o. Female  MRN: [id number] Visit Date: 11/23/2019  Today's healthcare provider: Lelon Huh, MD   Chief Complaint  Patient presents with  . Hypothyroidism  . Rash   Subjective    HPI Hypothyroid, follow-up  Lab Results  Component Value Date   TSH 0.049 (L) 10/27/2019   TSH 13.300 (H) 09/17/2019   TSH 1.240 05/12/2018   T4TOTAL 8.1 10/17/2016   Wt Readings from Last 3 Encounters:  05/20/17 160 lb (72.6 kg)  04/17/17 161 lb 3.2 oz (73.1 kg)  01/18/16 159 lb (72.1 kg)    She was last seen for hypothyroid 4 weeks ago.  Management since that visit includes reducing Levothyroxine to 74mg daily. She had been on 711m daily at the time TSH was found to be 13.3 in March, afterwhich was increased to 10067maily She reports good compliance with treatment. She is not having side effects.   Symptoms: No change in energy level No constipation Yes diarrhea No heat / cold intolerance Yes nervousness No palpitations No weight changes  ----------------------------------------------------------------------------------------- Follow up for Irritable bowel syndrome:  The patient was last seen for this 4 weeks ago. Changes made at last visit include trying Bentyl. Patient was later changed to Xifaxan.  She reports good compliance with treatment. She feels that condition is Unchanged. She feels that no prescribed medications have helped to resolve the diarrhea.  She described the diarrhea as a watery stool that flares up after eating. She denies any blood in her stool. She was unable to take Cholestyramine because it worsened the diarrhea. She states she took Cholestyramine for over a  week. She has been taking Lomotil, which helps to solidify her stools.   -----------------------------------------------------------------------------------------  Rash: Patient complains of a red itchy rash on her arms, scalp, legs and back. Rash appeared 5 months ago and seems to be worsening. She has been using Benadryl to help with rash, but hasn't seen any improvement. She stopped taking Glipizide 1 week ago.     Medications: Outpatient Medications Prior to Visit  Medication Sig  . albuterol (VENTOLIN HFA) 108 (90 Base) MCG/ACT inhaler Inhale 2 puffs into the lungs every 6 (six) hours as needed for wheezing or shortness of breath.  . aMarland Kitchenpirin 81 MG tablet Take 1 tablet by mouth daily.  . benzonatate (TESSALON) 100 MG capsule Take 1 capsule (100 mg total) by mouth 2 (two) times daily as needed for cough.  . Blood Glucose Monitoring Suppl (ACCU-CHEK COMPACT CARE KIT) KIT As directed  . cholestyramine (QUESTRAN) 4 g packet Take 1 packet (4 g total) by mouth 2 (two) times daily.  . dMarland Kitchenxlansoprazole (DEXILANT) 60 MG capsule Take 1 capsule (60 mg total) by mouth daily. Take in place of Nexium  . dicyclomine (BENTYL) 10 MG capsule Take 1 capsule (10 mg total) by mouth 4 (four) times daily -  before meals and at bedtime. For colitis  . diphenoxylate-atropine (LOMOTIL) 2.5-0.025 MG tablet Take 1 tablet by mouth 4 (four) times daily as needed for diarrhea or loose stools.  . eMarland Kitchenomeprazole (NEXIUM) 40 MG capsule Take 1 capsule (40 mg total) by mouth daily.  . furosemide (LASIX) 20 MG tablet Take 1 tablet (20 mg  total) by mouth daily as needed (swelling).  . gabapentin (NEURONTIN) 300 MG capsule Take 1 capsule (300 mg total) by mouth 3 (three) times daily.  Marland Kitchen glucose blood (ACCU-CHEK AVIVA PLUS) test strip Use to check blood sugar once a day  . levothyroxine (SYNTHROID) 88 MCG tablet Take 1 tablet (88 mcg total) by mouth daily.  Marland Kitchen lisinopril (ZESTRIL) 5 MG tablet TAKE 1 TABLET BY MOUTH DAILY  .  lovastatin (MEVACOR) 40 MG tablet TAKE 1 TABLET BY MOUTH AT BEDTIME  . methocarbamol (ROBAXIN) 500 MG tablet TAKE TWO TABLETS BY MOUTH EVERY 6 HOURS AS NEEDED FOR MUSCLE SPASMS  . montelukast (SINGULAIR) 10 MG tablet Take 1 tablet (10 mg total) by mouth daily.  . psyllium (METAMUCIL) 28 % packet Take 1 packet by mouth daily.  . risedronate (ACTONEL) 35 MG tablet TAKE 1 TABLET BY MOUTH ONCE A WEEK WITH A FULL GLASS OF WATER AND DO NOT LIE BACK DOWN FOR 30 MINUTES AFTER.  . traMADol (ULTRAM) 50 MG tablet Take 1-2 tablets (50-100 mg total) by mouth every 8 (eight) hours as needed.  . vitamin B-12 (CYANOCOBALAMIN) 100 MCG tablet Take 800 mcg by mouth daily.   . Vitamin D, Cholecalciferol, 400 UNITS CHEW Chew 1 tablet by mouth daily.  Marland Kitchen glipiZIDE (GLUCOTROL XL) 2.5 MG 24 hr tablet Take 1 tablet (2.5 mg total) by mouth daily with breakfast. (Patient not taking: Reported on 11/23/2019)  . loperamide (IMODIUM) 2 MG capsule Take 1 capsule (2 mg total) by mouth 4 (four) times daily as needed for up to 30 doses for diarrhea or loose stools. (Patient not taking: Reported on 11/23/2019)   No facility-administered medications prior to visit.    Review of Systems See HPI   Objective    BP 120/60 (BP Location: Left Arm, Patient Position: Sitting, Cuff Size: Large)   Pulse 89   Temp 97.9 F (36.6 C) (Temporal)   Resp 18   SpO2 96% Comment: room air   Physical Exam     General: Appearance:    Well developed, well nourished female in no acute distress  Eyes:    PERRL, conjunctiva/corneas clear, EOM's intact       Lungs:     Clear to auscultation bilaterally, respirations unlabored  Heart:    Normal heart rate. Normal rhythm. No murmurs, rubs, or gallops.   MS:   Below knee amputation of right lower extremity is noted.   Neurologic:   Awake, alert, oriented x 3. No apparent focal neurological           defect.   Skin:  Scattered faint red circular skin discolorations about 2-2.5 cm diameter across both  UEs back, neck and abdomen.      Assessment & Plan     1. Diarrhea, unspecified type No improvement with treatment for hyPOthyroid as below. No improvement with Xifaxin or cholestyramine. Is no down from 5-7  Watery stools to 2-3 water stools daily since getting on scheduled doses of lomotil.  Unclear if rash is related to GI issues. She does have follow up with GI in early June. Check additional labs. Consider trial of Vyberzi.   - Tissue transglutaminase, IgA - IgA - Alpha-1-antitrypsin - Eosinophil count  2. Hypothyroidism, unspecified type Has gone from hyPO to hyPER thyroid when increase from 75 to 11mg, now down to 88. Reassess thyroid functions today.  - T4 AND TSH  3. Rash She now reports rash has been present for several months. Has been off glipizide  about a week with no change, but her last A1c was 5.6, so will keep glipizide on hold for the time being.  For itching will prescription - hydrOXYzine (ATARAX/VISTARIL) 25 MG tablet; Take 1 tablet (25 mg total) by mouth 3 (three) times daily as needed for itching.  Dispense: 30 tablet; Refill: 0     4. Type 2 diabetes mellitus with other diabetic kidney complication, without long-term current use of insulin (HCC) Very well controlled, keep off of glipizide as above.   5. Positive colorectal cancer screening using Cologuard test May be due to rectal irritation from persistent diarrhea. She does have follow up with GI in a few weeks to    The entirety of the information documented in the History of Present Illness, Review of Systems and Physical Exam were personally obtained by me. Portions of this information were initially documented by the CMA and reviewed by me for thoroughness and accuracy.      Lelon Huh, MD  [facility] [phone] (phone) [phone] (fax)  [place]

## 2019-11-23 ENCOUNTER — Ambulatory Visit (INDEPENDENT_AMBULATORY_CARE_PROVIDER_SITE_OTHER): Payer: PPO | Admitting: Family Medicine

## 2019-11-23 ENCOUNTER — Other Ambulatory Visit: Payer: Self-pay

## 2019-11-23 ENCOUNTER — Encounter: Payer: Self-pay | Admitting: Family Medicine

## 2019-11-23 VITALS — BP 120/60 | HR 89 | Temp 97.9°F | Resp 18

## 2019-11-23 DIAGNOSIS — E1129 Type 2 diabetes mellitus with other diabetic kidney complication: Secondary | ICD-10-CM | POA: Diagnosis not present

## 2019-11-23 DIAGNOSIS — R195 Other fecal abnormalities: Secondary | ICD-10-CM

## 2019-11-23 DIAGNOSIS — R197 Diarrhea, unspecified: Secondary | ICD-10-CM | POA: Diagnosis not present

## 2019-11-23 DIAGNOSIS — R21 Rash and other nonspecific skin eruption: Secondary | ICD-10-CM | POA: Diagnosis not present

## 2019-11-23 DIAGNOSIS — E039 Hypothyroidism, unspecified: Secondary | ICD-10-CM | POA: Diagnosis not present

## 2019-11-23 MED ORDER — HYDROXYZINE HCL 25 MG PO TABS: 25.0000 mg | ORAL_TABLET | Freq: Three times a day (TID) | ORAL | 0 refills | Status: DC | PRN

## 2019-11-24 ENCOUNTER — Telehealth: Payer: Self-pay

## 2019-11-24 DIAGNOSIS — E039 Hypothyroidism, unspecified: Secondary | ICD-10-CM

## 2019-11-24 MED ORDER — LEVOTHYROXINE SODIUM 50 MCG PO TABS: 50.0000 ug | ORAL_TABLET | Freq: Every day | ORAL | 1 refills | Status: DC

## 2019-11-24 NOTE — Telephone Encounter (Signed)
-----   Message from Malva Limes, MD sent at 11/24/2019  8:17 AM EDT ----- She is now hyPER thyroid. Need to reduce levothyroxine to daily, #30, rf x 1. Other labs are still pending.

## 2019-11-24 NOTE — Telephone Encounter (Signed)
Patient advised and agrees with reducing the dose of Levothyroxine. New prescription sent into pharmacy.

## 2019-11-25 ENCOUNTER — Ambulatory Visit: Payer: PPO | Admitting: Gastroenterology

## 2019-11-25 LAB — IGA: IgA/Immunoglobulin A, Serum: 127 mg/dL (ref 64–422)

## 2019-11-25 LAB — TISSUE TRANSGLUTAMINASE, IGA: Transglutaminase IgA: <2 U/mL (ref 0–3)

## 2019-11-25 LAB — T4 AND TSH
T4, Total: 14.7 ug/dL — ABNORMAL HIGH (ref 4.5–12.0)
TSH: 0.006 u[IU]/mL — ABNORMAL LOW (ref 0.450–4.500)

## 2019-11-25 LAB — EOSINOPHIL COUNT: EOS (ABSOLUTE): 0.5 10*3/uL — ABNORMAL HIGH (ref 0.0–0.4)

## 2019-11-25 LAB — ALPHA-1-ANTITRYPSIN: A-1 Antitrypsin: 204 mg/dL — ABNORMAL HIGH (ref 101–187)

## 2019-11-29 ENCOUNTER — Telehealth: Payer: Self-pay | Admitting: Family Medicine

## 2019-11-29 NOTE — Telephone Encounter (Signed)
1.  Pt wants to know if the other labs are back?  2.  Pt wants is to know she is still itching and she is breaking out in whelps, breaking out on hands and fingers.  Pt states the hydrOXYzine (ATARAX/VISTARIL) 25 MG tablet is  NOT doing a thing!  Pt states she needs something to take this away asap!    [facility] [place], [place] [address] Phone:  [phone]  Fax:  [phone]

## 2019-11-30 NOTE — Telephone Encounter (Signed)
Patient returned call and was read lab note by Dr Sherrie Mustache.  She will need prednisone sent to [facility].. She is requesting call back and advice on when she is to have her thyroid and A1C rechecked. She will carefully read labels for medications.

## 2019-11-30 NOTE — Telephone Encounter (Signed)
Pt calling again this am and would appreciate Dr Sherrie Mustache addressing her issues asap.  Pt would like to know labs, and get Rx to pharmacy for the itching.  Please call .Pt is truly concerned she will not be well on Sunday for the memorial program if she continues to itch like she has.Kayla Boyle

## 2019-11-30 NOTE — Telephone Encounter (Signed)
Tried calling patient. Left message to call back. OK for [facility] Triage to speak with patient and route message back to office with patient's response.

## 2019-11-30 NOTE — Telephone Encounter (Signed)
Labs are all normal. Recommend course of prednisone for itching. Start 20mg  one tablet twice daily for 7 days.  Also, please have her double check the dose of levothyroxine that she is taking. Look at all her bottles to make sure she is not inadvertently taking two different pills. Her thyroid levels were much higher that I would expect from the dose that we prescribed.

## 2019-12-01 ENCOUNTER — Ambulatory Visit: Payer: PPO | Admitting: Gastroenterology

## 2019-12-01 MED ORDER — PREDNISONE 10 MG PO TABS
10.0000 mg | ORAL_TABLET | Freq: Two times a day (BID) | ORAL | 0 refills | Status: DC
Start: 2019-12-01 — End: 2019-11-01

## 2019-12-01 NOTE — Telephone Encounter (Signed)
RX sent please advise when to have labs rechecked.

## 2019-12-01 NOTE — Telephone Encounter (Signed)
She needs to TSH and t4 checked in 4 weeks.

## 2019-12-01 NOTE — Telephone Encounter (Signed)
Patient advised. She wants to come in for an appointment to talk with Dr. Sherrie Mustache around that time so I scheduled a follow up appointment on 12/31/2019 at 8:20am.

## 2019-12-07 ENCOUNTER — Telehealth: Payer: Self-pay | Admitting: Family Medicine

## 2019-12-07 ENCOUNTER — Other Ambulatory Visit: Payer: Self-pay | Admitting: Family Medicine

## 2019-12-07 DIAGNOSIS — R21 Rash and other nonspecific skin eruption: Secondary | ICD-10-CM

## 2019-12-07 DIAGNOSIS — T7840XA Allergy, unspecified, initial encounter: Secondary | ICD-10-CM

## 2019-12-07 NOTE — Telephone Encounter (Signed)
Pt is calling and has 2 more days of prednisone left. Pt is still itching/rash  all over. Pt states the prednisone is not help. [facility] [place]

## 2019-12-07 NOTE — Telephone Encounter (Signed)
Patient advised and verbalized understanding. Patient agrees to try putting medication on hold as described below. She has a follow up appointment scheduled for 12/31/2019 and wants to have the RAST test done at that time.

## 2019-12-07 NOTE — Telephone Encounter (Signed)
There are not any other medications to help with itching. She will need to try putting different medications on hold to see if one of them is causing the rash. I would try putting stopping lisinopril for 5 days. If rash doesn't get better then try putting lovastatin on hold for 5 days, if it still doesn't get better, try putting tramadol on hold for 5 days. Let me know if known of them make a difference.  We could also run a RAST test to see if she has any food allergies. This is a blood test. Let me know if she wants to do than.

## 2019-12-07 NOTE — Telephone Encounter (Signed)
Requested medication (s) are due for refill today: no  Requested medication (s) are on the active medication list: yes  Last refill:  10/01/2018  Future visit scheduled: yes  Notes to clinic:  review for use last refilled a year ago   Requested Prescriptions  Pending Prescriptions Disp Refills   benzonatate (TESSALON) 100 MG capsule [Pharmacy Med Name: BENZONATATE 100 MG CAP] 20 capsule 2    Sig: TAKE 1 CAPSULE BY MOUTH TWICE A DAY AS NEEDED FOR COUGH      Ear, Nose, and Throat:  Antitussives/Expectorants Passed - 12/07/2019  8:28 AM      Passed - Valid encounter within last 12 months    Recent Outpatient Visits           2 weeks ago Diarrhea, unspecified type   [facility] Malva Limes, MD   1 month ago Type 2 diabetes mellitus with other diabetic kidney complication, without long-term current use of insulin (HCC)   [facility] Malva Limes, MD   7 months ago Seasonal allergic rhinitis due to pollen   [facility] Malva Limes, MD   8 months ago Acute maxillary sinusitis, recurrence not specified   [facility] Chrismon, Jodell [place], [place]   1 year ago Annual physical exam   [facility] Malva Limes, MD       Future Appointments             In 6 days Pasty Spillers, MD [facility]   In 3 weeks Fisher, Demetrios Isaacs, MD [facility], [facility]

## 2019-12-13 ENCOUNTER — Encounter: Payer: Self-pay | Admitting: Gastroenterology

## 2019-12-13 ENCOUNTER — Other Ambulatory Visit: Payer: Self-pay

## 2019-12-13 ENCOUNTER — Other Ambulatory Visit: Payer: Self-pay | Admitting: Family Medicine

## 2019-12-13 ENCOUNTER — Telehealth: Payer: Self-pay

## 2019-12-13 ENCOUNTER — Ambulatory Visit: Payer: PPO | Admitting: Gastroenterology

## 2019-12-13 VITALS — BP 152/73 | HR 78 | Temp 97.3°F

## 2019-12-13 DIAGNOSIS — R21 Rash and other nonspecific skin eruption: Secondary | ICD-10-CM

## 2019-12-13 DIAGNOSIS — E039 Hypothyroidism, unspecified: Secondary | ICD-10-CM

## 2019-12-13 MED ORDER — LEVOTHYROXINE SODIUM 50 MCG PO TABS: 50.0000 ug | ORAL_TABLET | Freq: Every day | ORAL | 0 refills | Status: DC

## 2019-12-13 NOTE — Patient Instructions (Signed)
We will send your referral to [facility] and they will call you to schedule your appointment.

## 2019-12-13 NOTE — Telephone Encounter (Signed)
Referral was faxed to [facility] and they stated that they will contact patient with appointment date and time.

## 2019-12-13 NOTE — Telephone Encounter (Incomplete)
PT stated that she needs a refill on her synthroid 69 mg.PT stated she only has 15 left and that it will not last her until her next appt on 12/31/2019

## 2019-12-13 NOTE — Progress Notes (Signed)
Vonda Antigua, MD [address]  [place]  [place], [place] [address]  Main: [phone]  Fax: [phone]   Primary Care Physician: Birdie Sons, MD   Chief Complaint  Patient presents with  . Diarrhea    Patient stated that her diarrhea is resolved.    HPI: FUSAYE WACHTEL is a 85 y.o. female here for follow-up of diarrhea.  Diarrhea has resolved completely.  She is now reporting a new rash that is going on for a month.  She states she is working with her primary care provider and they have changed her medications but the rash continues.  No shortness of breath.  No abdominal pain.  No nausea or vomiting.  Current Outpatient Medications  Medication Sig Dispense Refill  . aspirin 81 MG tablet Take 1 tablet by mouth daily.    Marland Kitchen levothyroxine (SYNTHROID) 50 MCG tablet Take 1 tablet (50 mcg total) by mouth daily. 30 tablet 0  . lovastatin (MEVACOR) 40 MG tablet TAKE 1 TABLET BY MOUTH AT BEDTIME 90 tablet 4  . albuterol (VENTOLIN HFA) 108 (90 Base) MCG/ACT inhaler Inhale 2 puffs into the lungs every 6 (six) hours as needed for wheezing or shortness of breath. (Patient not taking: Reported on 12/13/2019) 54 g 1  . benzonatate (TESSALON) 100 MG capsule TAKE 1 CAPSULE BY MOUTH TWICE A DAY AS NEEDED FOR COUGH (Patient not taking: Reported on 12/13/2019) 20 capsule 2  . Blood Glucose Monitoring Suppl (ACCU-CHEK COMPACT CARE KIT) KIT As directed (Patient not taking: Reported on 12/13/2019) 1 each 0  . dexlansoprazole (DEXILANT) 60 MG capsule Take 1 capsule (60 mg total) by mouth daily. Take in place of Nexium (Patient not taking: Reported on 12/13/2019) 90 capsule 3  . dicyclomine (BENTYL) 10 MG capsule Take 1 capsule (10 mg total) by mouth 4 (four) times daily -  before meals and at bedtime. For colitis (Patient not taking: Reported on 12/13/2019) 90 capsule 3  . diphenoxylate-atropine (LOMOTIL) 2.5-0.025 MG tablet Take 1 tablet by mouth 4 (four) times daily as needed for diarrhea or  loose stools. (Patient not taking: Reported on 12/13/2019) 180 tablet 1  . esomeprazole (NEXIUM) 40 MG capsule Take 1 capsule (40 mg total) by mouth daily. (Patient not taking: Reported on 12/13/2019) 30 capsule 1  . furosemide (LASIX) 20 MG tablet Take 1 tablet (20 mg total) by mouth daily as needed (swelling). (Patient not taking: Reported on 12/13/2019) 90 tablet 3  . gabapentin (NEURONTIN) 300 MG capsule Take 1 capsule (300 mg total) by mouth 3 (three) times daily. (Patient not taking: Reported on 12/13/2019) 90 capsule 3  . glipiZIDE (GLUCOTROL XL) 2.5 MG 24 hr tablet Take 1 tablet (2.5 mg total) by mouth daily with breakfast. (Patient not taking: Reported on 11/23/2019) 30 tablet 5  . glucose blood (ACCU-CHEK AVIVA PLUS) test strip Use to check blood sugar once a day (Patient not taking: Reported on 12/13/2019) 100 each 4  . hydrOXYzine (ATARAX/VISTARIL) 25 MG tablet Take 1 tablet (25 mg total) by mouth 3 (three) times daily as needed for itching. (Patient not taking: Reported on 12/13/2019) 30 tablet 0  . lisinopril (ZESTRIL) 5 MG tablet TAKE 1 TABLET BY MOUTH DAILY (Patient not taking: Reported on 12/13/2019) 90 tablet 4  . loperamide (IMODIUM) 2 MG capsule Take 1 capsule (2 mg total) by mouth 4 (four) times daily as needed for up to 30 doses for diarrhea or loose stools. (Patient not taking: Reported on 12/13/2019) 30 capsule  0  . methocarbamol (ROBAXIN) 500 MG tablet TAKE TWO TABLETS BY MOUTH EVERY 6 HOURS AS NEEDED FOR MUSCLE SPASMS (Patient not taking: Reported on 12/13/2019) 60 tablet 3  . montelukast (SINGULAIR) 10 MG tablet Take 1 tablet (10 mg total) by mouth daily. (Patient not taking: Reported on 12/13/2019) 90 tablet 4  . predniSONE (DELTASONE) 10 MG tablet Take 1 tablet (10 mg total) by mouth 2 (two) times daily with a meal. (Patient not taking: Reported on 12/13/2019) 14 tablet 0  . risedronate (ACTONEL) 35 MG tablet TAKE 1 TABLET BY MOUTH ONCE A WEEK WITH A FULL GLASS OF WATER AND DO NOT LIE BACK DOWN FOR  30 MINUTES AFTER. (Patient not taking: Reported on 12/13/2019) 12 tablet 4  . traMADol (ULTRAM) 50 MG tablet Take 1-2 tablets (50-100 mg total) by mouth every 8 (eight) hours as needed. (Patient not taking: Reported on 12/13/2019) 90 tablet 4  . vitamin B-12 (CYANOCOBALAMIN) 100 MCG tablet Take 800 mcg by mouth daily.     . Vitamin D, Cholecalciferol, 400 UNITS CHEW Chew 1 tablet by mouth daily.     No current facility-administered medications for this visit.    Allergies as of 12/13/2019 - Review Complete 12/13/2019  Allergen Reaction Noted  . Fosamax [alendronate sodium] Rash 11/11/2017  . Bee venom Hives 01/02/2015  . Codeine Rash and Swelling 11/24/2014  . Penicillins Rash and Swelling 11/24/2014    ROS:  General: Negative for anorexia, weight loss, fever, chills, fatigue, weakness. ENT: Negative for hoarseness, difficulty swallowing , nasal congestion. CV: Negative for chest pain, angina, palpitations, dyspnea on exertion, peripheral edema.  Respiratory: Negative for dyspnea at rest, dyspnea on exertion, cough, sputum, wheezing.  GI: See history of present illness. GU:  Negative for dysuria, hematuria, urinary incontinence, urinary frequency, nocturnal urination.  Endo: Negative for unusual weight change.    Physical Examination:   BP (!) 152/73   Pulse 78   Temp (!) 97.3 F (36.3 C) (Oral)   General: Well-nourished, well-developed in no acute distress.  Eyes: No icterus. Conjunctivae pink. Mouth: Oropharyngeal mucosa moist and pink , no lesions erythema or exudate. Neck: Supple, Trachea midline Abdomen: Bowel sounds are normal, nontender, nondistended, no hepatosplenomegaly or masses, no abdominal bruits or hernia , no rebound or guarding.   Extremities: No lower extremity edema. No clubbing or deformities. Neuro: Alert and oriented x 3.  Grossly intact. Skin: Warm and dry, no jaundice.  Small, pink lesions on arms and legs.  Nontender.  Nonerythematous.  No pus or swelling  or discharge Psych: Alert and cooperative, normal mood and affect.   Labs: CMP     Component Value Date/Time   NA 139 10/27/2019 0940   K 4.3 10/27/2019 0940   CL 104 10/27/2019 0940   CO2 23 10/27/2019 0940   GLUCOSE 93 10/27/2019 0940   BUN 19 10/27/2019 0940   CREATININE 1.21 (H) 10/27/2019 0940   CALCIUM 9.3 10/27/2019 0940   PROT 5.7 (L) 10/27/2019 0940   ALBUMIN 3.6 10/27/2019 0940   AST 12 10/27/2019 0940   ALT 8 10/27/2019 0940   ALKPHOS 73 10/27/2019 0940   BILITOT 0.2 10/27/2019 0940   GFRNONAA 41 (L) 10/27/2019 0940   GFRAA 48 (L) 10/27/2019 0940   Lab Results  Component Value Date   WBC 8.2 10/27/2019   HGB 10.9 (L) 10/27/2019   HCT 33.0 (L) 10/27/2019   MCV 91 10/27/2019   PLT 319 10/27/2019    Imaging Studies: No results found.  Assessment and Plan:   SHANIECE BUSSA is a 85 y.o. y/o female here for follow-up of diarrhea which has now resolved  Continue follow-up with PCP in regard to the new rash.  However, she is requesting dermatology referral.  Will place.  If diarrhea reoccurs she was advised to contact us and she verbalized understanding.  She has stopped her esomeprazole 3 days ago and heartburn has not reoccurred.  She was advised to continue to hold her medication.  However she has been on this for a long time do not suspect that this is causing her rash.  Dr Vonda Antigua

## 2019-12-14 DIAGNOSIS — Z01818 Encounter for other preprocedural examination: Secondary | ICD-10-CM | POA: Diagnosis not present

## 2019-12-14 DIAGNOSIS — J449 Chronic obstructive pulmonary disease, unspecified: Secondary | ICD-10-CM | POA: Diagnosis not present

## 2019-12-29 NOTE — Progress Notes (Signed)
Established patient visit   Patient: Kayla Boyle   DOB: 05/29/1935   85 y.o. Female  MRN: [id number] Visit Date: 12/31/2019  Today's healthcare provider: Lelon Huh, MD   Chief Complaint  Patient presents with  . Hypothyroidism  . Rash   I,Latasha Walston,acting as a scribe for Lelon Huh, MD.,have documented all relevant documentation on the behalf of Lelon Huh, MD,as directed by  Lelon Huh, MD while in the presence of Lelon Huh, MD.  Subjective    HPI Hypothyroid, follow-up  Lab Results  Component Value Date   TSH 0.006 (L) 11/23/2019   TSH 0.049 (L) 10/27/2019   TSH 13.300 (H) 09/17/2019   T4TOTAL 14.7 (H) 11/23/2019   T4TOTAL 8.1 10/17/2016   Wt Readings from Last 3 Encounters:  05/20/17 160 lb (72.6 kg)  04/17/17 161 lb 3.2 oz (73.1 kg)  01/18/16 159 lb (72.1 kg)    She was last seen for hypothyroid 1 months ago.  Management since that visit includes reducing Levothyroxine to 46mg daily. She reports poor compliance with treatment. Patient states she stopped taking 50 mcg and started back taking 75 mcg due to a rash. She is not having side effects.   Symptoms: No change in energy level No constipation  No diarrhea No heat / cold intolerance  No nervousness No palpitations  No weight changes    -----------------------------------------------------------------------------------------  Follow up for Itching/ Rash:  The patient was last seen for this 1 months ago. Changes made at last visit include prescribing Hydroxyzine and advising patient to keep Glipizide on hold. Patient was later started on a 7 day course of prednisone 251mtwice daily for itching. On 12/07/2019 patient called reporting that her itching and rash was not any better. Patient was advised to try putting different medications on hold to see if one of them was causing the rash. She was to try stopping lisinopril for 5 days. If rash doesn't get better then try putting  lovastatin on hold for 5 days, if it still didn't get better, try putting tramadol on hold for 5 days. If no improvement after these recommendations, then we could order a RAST test to see if she has any food allergies.   She reports good compliance with treatment. She feels that condition is Unchanged. She is not having side effects.   ----------------------------------------------------------------------------------------- Diarrhea  Patient had been having persistent diarrhea for several months and was on cholestyramine, dicyclomine, and lomotil. She had several medications discontinue including risedronate and metformin. She had two visits to GI, but diarrhea had finally resolve by the time she had her follow up Dr. TaBonna GainsAbout 3 weeks ago. She has restarted most of medications including the metformin, but not alendronate and diarrhea has not returned. However, she is still of alendronate.      Medications: Outpatient Medications Prior to Visit  Medication Sig  . albuterol (VENTOLIN HFA) 108 (90 Base) MCG/ACT inhaler Inhale 2 puffs into the lungs every 6 (six) hours as needed for wheezing or shortness of breath.  . Marland Kitchenspirin 81 MG tablet Take 1 tablet by mouth daily.  . benzonatate (TESSALON) 100 MG capsule TAKE 1 CAPSULE BY MOUTH TWICE A DAY AS NEEDED FOR COUGH  . Blood Glucose Monitoring Suppl (ACCU-CHEK COMPACT CARE KIT) KIT As directed  . furosemide (LASIX) 20 MG tablet Take 1 tablet (20 mg total) by mouth daily as needed (swelling).  . gabapentin (NEURONTIN) 300 MG capsule Take 1 capsule (300 mg total) by mouth  3 (three) times daily.  Marland Kitchen glucose blood (ACCU-CHEK AVIVA PLUS) test strip Use to check blood sugar once a day  . lovastatin (MEVACOR) 40 MG tablet TAKE 1 TABLET BY MOUTH AT BEDTIME  . methocarbamol (ROBAXIN) 500 MG tablet TAKE TWO TABLETS BY MOUTH EVERY 6 HOURS AS NEEDED FOR MUSCLE SPASMS  . montelukast (SINGULAIR) 10 MG tablet Take 1 tablet (10 mg total) by mouth daily.  .  risedronate (ACTONEL) 35 MG tablet TAKE 1 TABLET BY MOUTH ONCE A WEEK WITH A FULL GLASS OF WATER AND DO NOT LIE BACK DOWN FOR 30 MINUTES AFTER.  . traMADol (ULTRAM) 50 MG tablet Take 1-2 tablets (50-100 mg total) by mouth every 8 (eight) hours as needed.  . vitamin B-12 (CYANOCOBALAMIN) 100 MCG tablet Take 800 mcg by mouth daily.   . Vitamin D, Cholecalciferol, 400 UNITS CHEW Chew 1 tablet by mouth daily.  Marland Kitchen dexlansoprazole (DEXILANT) 60 MG capsule Take 1 capsule (60 mg total) by mouth daily. Take in place of Nexium (Patient not taking: Reported on 12/13/2019)  . dicyclomine (BENTYL) 10 MG capsule Take 1 capsule (10 mg total) by mouth 4 (four) times daily -  before meals and at bedtime. For colitis (Patient not taking: Reported on 12/13/2019)  . diphenoxylate-atropine (LOMOTIL) 2.5-0.025 MG tablet Take 1 tablet by mouth 4 (four) times daily as needed for diarrhea or loose stools. (Patient not taking: Reported on 12/13/2019)  . esomeprazole (NEXIUM) 40 MG capsule Take 1 capsule (40 mg total) by mouth daily. (Patient not taking: Reported on 12/13/2019)  . glipiZIDE (GLUCOTROL XL) 2.5 MG 24 hr tablet Take 1 tablet (2.5 mg total) by mouth daily with breakfast. (Patient not taking: Reported on 11/23/2019)  . hydrOXYzine (ATARAX/VISTARIL) 25 MG tablet Take 1 tablet (25 mg total) by mouth 3 (three) times daily as needed for itching. (Patient not taking: Reported on 12/13/2019)  . levothyroxine (SYNTHROID) 50 MCG tablet Take 1 tablet (50 mcg total) by mouth daily. (Patient not taking: Reported on 12/31/2019)  . lisinopril (ZESTRIL) 5 MG tablet TAKE 1 TABLET BY MOUTH DAILY (Patient not taking: Reported on 12/13/2019)  . loperamide (IMODIUM) 2 MG capsule Take 1 capsule (2 mg total) by mouth 4 (four) times daily as needed for up to 30 doses for diarrhea or loose stools. (Patient not taking: Reported on 12/13/2019)  . predniSONE (DELTASONE) 10 MG tablet Take 1 tablet (10 mg total) by mouth 2 (two) times daily with a meal. (Patient  not taking: Reported on 12/13/2019)   No facility-administered medications prior to visit.    Review of Systems  Constitutional: Negative for appetite change, chills, fatigue and fever.  Respiratory: Negative for chest tightness and shortness of breath.   Cardiovascular: Negative for chest pain and palpitations.  Gastrointestinal: Negative for abdominal pain, nausea and vomiting.  Neurological: Negative for dizziness and weakness.      Objective    BP 136/83 (BP Location: Right Arm, Patient Position: Sitting, Cuff Size: Normal)   Pulse 62   Temp (!) 97.3 F (36.3 C) (Temporal)    Physical Exam   General: Appearance:    Well developed, well nourished female in no acute distress  Eyes:    PERRL, conjunctiva/corneas clear, EOM's intact       Lungs:     Clear to auscultation bilaterally, respirations unlabored  Heart:    Normal heart rate. Normal rhythm. No murmurs, rubs, or gallops.   MS:   Below knee amputation of right lower extremity is noted.  Skin:   Scattered small erythematous macules both arms, both legs, and trunk with a few papular lesions, a few lesions that appear to be burrows.      No results found for any visits on 12/31/19.  Assessment & Plan     1. Urticaria No improvement when all of medications were held, and has since restarted most of them. No improvement with oral or topical steroids, or oral hydroxyzine. She does have appt scheduled with dermatology. A few lesions are suspicious for scabies. She wants to be checked for allergies.  - Perennial allergen profile IgE - Food Allergy Profile Consider topical antiparasitic.  2. Hypothyroidism, unspecified type  - T4 AND TSH  3. Diarrhea, unspecified type No resolved. Etiology unclear.       The entirety of the information documented in the History of Present Illness, Review of Systems and Physical Exam were personally obtained by me. Portions of this information were initially documented by the CMA and  reviewed by me for thoroughness and accuracy.      Lelon Huh, MD  [facility] [phone] (phone) [phone] (fax)  [place]

## 2019-12-31 ENCOUNTER — Other Ambulatory Visit: Payer: Self-pay | Admitting: Family Medicine

## 2019-12-31 ENCOUNTER — Ambulatory Visit (INDEPENDENT_AMBULATORY_CARE_PROVIDER_SITE_OTHER): Payer: PPO | Admitting: Family Medicine

## 2019-12-31 ENCOUNTER — Other Ambulatory Visit: Payer: Self-pay

## 2019-12-31 ENCOUNTER — Encounter: Payer: Self-pay | Admitting: Family Medicine

## 2019-12-31 VITALS — BP 136/83 | HR 62 | Temp 97.3°F

## 2019-12-31 DIAGNOSIS — E039 Hypothyroidism, unspecified: Secondary | ICD-10-CM | POA: Diagnosis not present

## 2019-12-31 DIAGNOSIS — L509 Urticaria, unspecified: Secondary | ICD-10-CM

## 2019-12-31 DIAGNOSIS — R197 Diarrhea, unspecified: Secondary | ICD-10-CM | POA: Diagnosis not present

## 2019-12-31 MED ORDER — ATENOLOL-CHLORTHALIDONE 50-25 MG PO TABS: ORAL_TABLET | ORAL | 1 refills | Status: DC

## 2019-12-31 MED ORDER — METFORMIN HCL 500 MG PO TABS: 500.0000 mg | ORAL_TABLET | Freq: Every day | ORAL | 4 refills | Status: DC

## 2019-12-31 MED ORDER — ESOMEPRAZOLE MAGNESIUM 40 MG PO CPDR: 40.0000 mg | DELAYED_RELEASE_CAPSULE | Freq: Every day | ORAL | 1 refills | Status: DC

## 2020-01-06 ENCOUNTER — Other Ambulatory Visit: Payer: Self-pay | Admitting: Family Medicine

## 2020-01-06 ENCOUNTER — Telehealth: Payer: Self-pay

## 2020-01-06 LAB — ALLERGEN PROFILE, PERENNIAL ALLERGEN IGE

## 2020-01-06 LAB — FOOD ALLERGY PROFILE
Allergen Corn, IgE: <0.1 kU/L
Clam IgE: <0.1 kU/L
Codfish IgE: <0.1 kU/L
Egg White IgE: <0.1 kU/L
Milk IgE: <0.1 kU/L
Peanut IgE: <0.1 kU/L
Scallop IgE: <0.1 kU/L
Sesame Seed IgE: <0.1 kU/L
Shrimp IgE: <0.1 kU/L
Soybean IgE: <0.1 kU/L
Walnut IgE: <0.1 kU/L
Wheat IgE: <0.1 kU/L

## 2020-01-06 LAB — T4 AND TSH
T4, Total: 7.3 ug/dL (ref 4.5–12.0)
TSH: 0.53 u[IU]/mL (ref 0.450–4.500)

## 2020-01-06 MED ORDER — PERMETHRIN 5 % EX CREA: TOPICAL_CREAM | CUTANEOUS | 1 refills | Status: DC

## 2020-01-06 NOTE — Telephone Encounter (Signed)
-----   Message from Malva Limes, MD sent at 01/06/2020  2:00 PM EDT ----- Thyroid functions are normal. The allergy tests are all negative.  Recommend course of Permethrin cream to for suspected mites. Please send prescription to pharmacy of her choice.

## 2020-01-06 NOTE — Telephone Encounter (Signed)
Patient advised.

## 2020-01-06 NOTE — Telephone Encounter (Signed)
Copied from CRM [id number]. Topic: General - Inquiry >> Jan 06, 2020 12:45 PM Daphine Deutscher D wrote: Reason for CRM: Pt called asking if her thyroid labs have came back and if so she needs someone to call her with the results.

## 2020-01-17 ENCOUNTER — Ambulatory Visit: Payer: PPO | Admitting: Dermatology

## 2020-01-17 ENCOUNTER — Other Ambulatory Visit: Payer: Self-pay

## 2020-01-17 DIAGNOSIS — R21 Rash and other nonspecific skin eruption: Secondary | ICD-10-CM | POA: Diagnosis not present

## 2020-01-17 MED ORDER — HYDROXYZINE HCL 25 MG PO TABS: ORAL_TABLET | ORAL | 2 refills | Status: DC

## 2020-01-17 MED ORDER — LEVOCETIRIZINE DIHYDROCHLORIDE 5 MG PO TABS: ORAL_TABLET | ORAL | 2 refills | Status: AC

## 2020-01-17 MED ORDER — TRIAMCINOLONE ACETONIDE 0.1 % EX CREA: TOPICAL_CREAM | CUTANEOUS | 2 refills | Status: AC

## 2020-01-17 NOTE — Progress Notes (Signed)
   New Patient Visit  Subjective  Kayla Boyle is a 85 y.o. female who presents for the following: Rash.  Patient here today for a break out that is itchy all over her body. Her PCP gave her permethrin cream to apply to affected areas, leave on for 8 hours then rinse off. Break out has been going on since December 2020. She said that her thyroid medication was changed as well as other medications about the same time the break out started. She is not sure which one could be causing it but discontinued some of her medications about 2 months ago with no improvement. Patient not using any other topicals.  No history of travel, she lives alone, no pets, although there is a Engineer, structural she might be getting exposed to.  She is allergic to cats.  She stopped all her new meds about 6-8 weeks ago, and no changes in rash.  The meds she currently takes, she was taking before the rash came up, and had no problems with them.  No infections or illnesses before onset of rash, other than getting sick from thyroid disease.  She took hydroxyzine before but it didn't seem to help.  The following portions of the chart were reviewed this encounter and updated as appropriate:      Review of Systems:  No other skin or systemic complaints except as noted in HPI or Assessment and Plan.  Objective  Well appearing patient in no apparent distress; mood and affect are within normal limits.  All sun exposed areas plus back examined.  Objective  extremities, trunk: Pink edematous papules and plaques with excoriations, no scale, pt states very itchy, spots come and go.   Assessment & Plan  Rash extremities, trunk  Possible Urticaria, chronic idiopathic Start Xyzal 5 mg 1 by mouth every morning #30 2RF Start hydroxyzine 25mg  1-2 at bedtime as needed for itch. #60 1RF Start TMC 0.1% cream to spot treat affected areas 2 times daily as needed for itching. Avoid face, groin, underarms.  Continue Dove soap and anti-itch  moisturizer such as Gold Bond or Eucerin  levocetirizine (XYZAL) 5 MG tablet - extremities, trunk  hydrOXYzine (ATARAX/VISTARIL) 25 MG tablet - extremities, trunk  triamcinolone cream (KENALOG) 0.1 % - extremities, trunk  Return 4-6 weeks, for urticaria/rash.  , RMA, am acting as scribe for Anise Salvo, MD . Documentation: I have reviewed the above documentation for accuracy and completeness, and I agree with the above.  Willeen Niece MD

## 2020-01-17 NOTE — Patient Instructions (Addendum)
Recommend daily broad spectrum sunscreen SPF 30+ to sun-exposed areas, reapply every 2 hours as needed. Call for new or changing lesions.  Start Xyzal 1 by mouth every morning. Start hydroxyzine 25mg  1-2 at bedtime as needed for itch.  Start TMC 0.1% cream to spot treat affected areas twice a day as needed for itching. Avoid face, groin, underarms.   Topical steroids (such as triamcinolone, fluocinolone, fluocinonide, mometasone, clobetasol, halobetasol, betamethasone, hydrocortisone) can cause thinning and lightening of the skin if they are used for too long in the same area. Your physician has selected the right strength medicine for your problem and area affected on the body. Please use your medication only as directed by your physician to prevent side effects.   Recommend Eucerin or Gold Bond Itch Relief cream.  Gentle Skin Care Guide  1. Bathe no more than once a day.  2. Avoid bathing in hot water  3. Use a mild soap like Dove, Vanicream, Cetaphil, CeraVe. Can use Lever 2000 or Cetaphil antibacterial soap  4. Use soap only where you need it. On most days, use it under your arms, between your legs, and on your feet. Let the water rinse other areas unless visibly dirty.  5. When you get out of the bath/shower, use a towel to gently blot your skin dry, don't rub it.  6. While your skin is still a little damp, apply a moisturizing cream such as Vanicream, CeraVe, Cetaphil, Eucerin, Sarna lotion or plain Vaseline Jelly. For hands apply Neutrogena Hand Cream or Excipial Hand Cream.  7. Reapply moisturizer any time you start to itch or feel dry.  8. Sometimes using free and clear laundry detergents can be helpful. Fabric softener sheets should be avoided. Downy Free & Gentle liquid, or any liquid fabric softener that is free of dyes and perfumes, it acceptable to use  9. If your doctor has given you prescription creams you may apply moisturizers over them

## 2020-01-25 ENCOUNTER — Telehealth: Payer: Self-pay | Admitting: Family Medicine

## 2020-01-25 ENCOUNTER — Other Ambulatory Visit: Payer: Self-pay | Admitting: Family Medicine

## 2020-01-25 MED ORDER — TRAMADOL HCL 50 MG PO TABS: 50.0000 mg | ORAL_TABLET | Freq: Three times a day (TID) | ORAL | 4 refills | Status: DC | PRN

## 2020-01-25 NOTE — Telephone Encounter (Signed)
Requested medication (s) are due for refill today: yes  Requested medication (s) are on the active medication list: yes  Last refill:  07/20/19  Future visit scheduled: yes  Notes to clinic:  medication not delegated to NT to RF   Requested Prescriptions  Pending Prescriptions Disp Refills   traMADol (ULTRAM) 50 MG tablet [Pharmacy Med Name: TRAMADOL HCL 50 MG TAB] 90 tablet     Sig: TAKE 1 TO 2 TABLETS BY MOUTH EVERY 8 HOURS AS NEEDED.      Not Delegated - Analgesics:  Opioid Agonists Failed - 01/25/2020 11:51 AM      Failed - This refill cannot be delegated      Failed - Urine Drug Screen completed in last 360 days.      Passed - Valid encounter within last 6 months    Recent Outpatient Visits           3 weeks ago Urticaria   [facility] Malva Limes, MD   2 months ago Diarrhea, unspecified type   [facility] Malva Limes, MD   3 months ago Type 2 diabetes mellitus with other diabetic kidney complication, without long-term current use of insulin [facility])   [facility] Malva Limes, MD   8 months ago Seasonal allergic rhinitis due to pollen   [facility] Malva Limes, MD   9 months ago Acute maxillary sinusitis, recurrence not specified   [facility] Chrismon, Jodell Cipro, [place]       Future Appointments             In 3 weeks Willeen Niece, MD [facility]   In 1 month Fisher, Demetrios Isaacs, MD [facility]

## 2020-01-25 NOTE — Telephone Encounter (Signed)
Joy, from [facility], called and is requesting clarification on the pts tramadol. She states that the pt is requesting a 30 day supply instead of a 15 day supply. Please advise.

## 2020-01-27 DIAGNOSIS — H2513 Age-related nuclear cataract, bilateral: Secondary | ICD-10-CM | POA: Diagnosis not present

## 2020-01-27 LAB — HM DIABETES EYE EXAM

## 2020-02-09 ENCOUNTER — Other Ambulatory Visit: Payer: Self-pay | Admitting: Family Medicine

## 2020-02-21 ENCOUNTER — Ambulatory Visit: Payer: PPO | Admitting: Dermatology

## 2020-02-21 ENCOUNTER — Other Ambulatory Visit: Payer: Self-pay

## 2020-02-21 DIAGNOSIS — L509 Urticaria, unspecified: Secondary | ICD-10-CM | POA: Diagnosis not present

## 2020-02-21 DIAGNOSIS — L853 Xerosis cutis: Secondary | ICD-10-CM | POA: Diagnosis not present

## 2020-02-21 NOTE — Progress Notes (Signed)
   Follow-Up Visit   Subjective  Kayla Boyle is a 85 y.o. female who presents for the following: Urticaria (5wk f/u, clear per pt, pt d/c Xyzal, Hydroxyzine and TMC and has remained clear). She is no longer itching.  She has dry skin on her R elbow where she leans.   The following portions of the chart were reviewed this encounter and updated as appropriate:      Review of Systems:  No other skin or systemic complaints except as noted in HPI or Assessment and Plan.  Objective  Well appearing patient in no apparent distress; mood and affect are within normal limits.  A focused examination was performed including face, arms. Relevant physical exam findings are noted in the Assessment and Plan.  Objective  Right Elbow - Posterior: Xerosis R elbow  Objective  Right Forearm - Anterior: Arms, trunk clear today, mild xerosis arms   Assessment & Plan  Xerosis cutis Right Elbow - Posterior  Recommend Amlactin cream or Eucerin Roughness Relief spot treatment bid  Recommend cushion to use under R elbow when leaning on it for prolonged periods  Urticaria Right Forearm - Anterior  Much improved- Clear today  Recommend mild soap and moisturizing cream 1-2 times daily.   Continue Dove soap Recommend moisturizer qd, sample of Eucerin cream given, or Cerave cream, Cetaphil cream, Aveeno cream  If rash recurs recommend restarting Xyzal 5mg  1 po qd, and TMC 0.1% cr bid to spot treat  Return if symptoms worsen or fail to improve.   I, , RMA, am acting as scribe for Ardis Rowan, MD . Documentation: I have reviewed the above documentation for accuracy and completeness, and I agree with the above.  Willeen Niece MD

## 2020-02-21 NOTE — Patient Instructions (Signed)
Moisturizers Cerave cream, Aveeno cream, Cetaphil cream

## 2020-03-10 NOTE — Progress Notes (Signed)
Established patient visit   Patient: Kayla Boyle   DOB: 09/01/1934   85 y.o. Female  MRN: [id number] Visit Date: 03/14/2020  Today's healthcare provider: Lelon Huh, MD   Chief Complaint  Patient presents with  . Diabetes   Subjective    HPI  Diabetes Mellitus Type II, Follow-up  Lab Results  Component Value Date   HGBA1C 6.1 (A) 03/14/2020   HGBA1C 5.6 10/27/2019   HGBA1C 6.1 (A) 05/12/2018   Wt Readings from Last 3 Encounters:  05/20/17 160 lb (72.6 kg)  04/17/17 161 lb 3.2 oz (73.1 kg)  01/18/16 159 lb (72.1 kg)   Last seen for diabetes 3 months ago.  Management since then includes advising patient to keep off of glipizide  She reports good compliance with treatment. She is not having side effects.  Symptoms: No fatigue No foot ulcerations  No appetite changes No nausea  No paresthesia of the feet  No polydipsia  No polyuria No visual disturbances   No vomiting     Home blood sugar records: not being checked  Episodes of hypoglycemia? No    Current insulin regiment: none Most Recent Eye Exam: up to date Current exercise: none Current diet habits: well balanced  Pertinent Labs: Lab Results  Component Value Date   CHOL 126 10/27/2019   HDL 40 10/27/2019   LDLCALC 61 10/27/2019   TRIG 144 10/27/2019   CHOLHDL 3.2 10/27/2019   Lab Results  Component Value Date   NA 139 10/27/2019   K 4.3 10/27/2019   CREATININE 1.21 (H) 10/27/2019   GFRNONAA 41 (L) 10/27/2019   GFRAA 48 (L) 10/27/2019   GLUCOSE 93 10/27/2019      Hypothyroid, follow-up  Lab Results  Component Value Date   TSH 0.530 12/31/2019   TSH 0.006 (L) 11/23/2019   TSH 0.049 (L) 10/27/2019   T4TOTAL 7.3 12/31/2019   T4TOTAL 14.7 (H) 11/23/2019   Wt Readings from Last 3 Encounters:  05/20/17 160 lb (72.6 kg)  04/17/17 161 lb 3.2 oz (73.1 kg)  01/18/16 159 lb (72.1 kg)    She was last seen for hypothyroid 2 months ago.  Management since that visit includes continue same  medication. She reports good compliance with treatment. She is not having side effects.   Symptoms: No change in energy level No constipation  No diarrhea No heat / cold intolerance  No nervousness No palpitations  No weight changes     She also complains of left shoulder pain the last couple of weeks. Has stinging sensation going down her arms at times. Hurts to lift arm. Also sore in back of neck when she looks down. No arm weakness.   She also complains of eyes irritating her and wants an antibiotic eye drop. She talked to her eye doctor about it and he suggesting OTC Refresh moisturizing eye drops which she states hasn't helped.      Medications: Outpatient Medications Prior to Visit  Medication Sig  . albuterol (VENTOLIN HFA) 108 (90 Base) MCG/ACT inhaler Inhale 2 puffs into the lungs every 6 (six) hours as needed for wheezing or shortness of breath.  Marland Kitchen aspirin 81 MG tablet Take 1 tablet by mouth daily.  Marland Kitchen atenolol-chlorthalidone (TENORETIC) 50-25 MG tablet TAKE ONE TABLET BY MOUTH EACH DAY.  . benzonatate (TESSALON) 100 MG capsule TAKE 1 CAPSULE BY MOUTH TWICE A DAY AS NEEDED FOR COUGH  . Blood Glucose Monitoring Suppl (ACCU-CHEK COMPACT CARE KIT) KIT As directed  .  esomeprazole (NEXIUM) 40 MG capsule Take 1 capsule (40 mg total) by mouth daily.  . furosemide (LASIX) 20 MG tablet Take 1 tablet (20 mg total) by mouth daily as needed (swelling).  . gabapentin (NEURONTIN) 300 MG capsule Take 1 capsule (300 mg total) by mouth 3 (three) times daily.  Marland Kitchen glucose blood (ACCU-CHEK AVIVA PLUS) test strip Use to check blood sugar once a day  . hydrOXYzine (ATARAX/VISTARIL) 25 MG tablet Take 1-2 by mouth at bedtime as needed for itch.  . levocetirizine (XYZAL) 5 MG tablet Take 1 by mouth every morning.  Marland Kitchen levothyroxine (SYNTHROID) 75 MCG tablet TAKE 1 TABLET BY MOUTH EVERY DAY  . lovastatin (MEVACOR) 40 MG tablet TAKE 1 TABLET BY MOUTH AT BEDTIME  . metFORMIN (GLUCOPHAGE) 500 MG tablet  Take 1 tablet (500 mg total) by mouth daily.  . methocarbamol (ROBAXIN) 500 MG tablet TAKE TWO TABLETS BY MOUTH EVERY 6 HOURS AS NEEDED FOR MUSCLE SPASMS  . montelukast (SINGULAIR) 10 MG tablet Take 1 tablet (10 mg total) by mouth daily.  . permethrin (ELIMITE) 5 % cream Apply head to toe, rinse off after 8 hours. May repeat after 14 days.  . risedronate (ACTONEL) 35 MG tablet TAKE 1 TABLET BY MOUTH ONCE A WEEK WITH A FULL GLASS OF WATER AND DO NOT LIE BACK DOWN FOR 30 MINUTES AFTER.  . traMADol (ULTRAM) 50 MG tablet Take 1-2 tablets (50-100 mg total) by mouth every 8 (eight) hours as needed.  . triamcinolone cream (KENALOG) 0.1 % Apply 2 times daily to spot treat affected areas as needed for itch. Avoid face, groin, underarms.  . vitamin B-12 (CYANOCOBALAMIN) 100 MCG tablet Take 800 mcg by mouth daily.   . Vitamin D, Cholecalciferol, 400 UNITS CHEW Chew 1 tablet by mouth daily.   No facility-administered medications prior to visit.       Objective    BP 124/76 (BP Location: Right Arm)   Pulse 72   Temp 98 F (36.7 C)   Resp 16    Physical Exam  General appearance: Well developed, well nourished female, cooperative and in no acute distress Head: Normocephalic, without obvious abnormality, atraumatic Respiratory: Respirations even and unlabored, normal respiratory rate Extremities: Below knee amputation of right lower extremity is noted.  MS: Tender around left shoulder joint. Pain on limits of ROM. Slight tenderness over mid cervical spin. No gross deformities. Psych: Appropriate mood and affect. Neurologic: Mental status: Alert, oriented to person, place, and time, thought content appropriate.   Results for orders placed or performed in visit on 03/14/20  POCT HgB A1C  Result Value Ref Range   Hemoglobin A1C 6.1 (A) 4.0 - 5.6 %   HbA1c POC (<> result, manual entry)     HbA1c, POC (prediabetic range)     HbA1c, POC (controlled diabetic range)      Assessment & Plan     1.  Type 2 diabetes mellitus with other diabetic kidney complication, without long-term current use of insulin (HCC) Well controlled.  Continue current medications.    2. Osteoporosis, unspecified osteoporosis type, unspecified pathological fracture presence Due for - DG Bone Density [place]; Future  3. Essential hypertension Well controlled.  Continue current medications.    4. Hypothyroidism, unspecified type Due to check - T4, free - TSH  5. Anemia, unspecified type Due to check- CBC  6. Neck pain Try meloxicam as below. Consider XR c-spine if not improving with meloxicam  7. Chronic left shoulder pain - meloxicam (MOBIC) 7.5 MG  tablet; Take 1 tablet (7.5 mg total) by mouth daily as needed (shoulder pain).  Dispense: 30 tablet; Refill: 0   8. Need for immunization against influenza  - Flu Vaccine QUAD High Dose(Fluad)  9. Eye irritation She requests antibiotic eyedrop- ciprofloxacin (CILOXAN) 0.3 % ophthalmic solution; Place 1 drop into both eyes every 4 (four) hours while awake for 7 days.  Dispense: 5 mL; Refill: 0 Advised she should return to her eye doctor for further evaluation if not helpful.   Future Appointments  Date Time Provider [place]  09/11/2020  8:00 AM Caryn Section, Kirstie Peri, MD [facility]          The entirety of the information documented in the History of Present Illness, Review of Systems and Physical Exam were personally obtained by me. Portions of this information were initially documented by the CMA and reviewed by me for thoroughness and accuracy.      Lelon Huh, MD  [facility] [phone] (phone) [phone] (fax)  [place]

## 2020-03-14 ENCOUNTER — Ambulatory Visit (INDEPENDENT_AMBULATORY_CARE_PROVIDER_SITE_OTHER): Payer: PPO | Admitting: Family Medicine

## 2020-03-14 ENCOUNTER — Other Ambulatory Visit: Payer: Self-pay

## 2020-03-14 ENCOUNTER — Encounter: Payer: Self-pay | Admitting: Family Medicine

## 2020-03-14 VITALS — BP 124/76 | HR 72 | Temp 98.0°F | Resp 16

## 2020-03-14 DIAGNOSIS — D649 Anemia, unspecified: Secondary | ICD-10-CM

## 2020-03-14 DIAGNOSIS — M25512 Pain in left shoulder: Secondary | ICD-10-CM

## 2020-03-14 DIAGNOSIS — Z23 Encounter for immunization: Secondary | ICD-10-CM | POA: Diagnosis not present

## 2020-03-14 DIAGNOSIS — M542 Cervicalgia: Secondary | ICD-10-CM

## 2020-03-14 DIAGNOSIS — E1129 Type 2 diabetes mellitus with other diabetic kidney complication: Secondary | ICD-10-CM | POA: Diagnosis not present

## 2020-03-14 DIAGNOSIS — M810 Age-related osteoporosis without current pathological fracture: Secondary | ICD-10-CM

## 2020-03-14 DIAGNOSIS — E039 Hypothyroidism, unspecified: Secondary | ICD-10-CM | POA: Diagnosis not present

## 2020-03-14 DIAGNOSIS — I10 Essential (primary) hypertension: Secondary | ICD-10-CM

## 2020-03-14 DIAGNOSIS — G8929 Other chronic pain: Secondary | ICD-10-CM | POA: Diagnosis not present

## 2020-03-14 LAB — POCT GLYCOSYLATED HEMOGLOBIN (HGB A1C): Hemoglobin A1C: 6.1 % — AB (ref 4.0–5.6)

## 2020-03-14 MED ORDER — CIPROFLOXACIN HCL 0.3 % OP SOLN
1.0000 [drp] | OPHTHALMIC | 0 refills | Status: DC
Start: 2020-03-14 — End: 2020-04-02

## 2020-03-14 MED ORDER — MELOXICAM 7.5 MG PO TABS: 7.5000 mg | ORAL_TABLET | Freq: Every day | ORAL | 0 refills | Status: AC | PRN

## 2020-03-15 LAB — CBC
Hematocrit: 37.8 % (ref 34.0–46.6)
Hemoglobin: 12.2 g/dL (ref 11.1–15.9)
MCH: 29.4 pg (ref 26.6–33.0)
MCHC: 32.3 g/dL (ref 31.5–35.7)
MCV: 91 fL (ref 79–97)
Platelets: 284 10*3/uL (ref 150–450)
RBC: 4.15 x10E6/uL (ref 3.77–5.28)
RDW: 13.1 % (ref 11.7–15.4)
WBC: 7.3 10*3/uL (ref 3.4–10.8)

## 2020-03-15 LAB — TSH: TSH: 0.722 u[IU]/mL (ref 0.450–4.500)

## 2020-03-15 LAB — T4, FREE: Free T4: 1.48 ng/dL (ref 0.82–1.77)

## 2020-03-17 ENCOUNTER — Telehealth: Payer: Self-pay

## 2020-03-17 NOTE — Telephone Encounter (Signed)
Patient advised of lab results and verbalized understanding.  

## 2020-03-17 NOTE — Telephone Encounter (Signed)
-----   Message from Malva Limes, MD sent at 03/15/2020 10:20 AM EDT ----- Thyroid functions are back to normal. Continue current dose of levothyroxine. Blood count is back to normal. Continue current medications.  Follow up in march  as scheduled.

## 2020-03-23 ENCOUNTER — Telehealth: Payer: Self-pay

## 2020-03-23 NOTE — Telephone Encounter (Signed)
Patient advised. She states she will get it at her next follow up appointment.

## 2020-03-23 NOTE — Telephone Encounter (Signed)
Patient had both pneumonia vaccines after age 85. Does she need anymore booster doses of either pneumonia vaccine? Please advise.

## 2020-03-23 NOTE — Telephone Encounter (Signed)
Copied from CRM [id number]. Topic: General - Other >> Mar 23, 2020  8:27 AM Jaquita Rector A wrote: Reason for CRM: Patient called to inquire of Dr Sherrie Mustache does she need a Pmuemonia vaccine and if so when. Asking for a call back with an answer please Ph# [phone]

## 2020-03-23 NOTE — Telephone Encounter (Signed)
She may want another Pneumovax 23 since she has COPD.

## 2020-03-31 ENCOUNTER — Other Ambulatory Visit: Payer: Self-pay | Admitting: Family Medicine

## 2020-03-31 NOTE — Telephone Encounter (Signed)
Requested medication (s) are due for refill today: yes  Requested medication (s) are on the active medication list:yes  Last refill:  03/14/20 for 7 days  Future visit scheduled: yes 5 months  Notes to clinic:  I called the patient and she states her symptoms return as soon as off the medication. She has Dry eye and states that she just saw the eye doctor but has not returned as office note advised her. She states that this is only thing that helps. Eyes are gritty and start to run water. She has no allergies or sinus issues.She asking please to have refill.  This medication is off protocol!    Requested Prescriptions  Pending Prescriptions Disp Refills   ciprofloxacin (CILOXAN) 0.3 % ophthalmic solution [Pharmacy Med Name: CIPROFLOXACIN HCL 0.3% EYE SOLN ML] 5 mL 0    Sig: PLACE 1 DROP INTO BOTH EYES EVERY 4 HOURS WHILE AWAKE FOR 7 DAYS      Off-Protocol Failed - 03/31/2020 11:26 AM      Failed - Medication not assigned to a protocol, review manually.      Passed - Valid encounter within last 12 months    Recent Outpatient Visits           2 weeks ago Type 2 diabetes mellitus with other diabetic kidney complication, without long-term current use of insulin [facility])   [facility] Malva Limes, MD   3 months ago Urticaria   [facility] Malva Limes, MD   4 months ago Diarrhea, unspecified type   [facility] Malva Limes, MD   5 months ago Type 2 diabetes mellitus with other diabetic kidney complication, without long-term current use of insulin [facility])   [facility] Malva Limes, MD   10 months ago Seasonal allergic rhinitis due to pollen   [facility] Sherrie Mustache, Demetrios Isaacs, MD       Future Appointments             In 5 months Fisher, Demetrios Isaacs, MD [facility], [facility]

## 2020-04-04 ENCOUNTER — Encounter: Payer: Self-pay | Admitting: Emergency Medicine

## 2020-04-04 ENCOUNTER — Emergency Department: Payer: PPO

## 2020-04-04 ENCOUNTER — Other Ambulatory Visit: Payer: Self-pay

## 2020-04-04 ENCOUNTER — Emergency Department
Admission: EM | Admit: 2020-04-04 | Discharge: 2020-04-04 | Disposition: A | Discharge status: Home / Self Care | Payer: PPO | Attending: Emergency Medicine | Admitting: Emergency Medicine

## 2020-04-04 DIAGNOSIS — S0990XA Unspecified injury of head, initial encounter: Secondary | ICD-10-CM | POA: Diagnosis not present

## 2020-04-04 DIAGNOSIS — R220 Localized swelling, mass and lump, head: Secondary | ICD-10-CM | POA: Diagnosis not present

## 2020-04-04 DIAGNOSIS — W19XXXA Unspecified fall, initial encounter: Secondary | ICD-10-CM | POA: Diagnosis not present

## 2020-04-04 DIAGNOSIS — J449 Chronic obstructive pulmonary disease, unspecified: Secondary | ICD-10-CM | POA: Diagnosis not present

## 2020-04-04 DIAGNOSIS — Z79890 Hormone replacement therapy (postmenopausal): Secondary | ICD-10-CM | POA: Diagnosis not present

## 2020-04-04 DIAGNOSIS — Y9301 Activity, walking, marching and hiking: Secondary | ICD-10-CM | POA: Diagnosis not present

## 2020-04-04 DIAGNOSIS — S0083XA Contusion of other part of head, initial encounter: Secondary | ICD-10-CM | POA: Diagnosis not present

## 2020-04-04 DIAGNOSIS — Z87891 Personal history of nicotine dependence: Secondary | ICD-10-CM | POA: Insufficient documentation

## 2020-04-04 DIAGNOSIS — E039 Hypothyroidism, unspecified: Secondary | ICD-10-CM | POA: Diagnosis not present

## 2020-04-04 DIAGNOSIS — I129 Hypertensive chronic kidney disease with stage 1 through stage 4 chronic kidney disease, or unspecified chronic kidney disease: Secondary | ICD-10-CM | POA: Insufficient documentation

## 2020-04-04 DIAGNOSIS — Z79899 Other long term (current) drug therapy: Secondary | ICD-10-CM | POA: Insufficient documentation

## 2020-04-04 DIAGNOSIS — Y92092 Bedroom in other non-institutional residence as the place of occurrence of the external cause: Secondary | ICD-10-CM | POA: Insufficient documentation

## 2020-04-04 DIAGNOSIS — Z7984 Long term (current) use of oral hypoglycemic drugs: Secondary | ICD-10-CM | POA: Diagnosis not present

## 2020-04-04 DIAGNOSIS — E119 Type 2 diabetes mellitus without complications: Secondary | ICD-10-CM | POA: Diagnosis not present

## 2020-04-04 DIAGNOSIS — W010XXA Fall on same level from slipping, tripping and stumbling without subsequent striking against object, initial encounter: Secondary | ICD-10-CM | POA: Diagnosis not present

## 2020-04-04 DIAGNOSIS — I10 Essential (primary) hypertension: Secondary | ICD-10-CM | POA: Diagnosis not present

## 2020-04-04 DIAGNOSIS — N183 Chronic kidney disease, stage 3 unspecified: Secondary | ICD-10-CM | POA: Diagnosis not present

## 2020-04-04 DIAGNOSIS — Z7982 Long term (current) use of aspirin: Secondary | ICD-10-CM | POA: Insufficient documentation

## 2020-04-04 NOTE — ED Triage Notes (Signed)
First nurse note- pt tripped and fell bringing in groceries.  VSS with EMS, no blood thinners. No LOC. + hematoma

## 2020-04-04 NOTE — ED Triage Notes (Signed)
Pt here for mechanical fall.  Only pain to head. No LOC or blood thinners.  Pt would like to just get checked out so can go home.  NAD. Unlabored. Oriented at this time.

## 2020-04-04 NOTE — Discharge Instructions (Addendum)
Your head CT shows some swelling to your forehead where your bruising is but no evidence of injury to your brain.  You can ice your forehead tonight.  Please call your primary care for a follow-up appointment this week or next week.  Return the emergency department for worsening symptoms.

## 2020-04-04 NOTE — ED Provider Notes (Signed)
[facility] Emergency Department Provider Note  ____________________________________________  Time seen: Approximately 3:58 PM  I have reviewed the triage vital signs and the nursing notes.   HISTORY  Chief Complaint Fall    HPI Kayla Boyle is a 85 y.o. female that presents to the emergency department for evaluation of head injury.  Patient did just put away her groceries and was walking to her bedroom when she tripped on her shoe.  She fell face forward and hit her forehead on the ground.  She did not lose consciousness.  She felt fine after the fall but EMS recommended that she come to the emergency department to have her head injury evaluated.  Patient states that she has been in the ER for 3 hours now and would like to be discharged so she can go home and have lunch.  She has some bruising to her forehead.  She also has a small abrasion to her left knee, which does not hurt.  She denies headache, neck pain.  She has been up walking.  She is concerned about the appearance of the bruising for church on Sunday.   Past Medical History:  Diagnosis Date  . Allergy   . Arthritis    osteo  . Diabetes mellitus without complication ([place])    type 2  . H/O acute poliomyelitis 11/24/2014  . History of chicken pox   . Hyperlipidemia   . Hypertension   . Hypothyroidism   . Shingles     Patient Active Problem List   Diagnosis Date Noted  . Acquired trigger finger 05/28/2017  . Bursitis of shoulder 05/28/2017  . Pain, phantom limb ([place]) 10/17/2016  . Low back pain 10/24/2015  . Chronic cough 08/24/2015  . Allergic rhinitis 02/09/2015  . Arthritis of shoulder region, right 01/02/2015  . Arthritis 11/24/2014  . Chronic kidney disease (CKD), stage III (moderate) 11/24/2014  . Club foot 11/24/2014  . Diabetes mellitus with renal complications ([place]) 62/37/6283  . Eczema of hand 11/24/2014  . GERD (gastroesophageal reflux disease) 11/24/2014  . Arthralgia of hip  11/24/2014  . Hyperlipidemia 11/24/2014  . Hypertension 11/24/2014  . Hypomagnesemia 11/24/2014  . Hypothyroid 11/24/2014  . IBS (irritable bowel syndrome) 11/24/2014  . Mobility impaired 11/24/2014  . Restless leg 11/24/2014  . Restrictive lung disease 11/24/2014  . Status post below knee amputation 11/24/2014  . Scoliosis 11/24/2014  . Vitamin D deficiency 11/24/2014  . COPD, moderate ([place]) 12/16/2013  . OP (osteoporosis) 09/02/2011    Past Surgical History:  Procedure Laterality Date  . ABDOMINAL HYSTERECTOMY  1963  . APPENDECTOMY    . CARPAL TUNNEL RELEASE    . CHOLECYSTECTOMY  10/2003   laproscopic  . ELBOW SURGERY Bilateral   . HAND SURGERY  01/2010  . LEG AMPUTATION BELOW KNEE Right 1975   traumatic; bleow knee and club foot    Prior to Admission medications   Medication Sig Start Date End Date Taking? Authorizing Provider  albuterol (VENTOLIN HFA) 108 (90 Base) MCG/ACT inhaler Inhale 2 puffs into the lungs every 6 (six) hours as needed for wheezing or shortness of breath. 03/26/18   Birdie Sons, MD  aspirin 81 MG tablet Take 1 tablet by mouth daily. 07/19/10   [provider]  atenolol-chlorthalidone (TENORETIC) 50-25 MG tablet TAKE ONE TABLET BY MOUTH EACH DAY. 12/31/19   Birdie Sons, MD  benzonatate (TESSALON) 100 MG capsule TAKE 1 CAPSULE BY MOUTH TWICE A DAY AS NEEDED FOR COUGH 12/07/19  Birdie Sons, MD  Blood Glucose Monitoring Suppl (ACCU-CHEK COMPACT CARE KIT) KIT As directed 07/12/15   Birdie Sons, MD  ciprofloxacin (CILOXAN) 0.3 % ophthalmic solution PLACE 1 DROP INTO BOTH EYES EVERY 4 HOURS WHILE AWAKE FOR 7 DAYS 04/02/20   Birdie Sons, MD  esomeprazole (NEXIUM) 40 MG capsule Take 1 capsule (40 mg total) by mouth daily. 12/31/19   Birdie Sons, MD  furosemide (LASIX) 20 MG tablet Take 1 tablet (20 mg total) by mouth daily as needed (swelling). 01/20/18   Birdie Sons, MD  gabapentin (NEURONTIN) 300 MG capsule Take 1 capsule  (300 mg total) by mouth 3 (three) times daily. 07/20/19   Birdie Sons, MD  glucose blood (ACCU-CHEK AVIVA PLUS) test strip Use to check blood sugar once a day 01/02/15   Birdie Sons, MD  hydrOXYzine (ATARAX/VISTARIL) 25 MG tablet Take 1-2 by mouth at bedtime as needed for itch. 01/17/20   Brendolyn Patty, MD  levocetirizine (XYZAL) 5 MG tablet Take 1 by mouth every morning. 01/17/20   Brendolyn Patty, MD  levothyroxine (SYNTHROID) 75 MCG tablet TAKE 1 TABLET BY MOUTH EVERY DAY 12/31/19   Birdie Sons, MD  lovastatin (MEVACOR) 40 MG tablet TAKE 1 TABLET BY MOUTH AT BEDTIME 02/09/20   Birdie Sons, MD  meloxicam (MOBIC) 7.5 MG tablet Take 1 tablet (7.5 mg total) by mouth daily as needed (shoulder pain). 03/14/20   Birdie Sons, MD  metFORMIN (GLUCOPHAGE) 500 MG tablet Take 1 tablet (500 mg total) by mouth daily. 12/31/19   Birdie Sons, MD  methocarbamol (ROBAXIN) 500 MG tablet TAKE TWO TABLETS BY MOUTH EVERY 6 HOURS AS NEEDED FOR MUSCLE SPASMS 01/20/18   Birdie Sons, MD  montelukast (SINGULAIR) 10 MG tablet Take 1 tablet (10 mg total) by mouth daily. 05/11/19   Birdie Sons, MD  permethrin (ELIMITE) 5 % cream Apply head to toe, rinse off after 8 hours. May repeat after 14 days. 01/06/20   Birdie Sons, MD  risedronate (ACTONEL) 35 MG tablet TAKE 1 TABLET BY MOUTH ONCE A WEEK WITH A FULL GLASS OF WATER AND DO NOT LIE BACK DOWN FOR 30 MINUTES AFTER. 10/27/19   Birdie Sons, MD  traMADol (ULTRAM) 50 MG tablet Take 1-2 tablets (50-100 mg total) by mouth every 8 (eight) hours as needed. 01/25/20   Birdie Sons, MD  triamcinolone cream (KENALOG) 0.1 % Apply 2 times daily to spot treat affected areas as needed for itch. Avoid face, groin, underarms. 01/17/20   Brendolyn Patty, MD  vitamin B-12 (CYANOCOBALAMIN) 100 MCG tablet Take 800 mcg by mouth daily.     [provider]  Vitamin D, Cholecalciferol, 400 UNITS CHEW Chew 1 tablet by mouth daily.    [provider]     Allergies Fosamax [alendronate sodium], Bee venom, Cat hair extract, Codeine, and Penicillins  Family History  Problem Relation Age of Onset  . Pancreatic cancer Father   . Prostate cancer Brother   . Diabetes Brother        type 2  . Hepatitis C Daughter   . HIV/AIDS Son   . Cancer Son        LYMPH NODE CANCER  . Cancer Other 57       small cell cancer  . Glaucoma Sister   . Cancer Sister        kidney removed    Social History Social History   Tobacco Use  .  Smoking status: Former Smoker    Types: Cigarettes    Quit date: 10/06/2001    Years since quitting: 18.5  . Smokeless tobacco: Never Used  Vaping Use  . Vaping Use: Never used  Substance Use Topics  . Alcohol use: No    Alcohol/week: 0.0 standard drinks  . Drug use: No     Review of Systems  Cardiovascular: No chest pain. Respiratory: No SOB. Gastrointestinal: No abdominal pain.  No nausea, no vomiting.  Musculoskeletal: Negative for musculoskeletal pain. Skin: Negative for rash, abrasions, lacerations. Positive for ecchymosis. Neurological: Negative for headaches, numbness or tingling   ____________________________________________   PHYSICAL EXAM:  VITAL SIGNS: ED Triage Vitals  Enc Vitals Group     BP 04/04/20 1248 (!) 141/76     Pulse Rate 04/04/20 1248 67     Resp 04/04/20 1248 18     Temp 04/04/20 1248 97.6 F (36.4 C)     Temp Source 04/04/20 1248 Oral     SpO2 04/04/20 1248 97 %     Weight 04/04/20 1244 147 lb (66.7 kg)     Height 04/04/20 1244 4' 11"  (1.499 m)     Head Circumference --      Peak Flow --      Pain Score 04/04/20 1244 7     Pain Loc --      Pain Edu? --      Excl. in [place]? --      Constitutional: Alert and oriented. Well appearing and in no acute distress. Eyes: Conjunctivae are normal. PERRL. EOMI. Head: Ecchymosis overlying forehead, worse on the right.  No ecchymosis surrounding eyes. ENT:      Ears:      Nose: No congestion/rhinnorhea.       Mouth/Throat: Mucous membranes are moist.  Neck: No stridor.No cervical spine tenderness to palpation. Cardiovascular: Normal rate, regular rhythm.  Good peripheral circulation. Respiratory: Normal respiratory effort without tachypnea or retractions. Lungs CTAB. Good air entry to the bases with no decreased or absent breath sounds. Musculoskeletal: Full range of motion to all extremities. No gross deformities appreciated.  Full range of motion of bilateral hips.  No tenderness to palpation. Neurologic:  Normal speech and language. No gross focal neurologic deficits are appreciated.  Skin:  Skin is warm, dry and intact.  Small abrasion to right knee. Psychiatric: Mood and affect are normal. Speech and behavior are normal. Patient exhibits appropriate insight and judgement.   ____________________________________________   LABS (all labs ordered are listed, but only abnormal results are displayed)  Labs Reviewed - No data to display ____________________________________________  EKG   ____________________________________________  RADIOLOGY Robinette Haines, personally viewed and evaluated these images (plain radiographs) as part of my medical decision making, as well as reviewing the written report by the radiologist.  CT Head Wo Contrast  Result Date: 04/04/2020 CLINICAL DATA:  Recent fall with forehead bruising, initial encounter EXAM: CT HEAD WITHOUT CONTRAST TECHNIQUE: Contiguous axial images were obtained from the base of the skull through the vertex without intravenous contrast. COMPARISON:  None. FINDINGS: Brain: No evidence of acute infarction, hemorrhage, hydrocephalus, extra-axial collection or mass lesion/mass effect. Vascular: No hyperdense vessel or unexpected calcification. Skull: Normal. Negative for fracture or focal lesion. Sinuses/Orbits: No acute finding. Other: Forehead scalp swelling is noted consistent with the recent injury. IMPRESSION: Mild forehead scalp swelling on  the right. No acute intracranial abnormality noted. Electronically Signed   By: Inez Catalina M.D.   On: 04/04/2020 13:26  ____________________________________________    PROCEDURES  Procedure(s) performed:    Procedures    Medications - No data to display   ____________________________________________   INITIAL IMPRESSION / ASSESSMENT AND PLAN / ED COURSE  Pertinent labs & imaging results that were available during my care of the patient were reviewed by me and considered in my medical decision making (see chart for details).  Review of the [place] CSRS was performed in accordance of the [place] prior to dispensing any controlled drugs.    Patient presented to the emergency department for evaluation of head injury.  Vital signs and exam are reassuring.  Head CT negative for acute intercranial abnormality.  Patient is requesting multiple times for her discharge.  She is anxiously waiting to go home.  Patient is to follow up with PCP as directed. Patient is given ED precautions to return to the ED for any worsening or new symptoms.  MARYBEL ALCOTT was evaluated in Emergency Department on 04/04/2020 for the symptoms described in the history of present illness. She was evaluated in the context of the global COVID-19 pandemic, which necessitated consideration that the patient might be at risk for infection with the SARS-CoV-2 virus that causes COVID-19. Institutional protocols and algorithms that pertain to the evaluation of patients at risk for COVID-19 are in a state of rapid change based on information released by regulatory bodies including the CDC and federal and state organizations. These policies and algorithms were followed during the patient's care in the ED.    ____________________________________________  FINAL CLINICAL IMPRESSION(S) / ED DIAGNOSES  Final diagnoses:  Injury of head, initial encounter      NEW MEDICATIONS STARTED DURING THIS VISIT:  ED Discharge Orders     None          This chart was dictated using voice recognition software/Dragon. Despite best efforts to proofread, errors can occur which can change the meaning. Any change was purely unintentional.    Laban Emperor, PA-C 04/04/20 1736    Carrie Mew, MD 04/05/20 2216

## 2020-04-17 ENCOUNTER — Ambulatory Visit (INDEPENDENT_AMBULATORY_CARE_PROVIDER_SITE_OTHER): Payer: PPO | Admitting: Family Medicine

## 2020-04-17 ENCOUNTER — Encounter: Payer: Self-pay | Admitting: Family Medicine

## 2020-04-17 ENCOUNTER — Other Ambulatory Visit: Payer: Self-pay

## 2020-04-17 VITALS — BP 135/58 | HR 66 | Temp 97.9°F | Resp 16 | Ht 59.0 in | Wt 147.0 lb

## 2020-04-17 DIAGNOSIS — S0083XA Contusion of other part of head, initial encounter: Secondary | ICD-10-CM

## 2020-04-17 MED ORDER — BENZONATATE 100 MG PO CAPS
ORAL_CAPSULE | ORAL | 3 refills | Status: DC
Start: 2020-04-17 — End: 2020-09-25

## 2020-04-20 ENCOUNTER — Other Ambulatory Visit: Payer: PPO

## 2020-04-24 NOTE — Progress Notes (Signed)
Established patient visit   Patient: Kayla Boyle   DOB: January 30, 1935   85 y.o. Female  MRN: [id number] Visit Date: 04/17/2020  Today's healthcare provider: Lelon Huh, MD   Chief Complaint  Patient presents with  . Fall   Subjective    HPI HPI    04/04/20 hospital visit did not admit   Last edited by Lazarus Gowda, CMA on 04/17/2020  9:59 AM. (History)      Patient tripped and hit the her forehead on 04/04/2020 and has been black and blue since then. No vision difficulties. No trouble hearing. Is slowly improving.     Medications: Outpatient Medications Prior to Visit  Medication Sig  . albuterol (VENTOLIN HFA) 108 (90 Base) MCG/ACT inhaler Inhale 2 puffs into the lungs every 6 (six) hours as needed for wheezing or shortness of breath.  [place] Kitchen aspirin 81 MG tablet Take 1 tablet by mouth daily.  [place] Kitchen atenolol-chlorthalidone (TENORETIC) 50-25 MG tablet TAKE ONE TABLET BY MOUTH EACH DAY.  [place] Kitchen Blood Glucose Monitoring Suppl (ACCU-CHEK COMPACT CARE KIT) KIT As directed  . ciprofloxacin (CILOXAN) 0.3 % ophthalmic solution PLACE 1 DROP INTO BOTH EYES EVERY 4 HOURS WHILE AWAKE FOR 7 DAYS  . esomeprazole (NEXIUM) 40 MG capsule Take 1 capsule (40 mg total) by mouth daily.  . furosemide (LASIX) 20 MG tablet Take 1 tablet (20 mg total) by mouth daily as needed (swelling).  . gabapentin (NEURONTIN) 300 MG capsule Take 1 capsule (300 mg total) by mouth 3 (three) times daily.  [place] Kitchen glucose blood (ACCU-CHEK AVIVA PLUS) test strip Use to check blood sugar once a day  . levocetirizine (XYZAL) 5 MG tablet Take 1 by mouth every morning.  [place] Kitchen levothyroxine (SYNTHROID) 75 MCG tablet TAKE 1 TABLET BY MOUTH EVERY DAY  . lovastatin (MEVACOR) 40 MG tablet TAKE 1 TABLET BY MOUTH AT BEDTIME  . meloxicam (MOBIC) 7.5 MG tablet Take 1 tablet (7.5 mg total) by mouth daily as needed (shoulder pain).  . metFORMIN (GLUCOPHAGE) 500 MG tablet Take 1 tablet (500 mg total) by mouth daily.  . methocarbamol (ROBAXIN) 500  MG tablet TAKE TWO TABLETS BY MOUTH EVERY 6 HOURS AS NEEDED FOR MUSCLE SPASMS  . montelukast (SINGULAIR) 10 MG tablet Take 1 tablet (10 mg total) by mouth daily.  . risedronate (ACTONEL) 35 MG tablet TAKE 1 TABLET BY MOUTH ONCE A WEEK WITH A FULL GLASS OF WATER AND DO NOT LIE BACK DOWN FOR 30 MINUTES AFTER.  . traMADol (ULTRAM) 50 MG tablet Take 1-2 tablets (50-100 mg total) by mouth every 8 (eight) hours as needed.  . triamcinolone cream (KENALOG) 0.1 % Apply 2 times daily to spot treat affected areas as needed for itch. Avoid face, groin, underarms.  . vitamin B-12 (CYANOCOBALAMIN) 100 MCG tablet Take 800 mcg by mouth daily.   . Vitamin D, Cholecalciferol, 400 UNITS CHEW Chew 1 tablet by mouth daily.  . [DISCONTINUED] benzonatate (TESSALON) 100 MG capsule TAKE 1 CAPSULE BY MOUTH TWICE A DAY AS NEEDED FOR COUGH  . [DISCONTINUED] hydrOXYzine (ATARAX/VISTARIL) 25 MG tablet Take 1-2 by mouth at bedtime as needed for itch.  . [DISCONTINUED] permethrin (ELIMITE) 5 % cream Apply head to toe, rinse off after 8 hours. May repeat after 14 days.   No facility-administered medications prior to visit.    Review of Systems  Constitutional: Negative for appetite change, chills, fatigue and fever.  Respiratory: Negative for chest tightness and shortness of breath.   Cardiovascular: Negative for chest  pain and palpitations.  Gastrointestinal: Negative for abdominal pain, nausea and vomiting.  Neurological: Negative for dizziness and weakness.     Objective    BP (!) 135/58 (BP Location: Left Arm, Patient Position: Sitting, Cuff Size: Normal)   Pulse 66   Temp 97.9 F (36.6 C)   Resp 16   Ht 4' 11"  (1.499 m)   Wt 147 lb (66.7 kg)   SpO2 99%   BMI 29.69 kg/m    Physical Exam  Contusion over forehead right > left. No open wounds.   No results found for any visits on 04/17/20.  Assessment & Plan     1. Contusion of face, initial encounter Healing appropriately. Call if any new or changing  sx.   Refill- benzonatate (TESSALON) 100 MG capsule; TAKE 1 CAPSULE BY MOUTH TWICE A DAY AS NEEDED FOR COUGH  Dispense: 20 capsule; Refill: 3         The entirety of the information documented in the History of Present Illness, Review of Systems and Physical Exam were personally obtained by me. Portions of this information were initially documented by the CMA and reviewed by me for thoroughness and accuracy.      Lelon Huh, MD  [facility] [phone] (phone) [phone] (fax)  [place]

## 2020-06-02 ENCOUNTER — Other Ambulatory Visit: Payer: Self-pay | Admitting: Family Medicine

## 2020-06-02 DIAGNOSIS — H1013 Acute atopic conjunctivitis, bilateral: Secondary | ICD-10-CM

## 2020-06-09 ENCOUNTER — Other Ambulatory Visit: Payer: Self-pay | Admitting: Family Medicine

## 2020-06-09 NOTE — Telephone Encounter (Signed)
Requested medication (s) are due for refill today:  Yes  Requested medication (s) are on the active medication list:  Yes  Future visit scheduled:  Yes  Last Refill: 01/20/2018  Notes to clinic: Furosemide has not been reordered since 01/2018; last Creatinine in 10/2019 was elevated.  Please advise.   Requested Prescriptions  Pending Prescriptions Disp Refills   furosemide (LASIX) 20 MG tablet [Pharmacy Med Name: FUROSEMIDE 20 MG TAB] 90 tablet 3    Sig: TAKE 1 TABLET BY MOUTH EVERY DAY AS NEEDED (SWELLING)      Cardiovascular:  Diuretics - Loop Failed - 06/09/2020  4:02 PM      Failed - Cr in normal range and within 360 days    Creatinine, Ser  Date Value Ref Range Status  10/27/2019 1.21 (H) 0.57 - 1.00 mg/dL Final          Passed - K in normal range and within 360 days    Potassium  Date Value Ref Range Status  10/27/2019 4.3 3.5 - 5.2 mmol/L Final          Passed - Ca in normal range and within 360 days    Calcium  Date Value Ref Range Status  10/27/2019 9.3 8.7 - 10.3 mg/dL Final          Passed - Na in normal range and within 360 days    Sodium  Date Value Ref Range Status  10/27/2019 139 134 - 144 mmol/L Final          Passed - Last BP in normal range    BP Readings from Last 1 Encounters:  04/17/20 (!) 135/58          Passed - Valid encounter within last 6 months    Recent Outpatient Visits           1 month ago Contusion of face, initial encounter   [facility], MD   2 months ago Type 2 diabetes mellitus with other diabetic kidney complication, without long-term current use of insulin (HCC)   [facility] Sherrie Mustache, Demetrios Isaacs, MD   5 months ago Urticaria   [facility] Malva Limes, MD   6 months ago Diarrhea, unspecified type   [facility] Malva Limes, MD   7 months ago Type 2 diabetes mellitus with other diabetic kidney complication, without long-term current use of  insulin [facility])   [facility] Sherrie Mustache, Demetrios Isaacs, MD       Future Appointments             In 1 month Tahiliani, Dolphus Jenny, MD [facility]   In 3 months Fisher, Demetrios Isaacs, MD [facility]

## 2020-06-12 NOTE — Telephone Encounter (Signed)
Notes to clinic from [facility] triage: Furosemide has not been reordered since 01/2018; last Creatinine in 10/2019 was elevated.  Please advise

## 2020-06-20 DIAGNOSIS — R0600 Dyspnea, unspecified: Secondary | ICD-10-CM | POA: Diagnosis not present

## 2020-06-20 DIAGNOSIS — J439 Emphysema, unspecified: Secondary | ICD-10-CM | POA: Diagnosis not present

## 2020-06-20 DIAGNOSIS — J310 Chronic rhinitis: Secondary | ICD-10-CM | POA: Diagnosis not present

## 2020-07-17 ENCOUNTER — Other Ambulatory Visit: Payer: Self-pay

## 2020-07-17 ENCOUNTER — Encounter: Payer: Self-pay | Admitting: Gastroenterology

## 2020-07-17 ENCOUNTER — Ambulatory Visit: Payer: Medicare HMO | Admitting: Gastroenterology

## 2020-07-17 VITALS — BP 128/78 | HR 71 | Temp 97.8°F | Ht 59.0 in

## 2020-07-17 DIAGNOSIS — R195 Other fecal abnormalities: Secondary | ICD-10-CM | POA: Diagnosis not present

## 2020-07-17 NOTE — Progress Notes (Signed)
Vonda Antigua, MD [address]  [place], [place] [address]  Main: [phone]  Fax: [phone]   Primary Care Physician: Birdie Sons, MD   Chief Complaint  Patient presents with  . Diarrhea    HPI: Kayla Boyle is a 86 y.o. female here for follow-up of diarrhea.  Patient denies any diarrhea, except for when she states she eats out or eats exacerbating foods.  Other than this she does not have any diarrhea.  On the days that she knows she will be having dinner out, she uses over-the-counter Imodium which resolves the issue.  No blood in stool.  No weight loss.  No nausea or vomiting.  Previous history: Patient previously seen for positive Cologuard and has refused colonoscopy as documented in previous notes.  Patient reports diarrhea that started December 2020.  Reporting 6 loose to watery bowel movements a day.  Had 1-2 formed bowel movements a day prior to that.  Recent infectious work-up by primary care provider include stool cultures for Salmonella, Shigella, Campylobacter, C. difficile was negative.  No prior history of similar symptoms.  Patient reports subjective weight loss due to this.  States every time she eats she has diarrhea.  Unable to go to church or her appointments due to this.  Primary care provider ordered Cologuard testing which was positive.  No prior colonoscopies.  No family history of colon cancer.  No prior EGDs.  Was previously on Dexilant which is not controlling her reflux.  Reports burning sensation in her chest daily.  Was prescribed Lomotil that she is using 4 times a day and she states this has helped somewhat, but continues to have loose stools daily.  Was also on Metformin, and magnesium supplements.  This were discontinued 2 to 3 weeks ago and patient continues to have diarrhea.  No blood in stool.  Has history of COPD and sees a pulmonologist for it and states her pulmonologist told her that she cannot go under anesthesia  in the past.  Current Outpatient Medications  Medication Sig Dispense Refill  . albuterol (VENTOLIN HFA) 108 (90 Base) MCG/ACT inhaler Inhale 2 puffs into the lungs every 6 (six) hours as needed for wheezing or shortness of breath. 54 g 1  . aspirin 81 MG tablet Take 1 tablet by mouth daily.    Marland Kitchen atenolol-chlorthalidone (TENORETIC) 50-25 MG tablet TAKE ONE TABLET BY MOUTH EACH DAY. 90 tablet 1  . benzonatate (TESSALON) 100 MG capsule TAKE 1 CAPSULE BY MOUTH TWICE A DAY AS NEEDED FOR COUGH 20 capsule 3  . Blood Glucose Monitoring Suppl (ACCU-CHEK COMPACT CARE KIT) KIT As directed 1 each 0  . ciprofloxacin (CILOXAN) 0.3 % ophthalmic solution PLACE 1 DROP INTO BOTH EYES EVERY 4 HOURS WHILE AWAKE FOR 7 DAYS 5 mL 0  . esomeprazole (NEXIUM) 40 MG capsule Take 1 capsule (40 mg total) by mouth daily. 30 capsule 1  . fluticasone (FLONASE) 50 MCG/ACT nasal spray Place 2 sprays into the nose daily.    . furosemide (LASIX) 20 MG tablet TAKE 1 TABLET BY MOUTH EVERY DAY AS NEEDED (SWELLING) 90 tablet 1  . gabapentin (NEURONTIN) 300 MG capsule Take 1 capsule (300 mg total) by mouth 3 (three) times daily. 90 capsule 3  . glucose blood (ACCU-CHEK AVIVA PLUS) test strip Use to check blood sugar once a day 100 each 4  . levocetirizine (XYZAL) 5 MG tablet Take 1 by mouth every morning. 30 tablet 2  . levothyroxine (  SYNTHROID) 75 MCG tablet TAKE 1 TABLET BY MOUTH EVERY DAY 180 tablet 0  . lovastatin (MEVACOR) 40 MG tablet TAKE 1 TABLET BY MOUTH AT BEDTIME 90 tablet 4  . meloxicam (MOBIC) 7.5 MG tablet Take 1 tablet (7.5 mg total) by mouth daily as needed (shoulder pain). 30 tablet 0  . metFORMIN (GLUCOPHAGE) 500 MG tablet Take 1 tablet (500 mg total) by mouth daily. 90 tablet 4  . methocarbamol (ROBAXIN) 500 MG tablet TAKE TWO TABLETS BY MOUTH EVERY 6 HOURS AS NEEDED FOR MUSCLE SPASMS 60 tablet 3  . montelukast (SINGULAIR) 10 MG tablet TAKE 1 TABLET BY MOUTH DAILY 90 tablet 1  . risedronate (ACTONEL) 35 MG tablet  TAKE 1 TABLET BY MOUTH ONCE A WEEK WITH A FULL GLASS OF WATER AND DO NOT LIE BACK DOWN FOR 30 MINUTES AFTER. 12 tablet 4  . traMADol (ULTRAM) 50 MG tablet Take 1-2 tablets (50-100 mg total) by mouth every 8 (eight) hours as needed. 180 tablet 4  . triamcinolone cream (KENALOG) 0.1 % Apply 2 times daily to spot treat affected areas as needed for itch. Avoid face, groin, underarms. 80 g 2  . umeclidinium-vilanterol (ANORO ELLIPTA) 62.5-25 MCG/INH AEPB Inhale 1 puff into the lungs daily.    . vitamin B-12 (CYANOCOBALAMIN) 100 MCG tablet Take 800 mcg by mouth daily.     . Vitamin D, Cholecalciferol, 400 UNITS CHEW Chew 1 tablet by mouth daily.     No current facility-administered medications for this visit.    Allergies as of 07/17/2020 - Review Complete 07/17/2020  Allergen Reaction Noted  . Fosamax [alendronate sodium] Rash 11/11/2017  . Bee venom Hives 01/02/2015  . Cat hair extract Other (See Comments) 12/14/2019  . Codeine Rash and Swelling 11/24/2014  . Penicillins Rash and Swelling 11/24/2014    ROS:  General: Negative for anorexia, weight loss, fever, chills, fatigue, weakness. ENT: Negative for hoarseness, difficulty swallowing , nasal congestion. CV: Negative for chest pain, angina, palpitations, dyspnea on exertion, peripheral edema.  Respiratory: Negative for dyspnea at rest, dyspnea on exertion, cough, sputum, wheezing.  GI: See history of present illness. GU:  Negative for dysuria, hematuria, urinary incontinence, urinary frequency, nocturnal urination.  Endo: Negative for unusual weight change.    Physical Examination:   BP 128/78   Pulse 71   Temp 97.8 F (36.6 C) (Oral)   Ht 4' 11" (1.499 m)   BMI 29.69 kg/m   General: Well-nourished, well-developed in no acute distress.  Eyes: No icterus. Conjunctivae pink. Mouth: Oropharyngeal mucosa moist and pink , no lesions erythema or exudate. Neck: Supple, Trachea midline Abdomen: Bowel sounds are normal, nontender,  nondistended, no hepatosplenomegaly or masses, no abdominal bruits or hernia , no rebound or guarding.   Extremities: No lower extremity edema. No clubbing or deformities. Neuro: Alert and oriented x 3.  Grossly intact. Skin: Warm and dry, no jaundice.   Psych: Alert and cooperative, normal mood and affect.   Labs: CMP     Component Value Date/Time   NA 139 10/27/2019 0940   K 4.3 10/27/2019 0940   CL 104 10/27/2019 0940   CO2 23 10/27/2019 0940   GLUCOSE 93 10/27/2019 0940   BUN 19 10/27/2019 0940   CREATININE 1.21 (H) 10/27/2019 0940   CALCIUM 9.3 10/27/2019 0940   PROT 5.7 (L) 10/27/2019 0940   ALBUMIN 3.6 10/27/2019 0940   AST 12 10/27/2019 0940   ALT 8 10/27/2019 0940   ALKPHOS 73 10/27/2019 0940   BILITOT 0.2  10/27/2019 0940   GFRNONAA 41 (L) 10/27/2019 0940   GFRAA 48 (L) 10/27/2019 0940   Lab Results  Component Value Date   WBC 7.3 03/14/2020   HGB 12.2 03/14/2020   HCT 37.8 03/14/2020   MCV 91 03/14/2020   PLT 284 03/14/2020    Imaging Studies: No results found.  Assessment and Plan:   Kayla Boyle is a 86 y.o. y/o female here for follow-up of diarrhea  Diarrhea has resolved Patient states loose stools only occurs if she eats out and eats foods that do not agree with her.  Other than that, she only has a formed bowel movement daily  Okay to use Imodium once or twice a week as needed.  If she is using more than then, I have advised her to let us know and she verbalized understanding  We again addressed her positive Cologuard and recommended colonoscopy to rule out colon cancer.  However, patient refuses to schedule understands the risk of underlying malignancy if the procedure is not done      Dr Varnita Tahiliani  

## 2020-07-19 ENCOUNTER — Telehealth: Payer: Self-pay | Admitting: Family Medicine

## 2020-07-19 NOTE — Telephone Encounter (Signed)
Pt states she has "thyroid eye" and is asking for an eye drop for this and a script for Tepezza.  Pt learned from this from Dr Alessandra Bevels and she recommend she get this from Dr Sherrie Mustache.  [facility] [place], [place] - [address]

## 2020-07-19 NOTE — Telephone Encounter (Signed)
I called and advised patient as below. Patient states she already has an eye doctor. She wanted to know if there was any eye drops that Dr. Sherrie Mustache could prescribe for "thyroid eye". I advised patient that she should check with her eye doctor regarding a prescription. Patient became angry and told me to just forget about it, she stated "I will have my word with Dr. Sherrie Mustache when I see him in March", then hung up the phone.

## 2020-07-19 NOTE — Telephone Encounter (Signed)
Kayla Boyle is in IV medications with lots of potentially serious side effects. It is only prescribed by ophthalmologists after a thorough eye exam. She needs to talk to her eye doctor, or we can refer her to one if she doesn't already have an eye doctor.

## 2020-07-31 ENCOUNTER — Other Ambulatory Visit: Payer: Self-pay | Admitting: Family Medicine

## 2020-08-10 DIAGNOSIS — H11002 Unspecified pterygium of left eye: Secondary | ICD-10-CM | POA: Diagnosis not present

## 2020-08-30 NOTE — Progress Notes (Signed)
Subjective:   Kayla Boyle is a 86 y.o. female who presents for Medicare Annual (Subsequent) preventive examination.  I connected with Kayla Boyle today by telephone and verified that I am speaking with the correct person using two identifiers. Location patient: home Location provider: work Persons participating in the virtual visit: patient, provider.   I discussed the limitations, risks, security and privacy concerns of performing an evaluation and management service by telephone and the availability of in person appointments. I also discussed with the patient that there may be a patient responsible charge related to this service. The patient expressed understanding and verbally consented to this telephonic visit.    Interactive audio and video telecommunications were attempted between this provider and patient, however failed, due to patient having technical difficulties OR patient did not have access to video capability.  We continued and completed visit with audio only.   Review of Systems    N/A  Cardiac Risk Factors include: advanced age (>11mn, >>37women);diabetes mellitus;dyslipidemia;hypertension     Objective:    Today's Vitals   08/31/20 02248 PainSc: 9    There is no height or weight on file to calculate BMI.  Advanced Directives 08/31/2020 04/04/2020 08/31/2019 05/12/2018 04/17/2017  Does Patient Have a Medical Advance Directive? _0   Type of AParamedicof AWest Roy LakeLiving will - [place]New Smyrna BeachLiving will Living will;Healthcare Power of [place]  Copy of HMurch[place]in Chart? No - copy requested - No - copy requested No - copy requested No - copy requested    Current Medications (verified) Outpatient Encounter Medications as of 08/31/2020  Medication Sig  . albuterol (VENTOLIN HFA) 108 (90 Base) MCG/ACT inhaler Inhale 2 puffs into the lungs every 6 (six)  hours as needed for wheezing or shortness of breath.  .Marland Kitchenaspirin 81 MG tablet Take 1 tablet by mouth daily.  .Marland Kitchenatenolol-chlorthalidone (TENORETIC) 50-25 MG tablet TAKE ONE TABLET BY MOUTH EACH DAY.  . benzonatate (TESSALON) 100 MG capsule TAKE 1 CAPSULE BY MOUTH TWICE A DAY AS NEEDED FOR COUGH  . carboxymethylcellulose (REFRESH PLUS) 0.5 % SOLN Place 1 drop into both eyes in the morning, at noon, in the evening, and at bedtime.  .Marland Kitchenesomeprazole (NEXIUM) 40 MG capsule TAKE 1 CAPSULE BY MOUTH DAILY  . fluorometholone (FML) 0.1 % ophthalmic suspension Place 1 drop into the left eye 2 (two) times daily.  . fluticasone (FLONASE) 50 MCG/ACT nasal spray Place 2 sprays into the nose daily.  . furosemide (LASIX) 20 MG tablet TAKE 1 TABLET BY MOUTH EVERY DAY AS NEEDED (SWELLING)  . gabapentin (NEURONTIN) 300 MG capsule Take 1 capsule (300 mg total) by mouth 3 (three) times daily.  .Marland Kitchenlevothyroxine (SYNTHROID) 75 MCG tablet TAKE 1 TABLET BY MOUTH EVERY DAY  . loratadine (CLARITIN REDITABS) 10 MG dissolvable tablet Take 1 tablet by mouth daily.  .Marland Kitchenlovastatin (MEVACOR) 40 MG tablet TAKE 1 TABLET BY MOUTH AT BEDTIME  . meloxicam (MOBIC) 7.5 MG tablet Take 1 tablet (7.5 mg total) by mouth daily as needed (shoulder pain).  . metFORMIN (GLUCOPHAGE) 500 MG tablet Take 1 tablet (500 mg total) by mouth daily.  . methocarbamol (ROBAXIN) 500 MG tablet TAKE TWO TABLETS BY MOUTH EVERY 6 HOURS AS NEEDED FOR MUSCLE SPASMS  . montelukast (SINGULAIR) 10 MG tablet TAKE 1 TABLET BY MOUTH DAILY  . risedronate (ACTONEL) 35 MG tablet TAKE 1 TABLET BY MOUTH ONCE A  WEEK WITH A FULL GLASS OF WATER AND DO NOT LIE BACK DOWN FOR 30 MINUTES AFTER.  . traMADol (ULTRAM) 50 MG tablet Take 1-2 tablets (50-100 mg total) by mouth every 8 (eight) hours as needed.  . vitamin B-12 (CYANOCOBALAMIN) 100 MCG tablet Take 800 mcg by mouth daily.   . Vitamin D, Cholecalciferol, 400 UNITS CHEW Chew 1,000 Units by mouth daily.  . Blood Glucose Monitoring  Suppl (ACCU-CHEK COMPACT CARE KIT) KIT As directed (Patient not taking: Reported on 08/31/2020)  . ciprofloxacin (CILOXAN) 0.3 % ophthalmic solution PLACE 1 DROP INTO BOTH EYES EVERY 4 HOURS WHILE AWAKE FOR 7 DAYS (Patient not taking: Reported on 08/31/2020)  . glucose blood (ACCU-CHEK AVIVA PLUS) test strip Use to check blood sugar once a day (Patient not taking: Reported on 08/31/2020)  . levocetirizine (XYZAL) 5 MG tablet Take 1 by mouth every morning. (Patient not taking: Reported on 08/31/2020)  . triamcinolone cream (KENALOG) 0.1 % Apply 2 times daily to spot treat affected areas as needed for itch. Avoid face, groin, underarms. (Patient not taking: Reported on 08/31/2020)  . umeclidinium-vilanterol (ANORO ELLIPTA) 62.5-25 MCG/INH AEPB Inhale 1 puff into the lungs daily. (Patient not taking: Reported on 08/31/2020)   No facility-administered encounter medications on file as of 08/31/2020.    Allergies (verified) Fosamax [alendronate sodium], Bee venom, Cat hair extract, Codeine, and Penicillins   History: Past Medical History:  Diagnosis Date  . Allergy   . Arthritis    osteo  . Cataract   . Diabetes mellitus without complication (HCC)    type 2  . H/O acute poliomyelitis 11/24/2014  . History of chicken pox   . Hyperlipidemia   . Hypertension   . Hypothyroidism   . Shingles    Past Surgical History:  Procedure Laterality Date  . ABDOMINAL HYSTERECTOMY  1963  . APPENDECTOMY    . CARPAL TUNNEL RELEASE    . CHOLECYSTECTOMY  10/2003   laproscopic  . ELBOW SURGERY Bilateral   . HAND SURGERY  01/2010  . LEG AMPUTATION BELOW KNEE Right 1975   traumatic; bleow knee and club foot   Family History  Problem Relation Age of Onset  . Pancreatic cancer Father   . Prostate cancer Brother   . Diabetes Brother        type 2  . Hepatitis C Daughter   . HIV/AIDS Son   . Cancer Son        LYMPH NODE CANCER  . Cancer Other 57       small cell cancer  . Glaucoma Sister   . Cancer  Sister        kidney removed   Social History   Socioeconomic History  . Marital status: Widowed    Spouse name: Not on file  . Number of children: 2  . Years of education: 12  . Highest education level: High school graduate  Occupational History  . Occupation: Disabled  . Occupation: retired  Tobacco Use  . Smoking status: Former Smoker    Types: Cigarettes    Quit date: 10/06/2001    Years since quitting: 18.9  . Smokeless tobacco: Never Used  Vaping Use  . Vaping Use: Never used  Substance and Sexual Activity  . Alcohol use: No    Alcohol/week: 0.0 standard drinks  . Drug use: No  . Sexual activity: Not on file  Other Topics Concern  . Not on file  Social History Narrative  . Not on file   Social   Determinants of Health   Financial Resource Strain: Medium Risk  . Difficulty of Paying Living Expenses: Somewhat hard  Food Insecurity: No Food Insecurity  . Worried About Running Out of Food in the Last Year: Never true  . Ran Out of Food in the Last Year: Never true  Transportation Needs: No Transportation Needs  . Lack of Transportation (Medical): No  . Lack of Transportation (Non-Medical): No  Physical Activity: Inactive  . Days of Exercise per Week: 0 days  . Minutes of Exercise per Session: 0 min  Stress: No Stress Concern Present  . Feeling of Stress : Not at all  Social Connections: Moderately Isolated  . Frequency of Communication with Friends and Family: More than three times a week  . Frequency of Social Gatherings with Friends and Family: More than three times a week  . Attends Religious Services: More than 4 times per year  . Active Member of Clubs or Organizations: No  . Attends Club or Organization Meetings: Never  . Marital Status: Widowed    Tobacco Counseling Counseling given: Not Answered   Clinical Intake:  Pre-visit preparation completed: Yes  Pain : 0-10 Pain Score: 9  Pain Type: Chronic pain Pain Location: Back Pain Orientation:  Lower Pain Descriptors / Indicators: Aching Pain Frequency: Constant Pain Relieving Factors: Takes Tramadol as needed for pain.  Pain Relieving Factors: Takes Tramadol as needed for pain.  Nutritional Risks: Nausea/ vomitting/ diarrhea (Diarrhea occasionally due to colitis.) Diabetes: Yes  How often do you need to have someone help you when you read instructions, pamphlets, or other written materials from your doctor or pharmacy?: 1 - Never  Diabetic? Yes  Nutrition Risk Assessment:  Has the patient had any N/V/D within the last 2 months?  No  Does the patient have any non-healing wounds?  No  Has the patient had any unintentional weight loss or weight gain?  No   Diabetes:  Is the patient diabetic?  Yes  If diabetic, was a CBG obtained today?  No  Did the patient bring in their glucometer from home?  No  How often do you monitor your CBG's? Only in office currently.   Financial Strains and Diabetes Management:  Are you having any financial strains with the device, your supplies or your medication? No .  Does the patient want to be seen by Chronic Care Management for management of their diabetes?  No  Would the patient like to be referred to a Nutritionist or for Diabetic Management?  No   Diabetic Exams:  Diabetic Eye Exam: Completed 01/27/20 Diabetic Foot Exam: Overdue, Pt has been advised about the importance in completing this exam.    Interpreter Needed?: No  Information entered by :: Mmarkoski, LPN   Activities of Daily Living In your present state of health, do you have any difficulty performing the following activities: 08/31/2020 04/17/2020  Hearing? N N  Vision? Y N  Comment Due to cataracts and Pterygium. -  Difficulty concentrating or making decisions? N N  Walking or climbing stairs? Y Y  Comment Due to balance issues. -  Dressing or bathing? N Y  Doing errands, shopping? Y N  Comment Does not drive. -  Preparing Food and eating ? N -  Using the  Toilet? N -  In the past six months, have you accidently leaked urine? Y -  Comment Occasionally - had previos bladder lifts. -  Do you have problems with loss of bowel control? N -  Managing your   Medications? N -  Managing your Finances? N -  Housekeeping or managing your Housekeeping? N -  Some recent data might be hidden    Patient Care Team: Fisher, Donald E, MD as PCP - General (Family Medicine) Fleming, Herbon E, MD as Referring Physician (Pulmonary Disease) King, Bradley Mark, MD as Consulting Physician (Ophthalmology) Dodds, Anne, DMD (Dentistry)  Indicate any recent Medical Services you may have received from other than [facility] providers in the past year (date may be approximate).     Assessment:   This is a routine wellness examination for [place].  Hearing/Vision screen No exam data present  Dietary issues and exercise activities discussed: Current Exercise Habits: The patient does not participate in regular exercise at present, Exercise limited by: orthopedic condition(s)  Goals    . Increase water intake     Recommend increasing water intake to 6 glasses of water a day.     . LIFESTYLE - DECREASE FALLS RISK     Recommend to remove any items from the home that may cause slips or trips.         Depression Screen PHQ 2/9 Scores 08/31/2020 04/17/2020 08/31/2019 05/12/2018 05/12/2018 04/17/2017 04/17/2017  PHQ - 2 Score 0 0 0 0 0 0 0  PHQ- 9 Score - - - 8 - 5 -    Fall Risk Fall Risk  08/31/2020 04/17/2020 08/31/2019 05/12/2018 04/17/2017  Falls in the past year? 1 1 0 1 Yes  Number falls in past yr: 0 1 0 0 1  Injury with Fall? 0 1 0 0 No  Risk for fall due to : Impaired balance/gait - - Impaired balance/gait -  Follow up Falls prevention discussed Falls evaluation completed - - Falls prevention discussed    FALL RISK PREVENTION PERTAINING TO THE HOME:  Any stairs in or around the home? Yes  If so, are there any without handrails? No  Home free of loose throw  rugs in walkways, pet beds, electrical cords, etc? Yes  Adequate lighting in your home to reduce risk of falls? Yes   ASSISTIVE DEVICES UTILIZED TO PREVENT FALLS:  Life alert? No  Use of a cane, walker or w/c? Yes  Grab bars in the bathroom? Yes  Shower chair or bench in shower? Yes  Elevated toilet seat or a handicapped toilet? No    Cognitive Function: Normal cognitive status assessed by observation by this Nurse Health Advisor. No abnormalities found.      6CIT Screen 05/12/2018 04/17/2017  What Year? 0 points 0 points  What month? 0 points 0 points  What time? 0 points 0 points  Count back from 20 0 points 0 points  Months in reverse 0 points 0 points  Repeat phrase 0 points 0 points  Total Score 0 0    Immunizations Immunization History  Administered Date(s) Administered  . Fluad Quad(high Dose 65+) 04/13/2019, 03/14/2020  . Influenza, High Dose Seasonal PF 03/23/2015, 04/11/2016, 04/17/2017, 05/12/2018  . Moderna Sars-Covid-2 Vaccination 07/27/2019, 08/24/2019, 06/04/2020  . Pneumococcal Conjugate-13 08/26/2013  . Pneumococcal Polysaccharide-23 06/13/2005  . Td 02/24/2008  . Tdap 03/26/2011  . Zoster 07/19/2010  . Zoster Recombinat (Shingrix) 04/07/2020, 06/09/2020    TDAP status: Up to date  Flu Vaccine status: Up to date  Pneumococcal vaccine status: Up to date  Covid-19 vaccine status: Completed vaccines  Qualifies for Shingles Vaccine? Yes   Zostavax completed Yes   Shingrix Completed?: Yes  Screening Tests Health Maintenance  Topic Date Due  . FOOT EXAM    Never done  . DEXA SCAN  08/31/2021 (Originally 05/27/2018)  . HEMOGLOBIN A1C  09/11/2020  . OPHTHALMOLOGY EXAM  01/26/2021  . TETANUS/TDAP  03/25/2021  . INFLUENZA VACCINE  Completed  . COVID-19 Vaccine  Completed  . PNA vac Low Risk Adult  Completed    Health Maintenance  Health Maintenance Due  Topic Date Due  . FOOT EXAM  Never done    Colorectal cancer screening: No longer  required.   Mammogram status: No longer required due to age.  Bone Density status: Completed 05/27/17. Results reflect: Bone density results: OSTEOPOROSIS. Repeat every 2 years. Declined order today.  Lung Cancer Screening: (Low Dose CT Chest recommended if Age 55-80 years, 30 pack-year currently smoking OR have quit w/in 15years.) does not qualify.   Additional Screening:  Vision Screening: Recommended annual ophthalmology exams for early detection of glaucoma and other disorders of the eye. Is the patient up to date with their annual eye exam?  Yes  Who is the provider or what is the name of the office in which the patient attends annual eye exams? Dr Edison Pace @ [facility] If pt is not established with a provider, would they like to be referred to a provider to establish care? No .   Dental Screening: Recommended annual dental exams for proper oral hygiene  Community Resource Referral / Chronic Care Management: CRR required this visit?  No   CCM required this visit?  No      Plan:     I have personally reviewed and noted the following in the patient's chart:   . Medical and social history . Use of alcohol, tobacco or illicit drugs  . Current medications and supplements . Functional ability and status . Nutritional status . Physical activity . Advanced directives . List of other physicians . Hospitalizations, surgeries, and ER visits in previous 12 months . Vitals . Screenings to include cognitive, depression, and falls . Referrals and appointments  In addition, I have reviewed and discussed with patient certain preventive protocols, quality metrics, and best practice recommendations. A written personalized care plan for preventive services as well as general preventive health recommendations were provided to patient.     Aubreyanna Dorrough [place], [place]   [id number]   Nurse Notes: Pt needs a diabetic foot exam at next in office apt. Pt declined a DEXA scan at this time. Pt would like to  discuss her Nexium prescription and restless legs with PCP at next in office apt.

## 2020-08-31 ENCOUNTER — Ambulatory Visit (INDEPENDENT_AMBULATORY_CARE_PROVIDER_SITE_OTHER): Payer: Medicare HMO

## 2020-08-31 ENCOUNTER — Other Ambulatory Visit: Payer: Self-pay

## 2020-08-31 DIAGNOSIS — Z0000 Encounter for general adult medical examination without abnormal findings: Secondary | ICD-10-CM

## 2020-08-31 NOTE — Patient Instructions (Signed)
Kayla Boyle , Thank you for taking time to come for your Medicare Wellness Visit. I appreciate your ongoing commitment to your health goals. Please review the following plan we discussed and let me know if I can assist you in the future.   Screening recommendations/referrals: Colonoscopy: No longer required.  Mammogram: No longer required.  Bone Density: Currently due, declined order today.  Recommended yearly ophthalmology/optometry visit for glaucoma screening and checkup Recommended yearly dental visit for hygiene and checkup  Vaccinations: Influenza vaccine: Done 03/14/20 Pneumococcal vaccine: Completed series Tdap vaccine: Up to date, due 03/2021 Shingles vaccine: Completed series    Advanced directives: Please bring a copy of your POA (Power of Attorney) and/or Living Will to your next appointment.   Conditions/risks identified: Fall risk preventatives discussed today. Recommend increasing water intake to 6 glasses of water a day.   Next appointment: 09/11/20 @ 8:00 AM with Dr Sherrie Mustache    Preventive Care 65 Years and Older, Female Preventive care refers to lifestyle choices and visits with your health care provider that can promote health and wellness. What does preventive care include?  A yearly physical exam. This is also called an annual well check.  Dental exams once or twice a year.  Routine eye exams. Ask your health care provider how often you should have your eyes checked.  Personal lifestyle choices, including:  Daily care of your teeth and gums.  Regular physical activity.  Eating a healthy diet.  Avoiding tobacco and drug use.  Limiting alcohol use.  Practicing safe sex.  Taking low-dose aspirin every day.  Taking vitamin and mineral supplements as recommended by your health care provider. What happens during an annual well check? The services and screenings done by your health care provider during your annual well check will depend on your age, overall  health, lifestyle risk factors, and family history of disease. Counseling  Your health care provider may ask you questions about your:  Alcohol use.  Tobacco use.  Drug use.  Emotional well-being.  Home and relationship well-being.  Sexual activity.  Eating habits.  History of falls.  Memory and ability to understand (cognition).  Work and work Astronomer.  Reproductive health. Screening  You may have the following tests or measurements:  Height, weight, and BMI.  Blood pressure.  Lipid and cholesterol levels. These may be checked every 5 years, or more frequently if you are over 64 years old.  Skin check.  Lung cancer screening. You may have this screening every year starting at age 79 if you have a 30-pack-year history of smoking and currently smoke or have quit within the past 15 years.  Fecal occult blood test (FOBT) of the stool. You may have this test every year starting at age 84.  Flexible sigmoidoscopy or colonoscopy. You may have a sigmoidoscopy every 5 years or a colonoscopy every 10 years starting at age 45.  Hepatitis C blood test.  Hepatitis B blood test.  Sexually transmitted disease (STD) testing.  Diabetes screening. This is done by checking your blood sugar (glucose) after you have not eaten for a while (fasting). You may have this done every 1-3 years.  Bone density scan. This is done to screen for osteoporosis. You may have this done starting at age 85.  Mammogram. This may be done every 1-2 years. Talk to your health care provider about how often you should have regular mammograms. Talk with your health care provider about your test results, treatment options, and if necessary, the need  for more tests. Vaccines  Your health care provider may recommend certain vaccines, such as:  Influenza vaccine. This is recommended every year.  Tetanus, diphtheria, and acellular pertussis (Tdap, Td) vaccine. You may need a Td booster every 10  years.  Zoster vaccine. You may need this after age 31.  Pneumococcal 13-valent conjugate (PCV13) vaccine. One dose is recommended after age 84.  Pneumococcal polysaccharide (PPSV23) vaccine. One dose is recommended after age 53. Talk to your health care provider about which screenings and vaccines you need and how often you need them. This information is not intended to replace advice given to you by your health care provider. Make sure you discuss any questions you have with your health care provider. Document Released: 07/21/2015 Document Revised: 03/13/2016 Document Reviewed: 04/25/2015 Elsevier Interactive Patient Education  2017 [place] Prevention in the Home Falls can cause injuries. They can happen to people of all ages. There are many things you can do to make your home safe and to help prevent falls. What can I do on the outside of my home?  Regularly fix the edges of walkways and driveways and fix any cracks.  Remove anything that might make you trip as you walk through a door, such as a raised step or threshold.  Trim any bushes or trees on the path to your home.  Use bright outdoor lighting.  Clear any walking paths of anything that might make someone trip, such as rocks or tools.  Regularly check to see if handrails are loose or broken. Make sure that both sides of any steps have handrails.  Any raised decks and porches should have guardrails on the edges.  Have any leaves, snow, or ice cleared regularly.  Use sand or salt on walking paths during winter.  Clean up any spills in your garage right away. This includes oil or grease spills. What can I do in the bathroom?  Use night lights.  Install grab bars by the toilet and in the tub and shower. Do not use towel bars as grab bars.  Use non-skid mats or decals in the tub or shower.  If you need to sit down in the shower, use a plastic, non-slip stool.  Keep the floor dry. Clean up any water that  spills on the floor as soon as it happens.  Remove soap buildup in the tub or shower regularly.  Attach bath mats securely with double-sided non-slip rug tape.  Do not have throw rugs and other things on the floor that can make you trip. What can I do in the bedroom?  Use night lights.  Make sure that you have a light by your bed that is easy to reach.  Do not use any sheets or blankets that are too big for your bed. They should not hang down onto the floor.  Have a firm chair that has side arms. You can use this for support while you get dressed.  Do not have throw rugs and other things on the floor that can make you trip. What can I do in the kitchen?  Clean up any spills right away.  Avoid walking on wet floors.  Keep items that you use a lot in easy-to-reach places.  If you need to reach something above you, use a strong step stool that has a grab bar.  Keep electrical cords out of the way.  Do not use floor polish or wax that makes floors slippery. If you must use wax, use  non-skid floor wax.  Do not have throw rugs and other things on the floor that can make you trip. What can I do with my stairs?  Do not leave any items on the stairs.  Make sure that there are handrails on both sides of the stairs and use them. Fix handrails that are broken or loose. Make sure that handrails are as long as the stairways.  Check any carpeting to make sure that it is firmly attached to the stairs. Fix any carpet that is loose or worn.  Avoid having throw rugs at the top or bottom of the stairs. If you do have throw rugs, attach them to the floor with carpet tape.  Make sure that you have a light switch at the top of the stairs and the bottom of the stairs. If you do not have them, ask someone to add them for you. What else can I do to help prevent falls?  Wear shoes that:  Do not have high heels.  Have rubber bottoms.  Are comfortable and fit you well.  Are closed at the  toe. Do not wear sandals.  If you use a stepladder:  Make sure that it is fully opened. Do not climb a closed stepladder.  Make sure that both sides of the stepladder are locked into place.  Ask someone to hold it for you, if possible.  Clearly mark and make sure that you can see:  Any grab bars or handrails.  First and last steps.  Where the edge of each step is.  Use tools that help you move around (mobility aids) if they are needed. These include:  Canes.  Walkers.  Scooters.  Crutches.  Turn on the lights when you go into a dark area. Replace any light bulbs as soon as they burn out.  Set up your furniture so you have a clear path. Avoid moving your furniture around.  If any of your floors are uneven, fix them.  If there are any pets around you, be aware of where they are.  Review your medicines with your doctor. Some medicines can make you feel dizzy. This can increase your chance of falling. Ask your doctor what other things that you can do to help prevent falls. This information is not intended to replace advice given to you by your health care provider. Make sure you discuss any questions you have with your health care provider. Document Released: 04/20/2009 Document Revised: 11/30/2015 Document Reviewed: 07/29/2014 Elsevier Interactive Patient Education  2017 [employer].

## 2020-09-11 ENCOUNTER — Ambulatory Visit: Payer: Self-pay | Admitting: Family Medicine

## 2020-09-22 ENCOUNTER — Other Ambulatory Visit: Payer: Self-pay

## 2020-09-22 ENCOUNTER — Encounter: Payer: Self-pay | Admitting: Family Medicine

## 2020-09-22 ENCOUNTER — Ambulatory Visit (INDEPENDENT_AMBULATORY_CARE_PROVIDER_SITE_OTHER): Payer: Medicare HMO | Admitting: Family Medicine

## 2020-09-22 VITALS — BP 129/90 | HR 65 | Temp 97.9°F | Resp 18

## 2020-09-22 DIAGNOSIS — Z23 Encounter for immunization: Secondary | ICD-10-CM

## 2020-09-22 DIAGNOSIS — K529 Noninfective gastroenteritis and colitis, unspecified: Secondary | ICD-10-CM

## 2020-09-22 DIAGNOSIS — E039 Hypothyroidism, unspecified: Secondary | ICD-10-CM

## 2020-09-22 DIAGNOSIS — K219 Gastro-esophageal reflux disease without esophagitis: Secondary | ICD-10-CM

## 2020-09-22 DIAGNOSIS — E559 Vitamin D deficiency, unspecified: Secondary | ICD-10-CM | POA: Diagnosis not present

## 2020-09-22 DIAGNOSIS — E1129 Type 2 diabetes mellitus with other diabetic kidney complication: Secondary | ICD-10-CM | POA: Diagnosis not present

## 2020-09-22 DIAGNOSIS — I10 Essential (primary) hypertension: Secondary | ICD-10-CM | POA: Diagnosis not present

## 2020-09-22 DIAGNOSIS — E785 Hyperlipidemia, unspecified: Secondary | ICD-10-CM

## 2020-09-22 MED ORDER — GLIPIZIDE ER 2.5 MG PO TB24
2.5000 mg | ORAL_TABLET | Freq: Every day | ORAL | 5 refills | Status: AC
Start: 2020-09-22 — End: ?

## 2020-09-22 NOTE — Progress Notes (Signed)
I,April Miller,acting as a scribe for Lelon Huh, MD.,have documented all relevant documentation on the behalf of Lelon Huh, MD,as directed by  Lelon Huh, MD while in the presence of Lelon Huh, MD.  Established patient visit   Patient: Kayla Boyle   DOB: [birth year]   86 y.o. Female  MRN: [id number] Visit Date: 09/22/2020  Today's healthcare provider: Lelon Huh, MD   Chief Complaint  Patient presents with  . Follow-up  . Diabetes  . Hypertension   Subjective    HPI  Diabetes Mellitus Type II, follow-up  Lab Results  Component Value Date   HGBA1C 6.1 (A) 03/14/2020   HGBA1C 5.6 10/27/2019   HGBA1C 6.1 (A) 05/12/2018   Last seen for diabetes 6 months ago.  Management since then includes; Well controlled.  Continue current medications.  She reports good compliance with treatment. She is not having side effects. none  Home blood sugar records: fasting range: not checking  Episodes of hypoglycemia? No none   Current insulin regiment: n/a Most Recent Eye Exam: 01/27/2020  --------------------------------------------------------------------  Hypertension, follow-up  BP Readings from Last 3 Encounters:  09/22/20 129/90  07/17/20 128/78  04/17/20 (!) 135/58   Wt Readings from Last 3 Encounters:  04/17/20 147 lb (66.7 kg)  04/04/20 147 lb (66.7 kg)  05/20/17 160 lb (72.6 kg)     She was last seen for hypertension 6 months ago.  BP at that visit was 124/76. Management since that visit includes; Well controlled.  Continue current medications. She reports good compliance with treatment. She is not having side effects. none She is not exercising. She is adherent to low salt diet.   Outside blood pressures are not checking.  She does not smoke.  Use of agents associated with hypertension: none.   --------------------------------------------------------------------  She also states she has started to have frequent loose bowel movements  again. She had this last year and referred to GI where she had normal work up and thought that it may be related to her thyroid which has since been corrected. Eventually it seemed to resolve on its own, but started back up a month or so ago. She is taking Align every day which doesn't seem to help. She also complains of frequent need to belch which she thought was related to GERD, but not having any heartburn or regurgitation. She does feel like metformin exacerbates loose bowels.      Medications: Outpatient Medications Prior to Visit  Medication Sig  . albuterol (VENTOLIN HFA) 108 (90 Base) MCG/ACT inhaler Inhale 2 puffs into the lungs every 6 (six) hours as needed for wheezing or shortness of breath.  Marland Kitchen aspirin 81 MG tablet Take 1 tablet by mouth daily.  Marland Kitchen atenolol-chlorthalidone (TENORETIC) 50-25 MG tablet TAKE ONE TABLET BY MOUTH EACH DAY.  . benzonatate (TESSALON) 100 MG capsule TAKE 1 CAPSULE BY MOUTH TWICE A DAY AS NEEDED FOR COUGH  . carboxymethylcellulose (REFRESH PLUS) 0.5 % SOLN Place 1 drop into both eyes in the morning, at noon, in the evening, and at bedtime.  Marland Kitchen esomeprazole (NEXIUM) 40 MG capsule TAKE 1 CAPSULE BY MOUTH DAILY  . fluorometholone (FML) 0.1 % ophthalmic suspension Place 1 drop into the left eye 2 (two) times daily.  . fluticasone (FLONASE) 50 MCG/ACT nasal spray Place 2 sprays into the nose daily.  . furosemide (LASIX) 20 MG tablet TAKE 1 TABLET BY MOUTH EVERY DAY AS NEEDED (SWELLING)  . gabapentin (NEURONTIN) 300 MG capsule Take 1 capsule (  300 mg total) by mouth 3 (three) times daily.  Marland Kitchen levothyroxine (SYNTHROID) 75 MCG tablet TAKE 1 TABLET BY MOUTH EVERY DAY  . loratadine (CLARITIN REDITABS) 10 MG dissolvable tablet Take 1 tablet by mouth daily.  Marland Kitchen lovastatin (MEVACOR) 40 MG tablet TAKE 1 TABLET BY MOUTH AT BEDTIME  . meloxicam (MOBIC) 7.5 MG tablet Take 1 tablet (7.5 mg total) by mouth daily as needed (shoulder pain).  . metFORMIN (GLUCOPHAGE) 500 MG tablet Take  1 tablet (500 mg total) by mouth daily.  . methocarbamol (ROBAXIN) 500 MG tablet TAKE TWO TABLETS BY MOUTH EVERY 6 HOURS AS NEEDED FOR MUSCLE SPASMS  . montelukast (SINGULAIR) 10 MG tablet TAKE 1 TABLET BY MOUTH DAILY  . risedronate (ACTONEL) 35 MG tablet TAKE 1 TABLET BY MOUTH ONCE A WEEK WITH A FULL GLASS OF WATER AND DO NOT LIE BACK DOWN FOR 30 MINUTES AFTER.  . traMADol (ULTRAM) 50 MG tablet Take 1-2 tablets (50-100 mg total) by mouth every 8 (eight) hours as needed.  . vitamin B-12 (CYANOCOBALAMIN) 100 MCG tablet Take 800 mcg by mouth daily.   . Vitamin D, Cholecalciferol, 400 UNITS CHEW Chew 1,000 Units by mouth daily.  . Blood Glucose Monitoring Suppl (ACCU-CHEK COMPACT CARE KIT) KIT As directed (Patient not taking: No sig reported)  . ciprofloxacin (CILOXAN) 0.3 % ophthalmic solution PLACE 1 DROP INTO BOTH EYES EVERY 4 HOURS WHILE AWAKE FOR 7 DAYS (Patient not taking: No sig reported)  . glucose blood (ACCU-CHEK AVIVA PLUS) test strip Use to check blood sugar once a day (Patient not taking: No sig reported)  . levocetirizine (XYZAL) 5 MG tablet Take 1 by mouth every morning. (Patient not taking: No sig reported)  . triamcinolone cream (KENALOG) 0.1 % Apply 2 times daily to spot treat affected areas as needed for itch. Avoid face, groin, underarms. (Patient not taking: No sig reported)  . umeclidinium-vilanterol (ANORO ELLIPTA) 62.5-25 MCG/INH AEPB Inhale 1 puff into the lungs daily. (Patient not taking: No sig reported)   No facility-administered medications prior to visit.    Review of Systems  Constitutional: Negative for appetite change, chills, fatigue and fever.  Respiratory: Negative for chest tightness and shortness of breath.   Cardiovascular: Negative for chest pain and palpitations.  Gastrointestinal: Negative for abdominal pain, nausea and vomiting.  Neurological: Negative for dizziness and weakness.       Objective    BP 129/90 (BP Location: Left Arm, Patient Position:  Sitting, Cuff Size: Large)   Pulse 65   Temp 97.9 F (36.6 C) (Oral)   Resp 18   SpO2 96%     Physical Exam    General: Appearance:     Overweight female in no acute distress  Eyes:    PERRL, conjunctiva/corneas clear, EOM's intact       Lungs:     Clear to auscultation bilaterally, respirations unlabored  Heart:    Normal heart rate. Normal rhythm. No murmurs, rubs, or gallops.   MS:   Below knee amputation of right lower extremity is noted.   Neurologic:   Awake, alert, oriented x 3. No apparent focal neurological           defect.         Assessment & Plan     1. Type 2 diabetes mellitus with other diabetic kidney complication, without long-term current use of insulin (HCC)  - Hemoglobin A1c  Put metformin on hold since she having loose stools again. start- glipiZIDE (GLUCOTROL XL) 2.5 MG  24 hr tablet; Take 1 tablet (2.5 mg total) by mouth daily with breakfast.  Dispense: 30 tablet; Refill: 5  She did take glipizide last year but developed unexplained that did not immediately clear when it was discontinued. Rash has since cleared. She is to let me know if rash returns after restarting glipizide.   2. Chronic diarrhea Continue Align every day. Put metformin on hold for the time being.   3. Hypothyroidism, unspecified type  - TSH  4. Essential hypertension   5. Vitamin D deficiency  - VITAMIN D 25 Hydroxy (Vit-D Deficiency, Fractures)  6. Gastroesophageal reflux disease, unspecified whether esophagitis present Continue pantoprazole. This in not likely related to belching and advised to try OTC simethacone.   7. Hyperlipidemia, unspecified hyperlipidemia type She is tolerating lovastatin well with no adverse effects.   - CBC - Comprehensive metabolic panel - Lipid panel  8. Hypomagnesemia  - Magnesium  9. Need for pneumococcal vaccine  - Pneumococcal polysaccharide vaccine 23-valent greater than or equal to 2yo subcutaneous/IM        The entirety of the  information documented in the History of Present Illness, Review of Systems and Physical Exam were personally obtained by me. Portions of this information were initially documented by the CMA and reviewed by me for thoroughness and accuracy.      Lelon Huh, MD  [facility] [phone] (phone) [phone] (fax)  [place]

## 2020-09-22 NOTE — Patient Instructions (Addendum)
.   Try OTC Gax-X or generic simethicone to help with gas and belching   Try OTC nasal saline spray or irrigation to help with sinus congestion. Continue using OTC Flonase for allergies.

## 2020-09-23 LAB — LIPID PANEL
Chol/HDL Ratio: 2.8 ratio (ref 0.0–4.4)
Cholesterol, Total: 204 mg/dL — ABNORMAL HIGH (ref 100–199)
HDL: 74 mg/dL (ref >39)
LDL Chol Calc (NIH): 104 mg/dL — ABNORMAL HIGH (ref 0–99)
Triglycerides: 154 mg/dL — ABNORMAL HIGH (ref 0–149)
VLDL Cholesterol Cal: 26 mg/dL (ref 5–40)

## 2020-09-23 LAB — COMPREHENSIVE METABOLIC PANEL
ALT: 10 IU/L (ref 0–32)
AST: 17 IU/L (ref 0–40)
Albumin/Globulin Ratio: 2 (ref 1.2–2.2)
Albumin: 4.5 g/dL (ref 3.6–4.6)
Alkaline Phosphatase: 67 IU/L (ref 44–121)
BUN/Creatinine Ratio: 19 (ref 12–28)
BUN: 23 mg/dL (ref 8–27)
Bilirubin Total: 0.4 mg/dL (ref 0.0–1.2)
CO2: 26 mmol/L (ref 20–29)
Calcium: 10.2 mg/dL (ref 8.7–10.3)
Chloride: 98 mmol/L (ref 96–106)
Creatinine, Ser: 1.22 mg/dL — ABNORMAL HIGH (ref 0.57–1.00)
Globulin, Total: 2.2 g/dL (ref 1.5–4.5)
Glucose: 96 mg/dL (ref 65–99)
Potassium: 4.2 mmol/L (ref 3.5–5.2)
Sodium: 139 mmol/L (ref 134–144)
Total Protein: 6.7 g/dL (ref 6.0–8.5)
eGFR: 44 mL/min/{1.73_m2} — ABNORMAL LOW (ref >59)

## 2020-09-23 LAB — VITAMIN D 25 HYDROXY (VIT D DEFICIENCY, FRACTURES): Vit D, 25-Hydroxy: 41.3 ng/mL (ref 30.0–100.0)

## 2020-09-23 LAB — HEMOGLOBIN A1C
Est. average glucose Bld gHb Est-mCnc: 148 mg/dL
Hgb A1c MFr Bld: 6.8 % — ABNORMAL HIGH (ref 4.8–5.6)

## 2020-09-23 LAB — CBC
Hematocrit: 36.8 % (ref 34.0–46.6)
Hemoglobin: 12.4 g/dL (ref 11.1–15.9)
MCH: 30.2 pg (ref 26.6–33.0)
MCHC: 33.7 g/dL (ref 31.5–35.7)
MCV: 90 fL (ref 79–97)
Platelets: 317 10*3/uL (ref 150–450)
RBC: 4.1 x10E6/uL (ref 3.77–5.28)
RDW: 12.8 % (ref 11.7–15.4)
WBC: 8.4 10*3/uL (ref 3.4–10.8)

## 2020-09-23 LAB — TSH: TSH: 0.644 u[IU]/mL (ref 0.450–4.500)

## 2020-09-23 LAB — MAGNESIUM: Magnesium: 1.4 mg/dL — ABNORMAL LOW (ref 1.6–2.3)

## 2020-09-25 ENCOUNTER — Telehealth: Payer: Self-pay

## 2020-09-25 DIAGNOSIS — E1129 Type 2 diabetes mellitus with other diabetic kidney complication: Secondary | ICD-10-CM

## 2020-09-25 MED ORDER — ACCU-CHEK AVIVA PLUS W/DEVICE KIT: PACK | 0 refills | Status: DC

## 2020-09-25 MED ORDER — ACCU-CHEK SOFTCLIX LANCETS MISC: 4 refills | Status: AC

## 2020-09-25 MED ORDER — BENZONATATE 100 MG PO CAPS: ORAL_CAPSULE | ORAL | 3 refills | Status: AC

## 2020-09-25 MED ORDER — ACCU-CHEK AVIVA PLUS VI STRP: ORAL_STRIP | 4 refills | Status: DC

## 2020-09-25 MED ORDER — TRAMADOL HCL 50 MG PO TABS: 50.0000 mg | ORAL_TABLET | Freq: Three times a day (TID) | ORAL | 4 refills | Status: AC | PRN

## 2020-09-25 MED ORDER — METHOCARBAMOL 500 MG PO TABS: ORAL_TABLET | ORAL | 3 refills | Status: AC

## 2020-09-25 MED ORDER — GABAPENTIN 300 MG PO CAPS: 300.0000 mg | ORAL_CAPSULE | Freq: Three times a day (TID) | ORAL | 3 refills | Status: AC

## 2020-09-25 MED ORDER — FUROSEMIDE 20 MG PO TABS: ORAL_TABLET | ORAL | 1 refills | Status: AC

## 2020-09-25 NOTE — Telephone Encounter (Signed)
-----   Message from Malva Limes, MD sent at 09/25/2020  6:57 AM EDT ----- A1c is good at 6.8. magnesium level is low. This could cause trouble with legs cramping. Rest of labs are good.  Need to start magnesium Oxide 500mg  one tablet every day.  Need to schedule follow up diabetes in 4-5 months.

## 2020-09-25 NOTE — Addendum Note (Signed)
Addended by: Malva Limes on: 09/25/2020 05:05 PM   Modules accepted: Orders

## 2020-09-25 NOTE — Telephone Encounter (Signed)
Copied from CRM [id number]. Topic: General - Other >> Sep 25, 2020 11:21 AM Gaetana Michaelis A wrote: Reason for CRM: Patient has concerns regarding multiple medications and would like to be contacted by a member of staff regarding the following  esomeprazole (NEXIUM) 40 MG capsule - patient shares that the medication is ineffective and would like to be prescribed an alternative  furosemide (LASIX) 20 MG tablet  benzonatate (TESSALON) 100 MG capsule  ranitidine (ZANTAC) 300 MG tablet - patient would like to be prescribed an alternative  traMADol (ULTRAM) 50 MG tablet  gabapentin (NEURONTIN) 100 MG capsule  Patient also has concerns regarding other medications, but had difficulty remembering the names of them/locating old bottles  Please contact to advise further / Patient was last seen in office 09/22/20

## 2020-09-25 NOTE — Telephone Encounter (Signed)
I called pt and pt verbalized understanding. Pt was upset about her medications not being refilled. Pt wants me to inform Dr. Sherrie Mustache that she needs diabetic supplies and refills on a few medications.

## 2020-09-26 NOTE — Telephone Encounter (Signed)
Lupita Leash, from pharmacy, calling on behalf of pt. She states that the prescriptions sent in were Accucheck Aviva, that is discontinued. She states that this has been replaced with the accucheck Guide. She states that she is needing to have new prescriptions sent in for pt. Please advise.       [facility] [address] [place], [place] - [address]  [address] [place] [address]  Phone: [phone] Fax: [phone]  Hours: Not open 24 hours

## 2020-09-28 MED ORDER — ACCU-CHEK GUIDE VI STRP: ORAL_STRIP | 12 refills | Status: AC

## 2020-09-28 MED ORDER — ACCU-CHEK GUIDE ME W/DEVICE KIT: PACK | 0 refills | Status: AC

## 2020-09-28 NOTE — Telephone Encounter (Signed)
I called patient's pharmacy and gave verbal ok to change glucose meter and strips to one that was available and covered by insurance. I was advised by pharmacist that Accucheck guide me meter and accucheck guide lancets were available and covered by patients insurance.

## 2020-09-28 NOTE — Addendum Note (Signed)
Addended by: Benjiman Core on: 09/28/2020 03:56 PM   Modules accepted: Orders

## 2020-09-28 NOTE — Telephone Encounter (Signed)
Pt called upset because her diabetes kit has not been sent to the pharmacy [facility])  She said she can not check her glucose with out a new kit.  Please contact patient or send in a new kit asap.  Per patient  CB#  [phone]

## 2020-11-04 ENCOUNTER — Other Ambulatory Visit: Payer: Self-pay | Admitting: Family Medicine

## 2020-11-04 DIAGNOSIS — M810 Age-related osteoporosis without current pathological fracture: Secondary | ICD-10-CM

## 2020-11-04 NOTE — Telephone Encounter (Signed)
Requested Prescriptions  Pending Prescriptions Disp Refills  . risedronate (ACTONEL) 35 MG tablet [Pharmacy Med Name: RISEDRONATE SODIUM 35 MG TAB] 12 tablet 4    Sig: TAKE 1 TABLET BY MOUTH ONCE A WEEK WITH A FULL GLASS OF WATER AND DO NOT LIE BACK DOWN FOR 30 MINUTES AFTER     Endocrinology:  Bisphosphonates Passed - 11/04/2020  9:26 AM      Passed - Ca in normal range and within 360 days    Calcium  Date Value Ref Range Status  09/22/2020 10.2 8.7 - 10.3 mg/dL Final         Passed - Vitamin D in normal range and within 360 days    Vit D, 25-Hydroxy  Date Value Ref Range Status  09/22/2020 41.3 30.0 - 100.0 ng/mL Final    Comment:    Vitamin D deficiency has been defined by the Institute of Medicine and an Endocrine Society practice guideline as a level of serum 25-OH vitamin D less than 20 ng/mL (1,2). The Endocrine Society went on to further define vitamin D insufficiency as a level between 21 and 29 ng/mL (2). 1. IOM (Institute of Medicine). 2010. Dietary reference    intakes for calcium and D. [place]: The    [employer]. 2. Holick MF, Binkley As[place], Bischoff-Ferrari HA, et al.    Evaluation, treatment, and prevention of vitamin D    deficiency: an Endocrine Society clinical practice    guideline. JCEM. 2011 Jul; 96(7):1911-30.          Passed - Valid encounter within last 12 months    Recent Outpatient Visits          1 month ago Type 2 diabetes mellitus with other diabetic kidney complication, without long-term current use of insulin (HCC)   [facility] Malva Limes, MD   6 months ago Contusion of face, initial encounter   [facility] Malva Limes, MD   7 months ago Type 2 diabetes mellitus with other diabetic kidney complication, without long-term current use of insulin [facility])   [facility] Malva Limes, MD   10 months ago Urticaria   [facility] Malva Limes, MD   11 months  ago Diarrhea, unspecified type   [facility] Malva Limes, MD      Future Appointments            In 2 months Pasty Spillers, MD [facility] [place]   In 3 months Fisher, Demetrios Isaacs, MD [facility]

## 2020-11-18 ENCOUNTER — Telehealth: Payer: Self-pay | Admitting: Family Medicine

## 2020-11-18 DIAGNOSIS — K580 Irritable bowel syndrome with diarrhea: Secondary | ICD-10-CM

## 2020-11-21 ENCOUNTER — Other Ambulatory Visit: Payer: Self-pay | Admitting: Family Medicine

## 2020-11-21 ENCOUNTER — Telehealth: Payer: Self-pay | Admitting: Family Medicine

## 2020-11-21 DIAGNOSIS — R197 Diarrhea, unspecified: Secondary | ICD-10-CM

## 2020-11-21 DIAGNOSIS — K580 Irritable bowel syndrome with diarrhea: Secondary | ICD-10-CM

## 2020-11-21 MED ORDER — DICYCLOMINE HCL 10 MG PO CAPS
10.0000 mg | ORAL_CAPSULE | Freq: Three times a day (TID) | ORAL | 3 refills | Status: AC
Start: 2020-11-21 — End: ?

## 2020-11-21 NOTE — Telephone Encounter (Signed)
Please advise on refill request. This medication is not listed on medication list.

## 2020-11-21 NOTE — Telephone Encounter (Signed)
Patient checking on the status of dicyclomine (BENTYL) 10 MG capsule and would like to know why medication wasn't filled. Patient would like a follow up call today. Patient states her bowels have been irritable in the last 6 weeks and she is about to run out of her medication   [facility] [place], [place] - [address] Phone:  [phone]  Fax:  [phone]

## 2020-11-21 NOTE — Telephone Encounter (Signed)
Patient requesting diphenoxylate-atropine (lomotil) 2.5-0.025 mg tablet and states it was denied by PCP (chart does not reflect medication) patient would like script sent in today and a follow up call  [facility] [place], [place] - [address] Phone:  [phone]  Fax:  [phone]

## 2020-12-09 ENCOUNTER — Other Ambulatory Visit: Payer: Self-pay | Admitting: Family Medicine

## 2020-12-09 DIAGNOSIS — H1013 Acute atopic conjunctivitis, bilateral: Secondary | ICD-10-CM

## 2020-12-09 NOTE — Telephone Encounter (Signed)
Requested Prescriptions  Pending Prescriptions Disp Refills  . montelukast (SINGULAIR) 10 MG tablet [Pharmacy Med Name: MONTELUKAST SODIUM 10 MG TAB] 90 tablet 0    Sig: TAKE 1 TABLET BY MOUTH DAILY     Pulmonology:  Leukotriene Inhibitors Passed - 12/09/2020  9:37 AM      Passed - Valid encounter within last 12 months    Recent Outpatient Visits          2 months ago Type 2 diabetes mellitus with other diabetic kidney complication, without long-term current use of insulin (HCC)   [facility] Malva Limes, MD   7 months ago Contusion of face, initial encounter   [facility] Malva Limes, MD   9 months ago Type 2 diabetes mellitus with other diabetic kidney complication, without long-term current use of insulin [facility])   [facility] Malva Limes, MD   11 months ago Urticaria   [facility] Malva Limes, MD   1 year ago Diarrhea, unspecified type   [facility] Malva Limes, MD      Future Appointments            In 1 month Maximino Greenland, Dolphus Jenny, MD [facility]   In 2 months Fisher, Demetrios Isaacs, MD [facility]

## 2020-12-19 DIAGNOSIS — R0600 Dyspnea, unspecified: Secondary | ICD-10-CM | POA: Diagnosis not present

## 2020-12-19 DIAGNOSIS — J310 Chronic rhinitis: Secondary | ICD-10-CM | POA: Diagnosis not present

## 2020-12-19 DIAGNOSIS — J439 Emphysema, unspecified: Secondary | ICD-10-CM | POA: Diagnosis not present

## 2021-01-09 DIAGNOSIS — E119 Type 2 diabetes mellitus without complications: Secondary | ICD-10-CM | POA: Diagnosis not present

## 2021-01-09 DIAGNOSIS — R7309 Other abnormal glucose: Secondary | ICD-10-CM | POA: Diagnosis not present

## 2021-01-09 DIAGNOSIS — R8290 Unspecified abnormal findings in urine: Secondary | ICD-10-CM | POA: Diagnosis not present

## 2021-01-09 DIAGNOSIS — K219 Gastro-esophageal reflux disease without esophagitis: Secondary | ICD-10-CM | POA: Diagnosis not present

## 2021-01-09 DIAGNOSIS — E039 Hypothyroidism, unspecified: Secondary | ICD-10-CM | POA: Diagnosis not present

## 2021-01-09 DIAGNOSIS — Z89511 Acquired absence of right leg below knee: Secondary | ICD-10-CM | POA: Diagnosis not present

## 2021-01-09 DIAGNOSIS — J449 Chronic obstructive pulmonary disease, unspecified: Secondary | ICD-10-CM | POA: Diagnosis not present

## 2021-01-09 DIAGNOSIS — M545 Low back pain, unspecified: Secondary | ICD-10-CM | POA: Diagnosis not present

## 2021-01-09 DIAGNOSIS — G8929 Other chronic pain: Secondary | ICD-10-CM | POA: Diagnosis not present

## 2021-01-09 DIAGNOSIS — L299 Pruritus, unspecified: Secondary | ICD-10-CM | POA: Diagnosis not present

## 2021-01-09 DIAGNOSIS — M810 Age-related osteoporosis without current pathological fracture: Secondary | ICD-10-CM | POA: Diagnosis not present

## 2021-01-09 DIAGNOSIS — Z7689 Persons encountering health services in other specified circumstances: Secondary | ICD-10-CM | POA: Diagnosis not present

## 2021-01-09 DIAGNOSIS — L853 Xerosis cutis: Secondary | ICD-10-CM | POA: Diagnosis not present

## 2021-01-09 DIAGNOSIS — Z0000 Encounter for general adult medical examination without abnormal findings: Secondary | ICD-10-CM | POA: Diagnosis not present

## 2021-01-09 DIAGNOSIS — K580 Irritable bowel syndrome with diarrhea: Secondary | ICD-10-CM | POA: Diagnosis not present

## 2021-01-15 ENCOUNTER — Telehealth: Payer: Medicare HMO | Admitting: Gastroenterology

## 2021-01-30 ENCOUNTER — Other Ambulatory Visit: Payer: Self-pay

## 2021-01-30 ENCOUNTER — Telehealth (INDEPENDENT_AMBULATORY_CARE_PROVIDER_SITE_OTHER): Payer: Medicare HMO | Admitting: Gastroenterology

## 2021-01-30 ENCOUNTER — Encounter: Payer: Self-pay | Admitting: Gastroenterology

## 2021-01-30 DIAGNOSIS — R197 Diarrhea, unspecified: Secondary | ICD-10-CM

## 2021-01-30 NOTE — Progress Notes (Signed)
Kayla Antigua, MD [address]  [place]  [place], [place] [address]  Main: [phone]  Fax: [phone]   Primary Care Physician: Birdie Sons, MD  Virtual Visit via Telephone Note  I connected with patient on 01/30/21 at  1:45 PM EDT by telephone and verified that I am speaking with the correct person using two identifiers.   I discussed the limitations, risks, security and privacy concerns of performing an evaluation and management service by telephone and the availability of in person appointments. I also discussed with the patient that there may be a patient responsible charge related to this service. The patient expressed understanding and agreed to proceed.  Location of Patient: Home Location of Provider: Home Persons involved: Patient and provider only during the visit (nursing staff and front desk staff was involved in communicating with the patient prior to the appointment, reviewing medications and checking them in)   History of Present Illness: Chief complaint: Diarrhea  HPI: Kayla Boyle is a 86 y.o. female here for follow-up of diarrhea and positive Cologuard.  Patient states diarrhea has completely resolved.  She states she switched her primary care provider recently and they took her off of metformin and since then she has had complete resolution of diarrhea and reports improvement in quality of life.  States was going to the bathroom all day before, and now only has 2 bowel movements a day that are formed.  No abdominal pain, nausea or vomiting.  No blood in stool.  Reports good appetite.  Current Outpatient Medications  Medication Sig Dispense Refill   Accu-Chek Softclix Lancets lancets Use to check blood sugar daily for type 2 diabetes. E11.9 100 each 4   albuterol (VENTOLIN HFA) 108 (90 Base) MCG/ACT inhaler Inhale 2 puffs into the lungs every 6 (six) hours as needed for wheezing or shortness of breath. 54 g 1   aspirin 81 MG tablet Take 1  tablet by mouth daily.     atenolol-chlorthalidone (TENORETIC) 50-25 MG tablet TAKE ONE TABLET BY MOUTH EACH DAY. 90 tablet 1   benzonatate (TESSALON) 100 MG capsule TAKE 1 CAPSULE BY MOUTH TWICE A DAY AS NEEDED FOR COUGH 20 capsule 3   Blood Glucose Monitoring Suppl (ACCU-CHEK GUIDE ME) w/Device KIT Use to check blood sugar daily for type 2 diabetes. E11.9 1 kit 0   carboxymethylcellulose (REFRESH PLUS) 0.5 % SOLN Place 1 drop into both eyes in the morning, at noon, in the evening, and at bedtime.     ciprofloxacin (CILOXAN) 0.3 % ophthalmic solution PLACE 1 DROP INTO BOTH EYES EVERY 4 HOURS WHILE AWAKE FOR 7 DAYS 5 mL 0   dicyclomine (BENTYL) 10 MG capsule Take 1 capsule (10 mg total) by mouth 4 (four) times daily -  before meals and at bedtime. For colitis 90 capsule 3   diphenoxylate-atropine (LOMOTIL) 2.5-0.025 MG tablet TAKE 1 TABLET BY MOUTH 4 TIMES DAILY AS NEEDED FOR DIARRHEA OR LOOSE STOOLS 180 tablet 1   esomeprazole (NEXIUM) 40 MG capsule TAKE 1 CAPSULE BY MOUTH DAILY 30 capsule 1   fluorometholone (FML) 0.1 % ophthalmic suspension Place 1 drop into the left eye 2 (two) times daily.     fluticasone (FLONASE) 50 MCG/ACT nasal spray Place 2 sprays into the nose daily.     furosemide (LASIX) 20 MG tablet TAKE 1 TABLET BY MOUTH EVERY DAY AS NEEDED (SWELLING) 90 tablet 1   gabapentin (NEURONTIN) 300 MG capsule Take 1 capsule (300 mg total) by mouth  3 (three) times daily. 90 capsule 3   glipiZIDE (GLUCOTROL XL) 2.5 MG 24 hr tablet Take 1 tablet (2.5 mg total) by mouth daily with breakfast. 30 tablet 5   glucose blood (ACCU-CHEK GUIDE) test strip Use to check blood sugar daily for type 2 diabetes. E11.9 100 each 12   levocetirizine (XYZAL) 5 MG tablet Take 1 by mouth every morning. 30 tablet 2   levothyroxine (SYNTHROID) 75 MCG tablet TAKE 1 TABLET BY MOUTH EVERY DAY 180 tablet 0   loratadine (CLARITIN REDITABS) 10 MG dissolvable tablet Take 1 tablet by mouth daily.     lovastatin (MEVACOR) 40  MG tablet TAKE 1 TABLET BY MOUTH AT BEDTIME 90 tablet 4   meloxicam (MOBIC) 7.5 MG tablet Take 1 tablet (7.5 mg total) by mouth daily as needed (shoulder pain). 30 tablet 0   methocarbamol (ROBAXIN) 500 MG tablet TAKE TWO TABLETS BY MOUTH EVERY 6 HOURS AS NEEDED FOR MUSCLE SPASMS 60 tablet 3   montelukast (SINGULAIR) 10 MG tablet TAKE 1 TABLET BY MOUTH DAILY 90 tablet 0   risedronate (ACTONEL) 35 MG tablet TAKE 1 TABLET BY MOUTH ONCE A WEEK WITH A FULL GLASS OF WATER AND DO NOT LIE BACK DOWN FOR 30 MINUTES AFTER 12 tablet 4   traMADol (ULTRAM) 50 MG tablet Take 1-2 tablets (50-100 mg total) by mouth every 8 (eight) hours as needed. 180 tablet 4   triamcinolone cream (KENALOG) 0.1 % Apply 2 times daily to spot treat affected areas as needed for itch. Avoid face, groin, underarms. 80 g 2   umeclidinium-vilanterol (ANORO ELLIPTA) 62.5-25 MCG/INH AEPB Inhale 1 puff into the lungs daily.     vitamin B-12 (CYANOCOBALAMIN) 100 MCG tablet Take 800 mcg by mouth daily.      Vitamin D, Cholecalciferol, 400 UNITS CHEW Chew 1,000 Units by mouth daily.     No current facility-administered medications for this visit.    Allergies as of 01/30/2021 - Review Complete 01/30/2021  Allergen Reaction Noted   Fosamax [alendronate sodium] Rash 11/11/2017   Bee venom Hives 01/02/2015   Cat hair extract Other (See Comments) 12/14/2019   Codeine Rash and Swelling 11/24/2014   Penicillins Rash and Swelling 11/24/2014    Review of Systems:    All systems reviewed and negative except where noted in HPI.   Observations/Objective:  Labs: CMP     Component Value Date/Time   NA 139 09/22/2020 1029   K 4.2 09/22/2020 1029   CL 98 09/22/2020 1029   CO2 26 09/22/2020 1029   GLUCOSE 96 09/22/2020 1029   BUN 23 09/22/2020 1029   CREATININE 1.22 (H) 09/22/2020 1029   CALCIUM 10.2 09/22/2020 1029   PROT 6.7 09/22/2020 1029   ALBUMIN 4.5 09/22/2020 1029   AST 17 09/22/2020 1029   ALT 10 09/22/2020 1029   ALKPHOS  67 09/22/2020 1029   BILITOT 0.4 09/22/2020 1029   GFRNONAA 41 (L) 10/27/2019 0940   GFRAA 48 (L) 10/27/2019 0940   Lab Results  Component Value Date   WBC 8.4 09/22/2020   HGB 12.4 09/22/2020   HCT 36.8 09/22/2020   MCV 90 09/22/2020   PLT 317 09/22/2020    Imaging Studies: No results found.  Assessment and Plan:   Kayla Boyle is a 86 y.o. y/o female here for follow-up of diarrhea  Assessment and Plan: Diarrhea is completely resolved with changes in her medications by her PCP, specifically patient attributes this to stopping her metformin  I again discussed need  for colonoscopy given her positive Cologuard.  Patient continues to decline colonoscopy and understands the risk of underlying malignancy if procedure is not done  She would like to continue to follow-up in our clinic and is requesting a follow-up appointment in case her diarrhea returns.  This is reasonable and follow-up can be made.  However, I have advised her to let us know if she changes her mind about doing a colonoscopy for positive Cologuard and she verbalized understanding  Follow Up Instructions:    I discussed the assessment and treatment plan with the patient. The patient was provided an opportunity to ask questions and all were answered. The patient agreed with the plan and demonstrated an understanding of the instructions.   The patient was advised to call back or seek an in-person evaluation if the symptoms worsen or if the condition fails to improve as anticipated.  I provided 10 minutes of non-face-to-face time during this encounter. Additional time was spent in reviewing patient's chart, placing orders etc.   Virgel Manifold, MD  Speech recognition software was used to dictate this note.

## 2021-02-02 ENCOUNTER — Other Ambulatory Visit: Payer: Self-pay | Admitting: Family Medicine

## 2021-02-07 DIAGNOSIS — E119 Type 2 diabetes mellitus without complications: Secondary | ICD-10-CM | POA: Diagnosis not present

## 2021-02-07 DIAGNOSIS — H353131 Nonexudative age-related macular degeneration, bilateral, early dry stage: Secondary | ICD-10-CM | POA: Diagnosis not present

## 2021-02-07 DIAGNOSIS — Z0100 Encounter for examination of eyes and vision without abnormal findings: Secondary | ICD-10-CM | POA: Diagnosis not present

## 2021-02-12 ENCOUNTER — Ambulatory Visit: Payer: Self-pay | Admitting: Family Medicine

## 2021-02-16 DIAGNOSIS — M19011 Primary osteoarthritis, right shoulder: Secondary | ICD-10-CM | POA: Diagnosis not present

## 2021-02-16 DIAGNOSIS — M24541 Contracture, right hand: Secondary | ICD-10-CM | POA: Diagnosis not present

## 2021-02-16 DIAGNOSIS — G8929 Other chronic pain: Secondary | ICD-10-CM | POA: Diagnosis not present

## 2021-02-16 DIAGNOSIS — M25511 Pain in right shoulder: Secondary | ICD-10-CM | POA: Diagnosis not present

## 2021-05-10 ENCOUNTER — Other Ambulatory Visit: Payer: Self-pay | Admitting: Family Medicine

## 2021-06-22 IMAGING — CT CT HEAD W/O CM
3 series · 15 of 44 positions shown, 18 images · non-contrast
Comparison: None.

CLINICAL DATA: Recent fall with forehead bruising, initial
encounter

EXAM:
CT HEAD WITHOUT CONTRAST
TECHNIQUE: Contiguous axial images were obtained from the base of the skull
through the vertex without intravenous contrast.

[Series 2: head wo · axial · 0.39mm/px · z∈[-141,-31]mm · 9 of 27 slices shown, 12 images]
[im 3/27  brain]
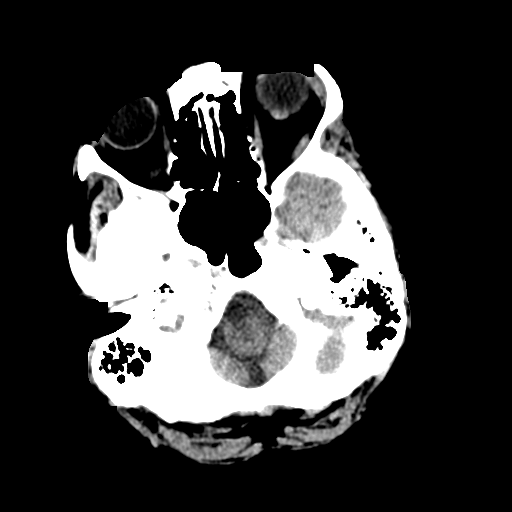
[im 3/27  bone]
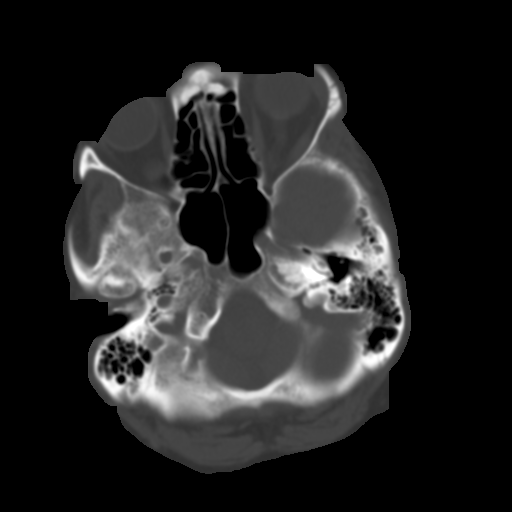
[im 6/27  brain]
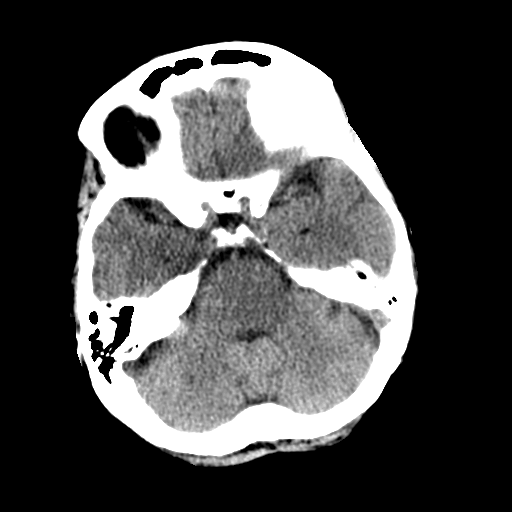
[im 8/27  brain]
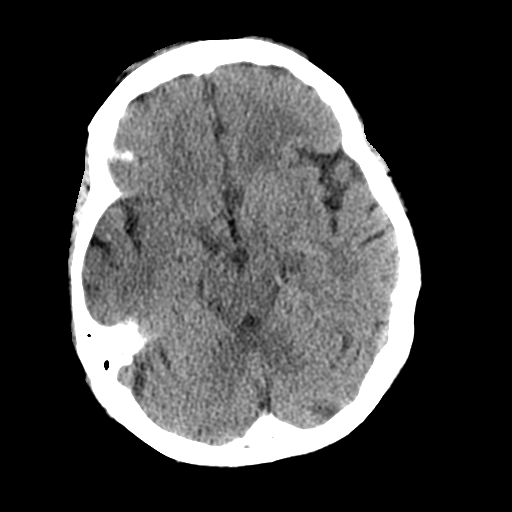
[im 11/27  brain]
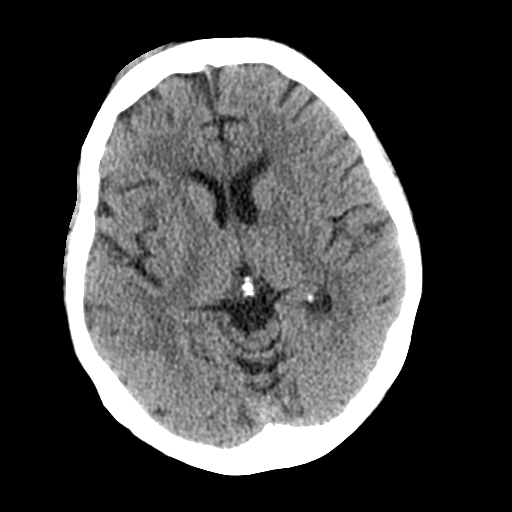
[im 14/27  brain]
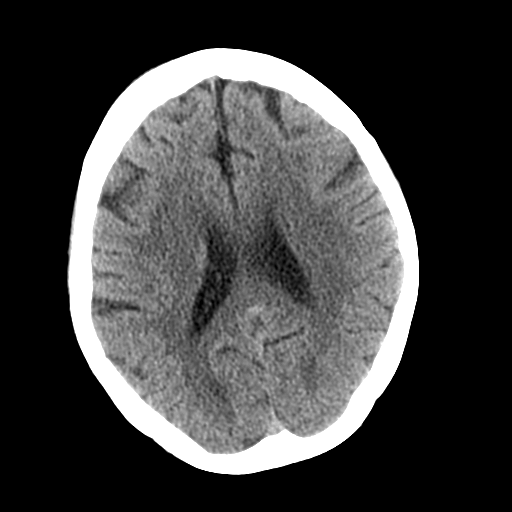
[im 14/27  bone]
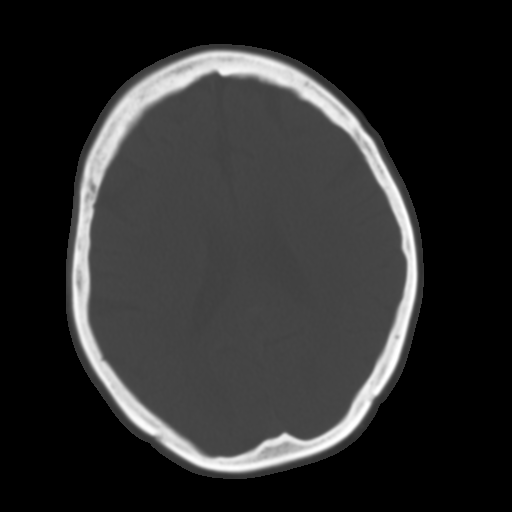
[im 17/27  brain]
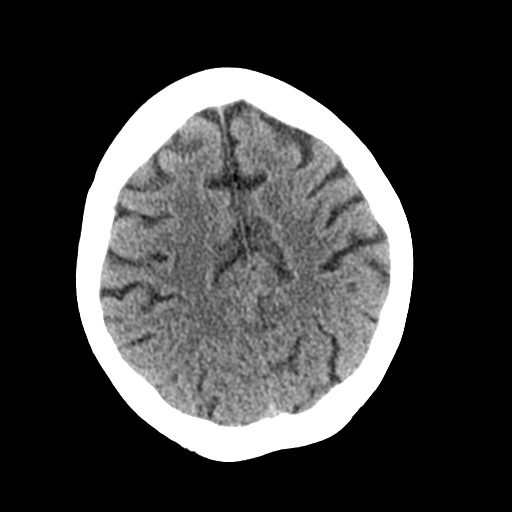
[im 20/27  brain]
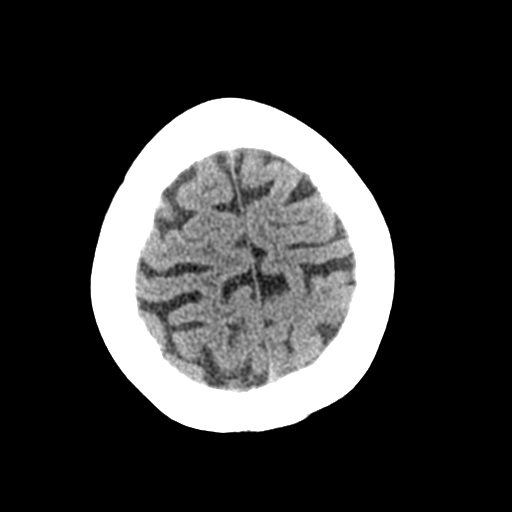
[im 22/27  brain]
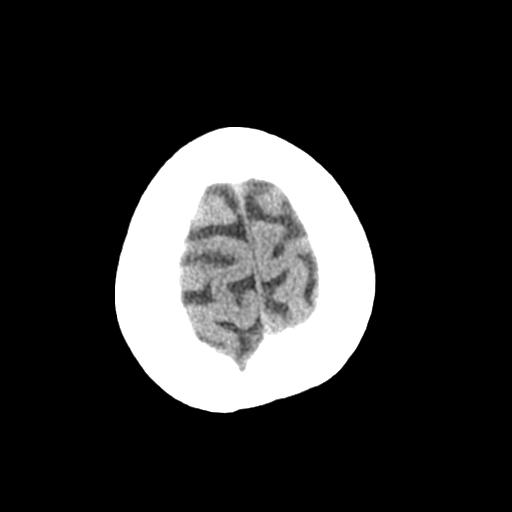
[im 25/27  brain]
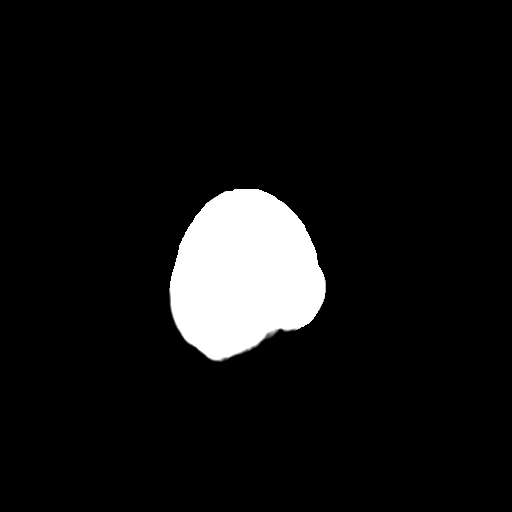
[im 25/27  bone]
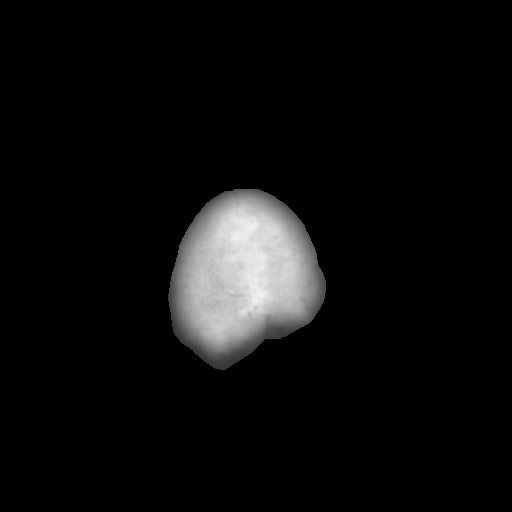

[Series 4: coronal soft tissue · coronal · 0.27mm/px · 3 of 62 slices shown]
[im 21/62  brain]
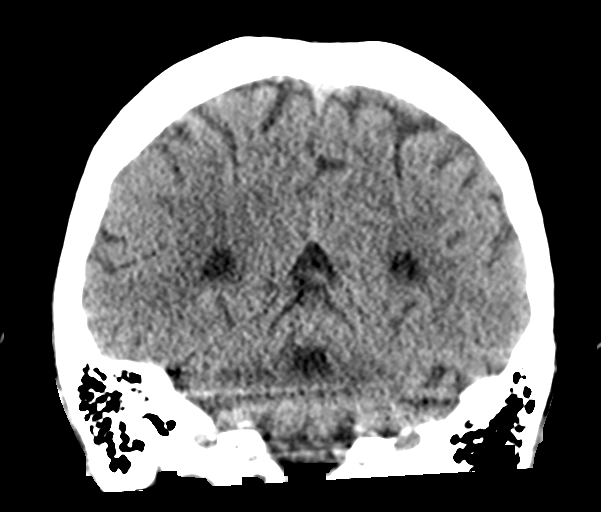
[im 28/62  brain]
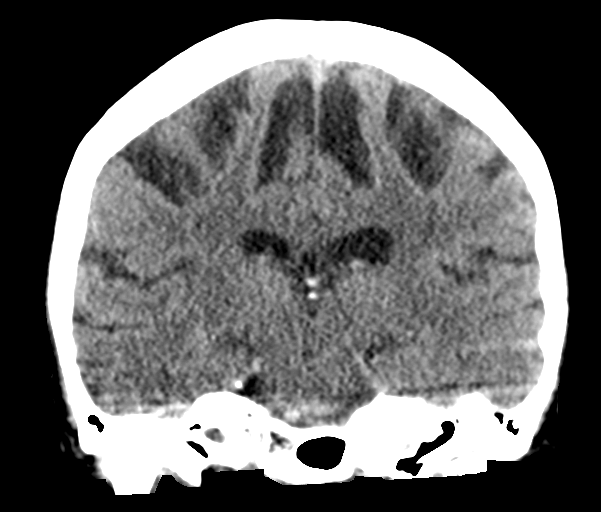
[im 34/62  brain]
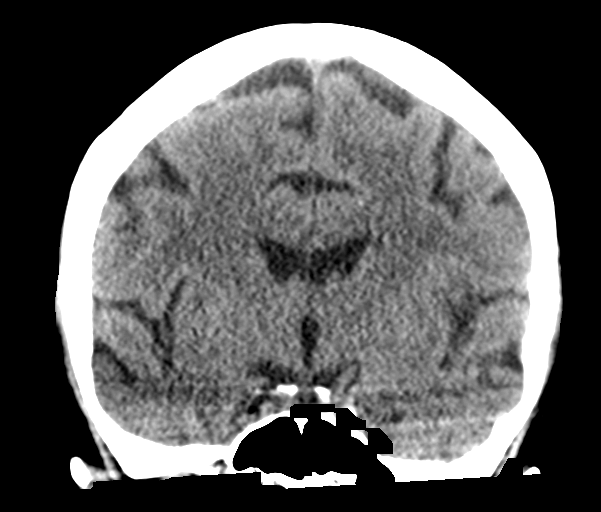

[Series 5: sagittal soft tissue · sagittal · 0.27mm/px · 3 of 55 slices shown]
[im 19/55  brain]
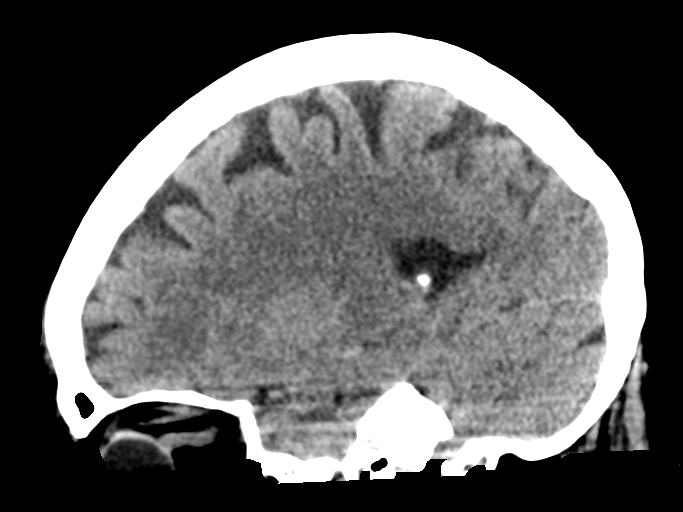
[im 28/55  brain]
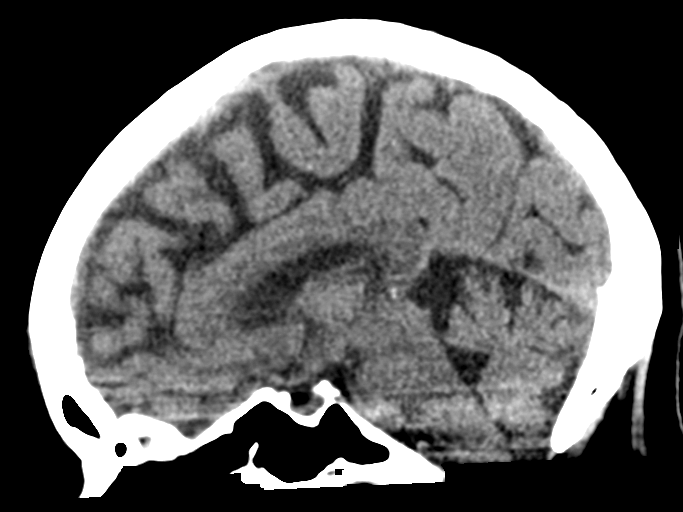
[im 37/55  brain]
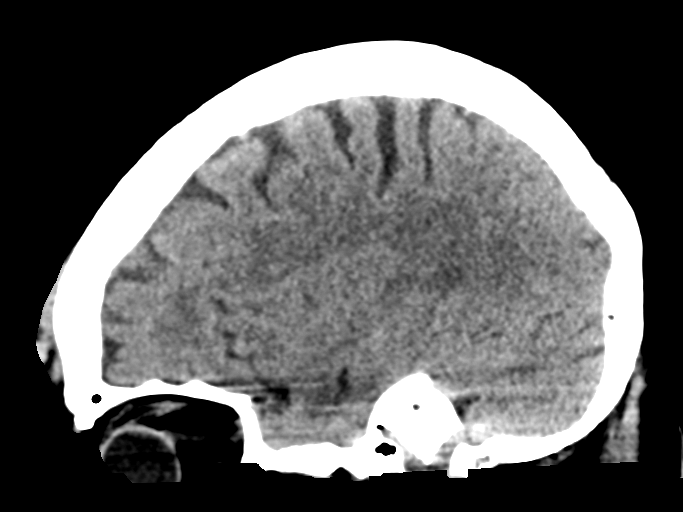

[15 of 44 positions shown; findings below may reference images not displayed]

FINDINGS: Brain: No evidence of acute infarction, hemorrhage, hydrocephalus,
extra-axial collection or mass lesion/mass effect.

Vascular: No hyperdense vessel or unexpected calcification.

Skull: Normal. Negative for fracture or focal lesion.

Sinuses/Orbits: No acute finding.

Other: Forehead scalp swelling is noted consistent with the recent
injury.
IMPRESSION: Mild forehead scalp swelling on the right. No acute intracranial
abnormality noted.

## 2022-05-07 ENCOUNTER — Encounter: Payer: Self-pay | Admitting: Ophthalmology

## 2022-05-08 ENCOUNTER — Encounter: Payer: Self-pay | Admitting: Ophthalmology

## 2022-05-09 NOTE — Discharge Instructions (Signed)

## 2022-05-13 ENCOUNTER — Encounter
Admission: RE | Disposition: A | Discharge status: Home / Self Care | Payer: Self-pay | Source: Home / Self Care | Attending: Ophthalmology

## 2022-05-13 ENCOUNTER — Ambulatory Visit: Payer: Medicare Other | Admitting: Anesthesiology

## 2022-05-13 ENCOUNTER — Other Ambulatory Visit: Payer: Self-pay

## 2022-05-13 ENCOUNTER — Ambulatory Visit
Admission: RE | Admit: 2022-05-13 | Discharge: 2022-05-13 | Disposition: A | Discharge status: Home / Self Care | Payer: Medicare Other | Attending: Ophthalmology | Admitting: Ophthalmology

## 2022-05-13 DIAGNOSIS — Z87891 Personal history of nicotine dependence: Secondary | ICD-10-CM | POA: Insufficient documentation

## 2022-05-13 DIAGNOSIS — J449 Chronic obstructive pulmonary disease, unspecified: Secondary | ICD-10-CM | POA: Diagnosis not present

## 2022-05-13 DIAGNOSIS — E039 Hypothyroidism, unspecified: Secondary | ICD-10-CM | POA: Insufficient documentation

## 2022-05-13 DIAGNOSIS — M1990 Unspecified osteoarthritis, unspecified site: Secondary | ICD-10-CM | POA: Insufficient documentation

## 2022-05-13 DIAGNOSIS — I10 Essential (primary) hypertension: Secondary | ICD-10-CM | POA: Insufficient documentation

## 2022-05-13 DIAGNOSIS — H2512 Age-related nuclear cataract, left eye: Secondary | ICD-10-CM | POA: Diagnosis not present

## 2022-05-13 DIAGNOSIS — E1136 Type 2 diabetes mellitus with diabetic cataract: Secondary | ICD-10-CM | POA: Insufficient documentation

## 2022-05-13 DIAGNOSIS — Z89511 Acquired absence of right leg below knee: Secondary | ICD-10-CM | POA: Insufficient documentation

## 2022-05-13 DIAGNOSIS — E785 Hyperlipidemia, unspecified: Secondary | ICD-10-CM | POA: Insufficient documentation

## 2022-05-13 HISTORY — DX: Chronic obstructive pulmonary disease, unspecified: J44.9

## 2022-05-13 HISTORY — DX: Cardiac murmur, unspecified: R01.1

## 2022-05-13 HISTORY — DX: Presence of artificial limb (complete) (partial), unspecified: Z97.10

## 2022-05-13 HISTORY — DX: Presence of dental prosthetic device (complete) (partial): Z97.2

## 2022-05-13 HISTORY — DX: Dizziness and giddiness: R42

## 2022-05-13 HISTORY — PX: CATARACT EXTRACTION W/PHACO: SHX586

## 2022-05-13 LAB — GLUCOSE, CAPILLARY
Glucose-Capillary: 135 mg/dL — ABNORMAL HIGH (ref 70–99)
Glucose-Capillary: 124 mg/dL — ABNORMAL HIGH (ref 70–99)

## 2022-05-13 SURGERY — PHACOEMULSIFICATION, CATARACT, WITH IOL INSERTION
Anesthesia: Monitor Anesthesia Care | Site: Eye | Laterality: Left

## 2022-05-13 MED ORDER — SIGHTPATH DOSE#1 NA HYALUR & NA CHOND-NA HYALUR IO KIT
PACK | INTRAOCULAR | Status: DC | PRN
  Administered 2022-05-13: 1 via OPHTHALMIC

## 2022-05-13 MED ORDER — TETRACAINE HCL 0.5 % OP SOLN
1.0000 [drp] | OPHTHALMIC | Status: DC | PRN
  Administered 2022-05-13 (×3): 1 [drp] via OPHTHALMIC

## 2022-05-13 MED ORDER — MOXIFLOXACIN HCL 0.5 % OP SOLN
OPHTHALMIC | Status: DC | PRN
  Administered 2022-05-13: .2 mL via OPHTHALMIC

## 2022-05-13 MED ORDER — LACTATED RINGERS IV SOLN: INTRAVENOUS | Status: DC

## 2022-05-13 MED ORDER — ARMC OPHTHALMIC DILATING DROPS
1.0000 | OPHTHALMIC | Status: DC | PRN
  Administered 2022-05-13 (×3): 1 via OPHTHALMIC

## 2022-05-13 MED ORDER — SIGHTPATH DOSE#1 BSS IO SOLN
INTRAOCULAR | Status: DC | PRN
  Administered 2022-05-13: 15 mL

## 2022-05-13 MED ORDER — MIDAZOLAM HCL 2 MG/2ML IJ SOLN
INTRAMUSCULAR | Status: DC | PRN
  Administered 2022-05-13 (×2): .5 mg via INTRAVENOUS

## 2022-05-13 MED ORDER — SIGHTPATH DOSE#1 BSS IO SOLN
INTRAOCULAR | Status: DC | PRN
  Administered 2022-05-13: 96 mL via OPHTHALMIC

## 2022-05-13 MED ORDER — FENTANYL CITRATE (PF) 100 MCG/2ML IJ SOLN
INTRAMUSCULAR | Status: DC | PRN
  Administered 2022-05-13 (×2): 25 ug via INTRAVENOUS

## 2022-05-13 MED ORDER — LIDOCAINE HCL (PF) 2 % IJ SOLN
INTRAOCULAR | Status: DC | PRN
  Administered 2022-05-13: 1 mL via INTRAOCULAR

## 2022-05-13 SURGICAL SUPPLY — 13 items
CATARACT SUITE SIGHTPATH (MISCELLANEOUS) ×1 IMPLANT
DISSECTOR HYDRO NUCLEUS 50X22 (MISCELLANEOUS) ×1 IMPLANT
FEE CATARACT SUITE SIGHTPATH (MISCELLANEOUS) ×1 IMPLANT
GLOVE SURG GAMMEX PI TX LF 7.5 (GLOVE) ×1 IMPLANT
GLOVE SURG SYN 8.5  E (GLOVE) ×1
GLOVE SURG SYN 8.5 E (GLOVE) ×1 IMPLANT
GLOVE SURG SYN 8.5 PF PI (GLOVE) ×1 IMPLANT
LENS IOL TECNIS EYHANCE 23.0 (Intraocular Lens) IMPLANT
NDL FILTER BLUNT 18X1 1/2 (NEEDLE) ×1 IMPLANT
NEEDLE FILTER BLUNT 18X1 1/2 (NEEDLE) ×1 IMPLANT
SYR 3ML LL SCALE MARK (SYRINGE) ×1 IMPLANT
SYR 5ML LL (SYRINGE) ×1 IMPLANT
WATER STERILE IRR 250ML POUR (IV SOLUTION) ×1 IMPLANT

## 2022-05-13 NOTE — Transfer of Care (Signed)
Immediate Anesthesia Transfer of Care Note  Patient: Kayla Boyle  Procedure(s) Performed: CATARACT EXTRACTION PHACO AND INTRAOCULAR LENS PLACEMENT (IOC) LEFT DIABETIC 5.10  00:38.8 (Left: Eye)  Patient Location: PACU  Anesthesia Type: MAC  Level of Consciousness: awake, alert  and patient cooperative  Airway and Oxygen Therapy: Patient Spontanous Breathing and Patient connected to supplemental oxygen  Post-op Assessment: Post-op Vital signs reviewed, Patient's Cardiovascular Status Stable, Respiratory Function Stable, Patent Airway and No signs of Nausea or vomiting  Post-op Vital Signs: Reviewed and stable  Complications: No notable events documented.

## 2022-05-13 NOTE — Anesthesia Postprocedure Evaluation (Signed)
Anesthesia Post Note  Patient: Kayla Boyle  Procedure(s) Performed: CATARACT EXTRACTION PHACO AND INTRAOCULAR LENS PLACEMENT (IOC) LEFT DIABETIC 5.10  00:38.8 (Left: Eye)  Anesthesia Type: MAC Anesthetic complications: no   No notable events documented.   Last Vitals:  Vitals:   05/13/22 0642  BP: (!) 161/63  Pulse: 65  Resp: 16  Temp: 36.6 C  SpO2: 98%    Last Pain:  Vitals:   05/13/22 0642  TempSrc: Temporal  PainSc: 6                  Jacqualin Combes

## 2022-05-13 NOTE — Op Note (Signed)
OPERATIVE NOTE  Kayla Boyle [id number] 05/13/2022   PREOPERATIVE DIAGNOSIS:  Nuclear sclerotic cataract left eye.  H25.12   POSTOPERATIVE DIAGNOSIS:    Nuclear sclerotic cataract left eye.     PROCEDURE:  Phacoemusification with posterior chamber intraocular lens placement of the left eye   LENS:   Implant Name Type Inv. Item Serial No. Manufacturer Lot No. LRB No. Used Action  LENS IOL TECNIS EYHANCE 23.0 - [id number] Intraocular Lens LENS IOL TECNIS EYHANCE 23.0 [id number] [employer]  Left 1 Implanted      Procedure(s) with comments: CATARACT EXTRACTION PHACO AND INTRAOCULAR LENS PLACEMENT (IOC) LEFT DIABETIC 5.10  00:38.8 (Left) - Late[employer] Diabetic  DIB00 +23.0   SURGEON:  Benay Pillow, MD, MPH   ANESTHESIA:  Topical with tetracaine drops augmented with 1% preservative-free intracameral lidocaine.  ESTIMATED BLOOD LOSS: <1 mL   COMPLICATIONS:  None.   DESCRIPTION OF PROCEDURE:  The patient was identified in the holding room and transported to the operating room and placed in the supine position under the operating microscope.  The left eye was identified as the operative eye and it was prepped and draped in the usual sterile ophthalmic fashion.   A 1.0 millimeter clear-corneal paracentesis was made at the 5:00 position. 0.5 ml of preservative-free 1% lidocaine with epinephrine was injected into the anterior chamber.  The anterior chamber was filled with viscoelastic.  A 2.4 millimeter keratome was used to make a near-clear corneal incision at the 2:00 position.  A curvilinear capsulorrhexis was made with a cystotome and capsulorrhexis forceps.  Balanced salt solution was used to hydrodissect and hydrodelineate the nucleus.   Phacoemulsification was then used in stop and chop fashion to remove the lens nucleus and epinucleus.  The remaining cortex was then removed using the irrigation and aspiration handpiece. Viscoelastic was then placed into the capsular bag to distend it for  lens placement.  A lens was then injected into the capsular bag.  The remaining viscoelastic was aspirated.   Wounds were hydrated with balanced salt solution.  The anterior chamber was inflated to a physiologic pressure with balanced salt solution.  Intracameral vigamox 0.1 mL undiltued was injected into the eye and a drop placed onto the ocular surface.  No wound leaks were noted.  The patient was taken to the recovery room in stable condition without complications of anesthesia or surgery  Benay Pillow 05/13/2022, 7:53 AM

## 2022-05-13 NOTE — Anesthesia Preprocedure Evaluation (Addendum)
Anesthesia Evaluation  Patient identified by MRN, date of birth, ID band Patient awake    Reviewed: Allergy & Precautions, NPO status , Patient's Chart, lab work & pertinent test results  History of Anesthesia Complications Negative for: history of anesthetic complications  Airway Mallampati: III  TM Distance: >3 FB Neck ROM: full    Dental  (+) Chipped   Pulmonary neg shortness of breath, COPD,  COPD inhaler, neg recent URI, former smoker   Pulmonary exam normal        Cardiovascular hypertension, (-) angina (-) Past MI and (-) CABG negative cardio ROS Normal cardiovascular exam     Neuro/Psych negative neurological ROS  negative psych ROS   GI/Hepatic negative GI ROS, Neg liver ROS,,,  Endo/Other  diabetesHypothyroidism    Renal/GU      Musculoskeletal   Abdominal   Peds  Hematology negative hematology ROS (+)   Anesthesia Other Findings Past Medical History: No date: Allergy No date: Arthritis     Comment:  osteo No date: Cataract No date: COPD (chronic obstructive pulmonary disease) (HCC) No date: Diabetes mellitus without complication (HCC)     Comment:  type 2 No date: Employs prosthetic leg     Comment:  Right 11/24/2014: H/O acute poliomyelitis No date: Heart murmur No date: History of chicken pox No date: Hyperlipidemia No date: Hypertension No date: Hypothyroidism No date: Shingles No date: Vertigo     Comment:  1 episode. approc 2017 No date: Wears dentures     Comment:  full upper and lower  Past Surgical History: 1963: ABDOMINAL HYSTERECTOMY No date: APPENDECTOMY No date: CARPAL TUNNEL RELEASE 10/2003: CHOLECYSTECTOMY     Comment:  laproscopic No date: ELBOW SURGERY; Bilateral 01/2010: HAND SURGERY 1975: LEG AMPUTATION BELOW KNEE; Right     Comment:  traumatic; bleow knee and club foot  BMI    Body Mass Index: 27.27 kg/m      Reproductive/Obstetrics negative OB ROS                               Anesthesia Physical Anesthesia Plan  ASA: 3  Anesthesia Plan: MAC   Post-op Pain Management:    Induction: Intravenous  PONV Risk Score and Plan: 2  Airway Management Planned: Natural Airway and Nasal Cannula  Additional Equipment:   Intra-op Plan:   Post-operative Plan:   Informed Consent: I have reviewed the patients History and Physical, chart, labs and discussed the procedure including the risks, benefits and alternatives for the proposed anesthesia with the patient or authorized representative who has indicated his/her understanding and acceptance.     Dental Advisory Given  Plan Discussed with: Anesthesiologist, CRNA and Surgeon  Anesthesia Plan Comments: (Patient consented for risks of anesthesia including but not limited to:  - adverse reactions to medications - damage to eyes, teeth, lips or other oral mucosa - nerve damage due to positioning  - sore throat or hoarseness - Damage to heart, brain, nerves, lungs, other parts of body or loss of life  Patient voiced understanding.)        Anesthesia Quick Evaluation  

## 2022-05-13 NOTE — H&P (Signed)
[facility]   Primary Care Physician:  Birdie Sons, MD Ophthalmologist: Dr. Benay Pillow  Pre-Procedure History & Physical: HPI:  Kayla Boyle is a 87 y.o. female here for cataract surgery.   Past Medical History:  Diagnosis Date   Allergy    Arthritis    osteo   Cataract    COPD (chronic obstructive pulmonary disease) (HCC)    Diabetes mellitus without complication ([place])    type 2   Employs prosthetic leg    Right   H/O acute poliomyelitis 11/24/2014   Heart murmur    History of chicken pox    Hyperlipidemia    Hypertension    Hypothyroidism    Shingles    Vertigo    1 episode. approc 2017   Wears dentures    full upper and lower    Past Surgical History:  Procedure Laterality Date   ABDOMINAL HYSTERECTOMY  1963   APPENDECTOMY     CARPAL TUNNEL RELEASE     CHOLECYSTECTOMY  10/2003   laproscopic   ELBOW SURGERY Bilateral    HAND SURGERY  01/2010   LEG AMPUTATION BELOW KNEE Right 1975   traumatic; bleow knee and club foot    Prior to Admission medications   Medication Sig Start Date End Date Taking? Authorizing Provider  albuterol (VENTOLIN HFA) 108 (90 Base) MCG/ACT inhaler Inhale 2 puffs into the lungs every 6 (six) hours as needed for wheezing or shortness of breath. 03/26/18  Yes Birdie Sons, MD  alendronate (FOSAMAX) 70 MG tablet Take 70 mg by mouth once a week. Take with a full glass of water on an empty stomach.   Yes [provider]  aspirin 81 MG tablet Take 1 tablet by mouth daily. 07/19/10  Yes [provider]  atenolol-chlorthalidone (TENORETIC) 50-25 MG tablet TAKE 1 TABLET BY MOUTH DAILY 05/10/21  Yes Birdie Sons, MD  benzonatate (TESSALON) 100 MG capsule TAKE 1 CAPSULE BY MOUTH TWICE A DAY AS NEEDED FOR COUGH 09/25/20  Yes Birdie Sons, MD  carboxymethylcellulose (REFRESH PLUS) 0.5 % SOLN Place 1 drop into both eyes in the morning, at noon, in the evening, and at bedtime.   Yes [provider]   colestipol (COLESTID) 1 g tablet Take 1 g by mouth 2 (two) times daily.   Yes [provider]  dicyclomine (BENTYL) 10 MG capsule Take 1 capsule (10 mg total) by mouth 4 (four) times daily -  before meals and at bedtime. For colitis Patient taking differently: Take 10 mg by mouth 4 (four) times daily as needed. For colitis 11/21/20  Yes Birdie Sons, MD  diphenoxylate-atropine (LOMOTIL) 2.5-0.025 MG tablet TAKE 1 TABLET BY MOUTH 4 TIMES DAILY AS NEEDED FOR DIARRHEA OR LOOSE STOOLS 11/21/20  Yes Birdie Sons, MD  fluorometholone (FML) 0.1 % ophthalmic suspension Place 1 drop into the left eye 2 (two) times daily. 08/10/20  Yes [provider]  furosemide (LASIX) 20 MG tablet TAKE 1 TABLET BY MOUTH EVERY DAY AS NEEDED (SWELLING) 09/25/20  Yes Birdie Sons, MD  gabapentin (NEURONTIN) 300 MG capsule Take 1 capsule (300 mg total) by mouth 3 (three) times daily. Patient taking differently: Take 300 mg by mouth 3 (three) times daily as needed. 09/25/20  Yes Birdie Sons, MD  levocetirizine (XYZAL) 5 MG tablet Take 1 by mouth every morning. Patient taking differently: daily as needed. Take 1 by mouth every morning. 01/17/20  Yes Brendolyn Patty, MD  levothyroxine (  SYNTHROID) 75 MCG tablet TAKE 1 TABLET BY MOUTH EVERY DAY 07/31/20  Yes Birdie Sons, MD  lovastatin (MEVACOR) 40 MG tablet TAKE 1 TABLET BY MOUTH AT BEDTIME 02/09/20  Yes Birdie Sons, MD  meloxicam (MOBIC) 7.5 MG tablet Take 1 tablet (7.5 mg total) by mouth daily as needed (shoulder pain). 03/14/20  Yes Birdie Sons, MD  methocarbamol (ROBAXIN) 500 MG tablet TAKE TWO TABLETS BY MOUTH EVERY 6 HOURS AS NEEDED FOR MUSCLE SPASMS 09/25/20  Yes Birdie Sons, MD  montelukast (SINGULAIR) 10 MG tablet TAKE 1 TABLET BY MOUTH DAILY 12/09/20  Yes Birdie Sons, MD  omeprazole (PRILOSEC) 20 MG capsule Take 20 mg by mouth daily.   Yes [provider]  sitaGLIPtin (JANUVIA) 50 MG tablet Take 50 mg by mouth daily.    Yes [provider]  traMADol (ULTRAM) 50 MG tablet Take 1-2 tablets (50-100 mg total) by mouth every 8 (eight) hours as needed. 09/25/20  Yes Birdie Sons, MD  triamcinolone cream (KENALOG) 0.1 % Apply 2 times daily to spot treat affected areas as needed for itch. Avoid face, groin, underarms. 01/17/20  Yes Brendolyn Patty, MD  umeclidinium-vilanterol [facility] ELLIPTA) 62.5-25 MCG/INH AEPB Inhale 1 puff into the lungs daily. 06/20/20  Yes [provider]  vitamin B-12 (CYANOCOBALAMIN) 100 MCG tablet Take 800 mcg by mouth daily.    Yes [provider]  Vitamin D, Cholecalciferol, 400 UNITS CHEW Chew 1,000 Units by mouth daily.   Yes [provider]  Accu-Chek Softclix Lancets lancets Use to check blood sugar daily for type 2 diabetes. E11.9 09/25/20   Birdie Sons, MD  Blood Glucose Monitoring Suppl (ACCU-CHEK GUIDE ME) w/Device KIT Use to check blood sugar daily for type 2 diabetes. E11.9 09/28/20   Birdie Sons, MD  glipiZIDE (GLUCOTROL XL) 2.5 MG 24 hr tablet Take 1 tablet (2.5 mg total) by mouth daily with breakfast. Patient not taking: Reported on 05/07/2022 09/22/20   Birdie Sons, MD  glucose blood (ACCU-CHEK GUIDE) test strip Use to check blood sugar daily for type 2 diabetes. E11.9 09/28/20   Birdie Sons, MD  risedronate (ACTONEL) 35 MG tablet TAKE 1 TABLET BY MOUTH ONCE A WEEK WITH A FULL GLASS OF WATER AND DO NOT LIE BACK DOWN FOR 30 MINUTES AFTER Patient not taking: Reported on 05/07/2022 11/04/20   Birdie Sons, MD    Allergies as of 02/11/2022 - Review Complete 01/30/2021  Allergen Reaction Noted   Fosamax [alendronate sodium] Rash 11/11/2017   Bee venom Hives 01/02/2015   Cat hair extract Other (See Comments) 12/14/2019   Codeine Rash and Swelling 11/24/2014   Penicillins Rash and Swelling 11/24/2014    Family History  Problem Relation Age of Onset   Pancreatic cancer Father    Prostate cancer Brother    Diabetes Brother         type 2   Hepatitis C Daughter    HIV/AIDS Son    Cancer Son        LYMPH NODE CANCER   Cancer Other 20       small cell cancer   Glaucoma Sister    Cancer Sister        kidney removed    Social History   Socioeconomic History   Marital status: Widowed    Spouse name: Not on file   Number of children: 2   Years of education: 12   Highest education level: High school graduate  Occupational History   Occupation: Disabled   Occupation: retired  Tobacco Use   Smoking status: Former    Types: Cigarettes    Quit date: 10/06/2001    Years since quitting: 20.6   Smokeless tobacco: Never  Vaping Use   Vaping Use: Never used  Substance and Sexual Activity   Alcohol use: No    Alcohol/week: 0.0 standard drinks of alcohol   Drug use: No   Sexual activity: Not on file  Other Topics Concern   Not on file  Social History Narrative   Not on file   Social Determinants of Health   Financial Resource Strain: Medium Risk (08/31/2020)   Overall Financial Resource Strain (CARDIA)    Difficulty of Paying Living Expenses: Somewhat hard  Food Insecurity: No Food Insecurity (08/31/2020)   Hunger Vital Sign    Worried About Running Out of Food in the Last Year: Never true    [place] in the Last Year: Never true  Transportation Needs: No Transportation Needs (08/31/2020)   PRAPARE - Hydrologist (Medical): No    Lack of Transportation (Non-Medical): No  Physical Activity: Inactive (08/31/2020)   Exercise Vital Sign    Days of Exercise per Week: 0 days    Minutes of Exercise per Session: 0 min  Stress: No Stress Concern Present (08/31/2020)   [place]    Feeling of Stress : Not at all  Social Connections: Moderately Isolated (08/31/2020)   Social Connection and Isolation Panel [NHANES]    Frequency of Communication with Friends and Family: More than three times a week    Frequency of  Social Gatherings with Friends and Family: More than three times a week    Attends Religious Services: More than 4 times per year    Active Member of [employer] or Organizations: No    Attends Archivist Meetings: Never    Marital Status: Widowed  Intimate Partner Violence: Not At Risk (08/31/2020)   Humiliation, Afraid, Rape, and Kick questionnaire    Fear of Current or Ex-Partner: No    Emotionally Abused: No    Physically Abused: No    Sexually Abused: No    Review of Systems: See HPI, otherwise negative ROS  Physical Exam: BP (!) 161/63   Pulse 65   Temp 97.9 F (36.6 C) (Temporal)   Resp 16   Ht _0  (1.499 m)   Wt 61.2 kg   SpO2 98%   BMI 27.27 kg/m  General:   Alert, cooperative in NAD Head:  Normocephalic and atraumatic. Respiratory:  Normal work of breathing. Cardiovascular:  RRR  Impression/Plan: Kayla Boyle is here for cataract surgery.  Risks, benefits, limitations, and alternatives regarding cataract surgery have been reviewed with the patient.  Questions have been answered.  All parties agreeable.   Benay Pillow, MD  05/13/2022, 7:07 AM

## 2022-05-14 ENCOUNTER — Encounter: Payer: Self-pay | Admitting: Ophthalmology

## 2022-05-15 ENCOUNTER — Other Ambulatory Visit: Payer: Self-pay

## 2022-05-15 ENCOUNTER — Encounter: Payer: Self-pay | Admitting: Ophthalmology

## 2022-05-17 ENCOUNTER — Other Ambulatory Visit: Payer: Self-pay | Admitting: Family Medicine

## 2022-05-17 NOTE — Telephone Encounter (Signed)
Patient is no longer a patient at [facility].  Requested Prescriptions  Refused Prescriptions Disp Refills   atenolol-chlorthalidone (TENORETIC) 50-25 MG tablet [Pharmacy Med Name: ATENOLOL-CHLORTHALIDONE 50-25 MG TA] 90 tablet 3    Sig: TAKE 1 TABLET BY MOUTH DAILY     Cardiovascular: Beta Blocker + Diuretic Combos Failed - 05/17/2022 10:22 AM      Failed - K in normal range and within 180 days    Potassium  Date Value Ref Range Status  09/22/2020 4.2 3.5 - 5.2 mmol/L Final         Failed - Na in normal range and within 180 days    Sodium  Date Value Ref Range Status  09/22/2020 139 134 - 144 mmol/L Final         Failed - Cr in normal range and within 180 days    Creatinine, Ser  Date Value Ref Range Status  09/22/2020 1.22 (H) 0.57 - 1.00 mg/dL Final         Failed - eGFR in normal range and within 180 days    GFR calc Af Amer  Date Value Ref Range Status  10/27/2019 48 (L) >59 mL/min/1.73 Final   GFR calc non Af Amer  Date Value Ref Range Status  10/27/2019 41 (L) >59 mL/min/1.73 Final   eGFR  Date Value Ref Range Status  09/22/2020 44 (L) >59 mL/min/1.73 Final         Failed - Valid encounter within last 6 months    Recent Outpatient Visits           1 year ago Type 2 diabetes mellitus with other diabetic kidney complication, without long-term current use of insulin ([place])   [facility] Birdie Sons, MD   2 years ago Contusion of face, initial encounter   [facility] Birdie Sons, MD   2 years ago Type 2 diabetes mellitus with other diabetic kidney complication, without long-term current use of insulin [facility])   [facility] Birdie Sons, MD   2 years ago [place], Donald E, MD   2 years ago Diarrhea, unspecified type   [facility] Birdie Sons, MD              Passed - Last BP in normal range    BP Readings from Last 1 Encounters:  05/13/22 (!)  126/55         Passed - Last Heart Rate in normal range    Pulse Readings from Last 1 Encounters:  05/13/22 (!) 58

## 2022-05-17 NOTE — Telephone Encounter (Signed)
Called patient to verify who her PCP is now. Patient reports she is now being seen by a different provider and no longer at [facility].

## 2022-05-23 NOTE — Discharge Instructions (Signed)

## 2022-05-27 ENCOUNTER — Ambulatory Visit: Payer: Medicare Other | Admitting: Anesthesiology

## 2022-05-27 ENCOUNTER — Other Ambulatory Visit: Payer: Self-pay

## 2022-05-27 ENCOUNTER — Encounter
Admission: RE | Disposition: A | Discharge status: Home / Self Care | Payer: Self-pay | Source: Home / Self Care | Attending: Ophthalmology

## 2022-05-27 ENCOUNTER — Ambulatory Visit
Admission: RE | Admit: 2022-05-27 | Discharge: 2022-05-27 | Disposition: A | Discharge status: Home / Self Care | Payer: Medicare Other | Attending: Ophthalmology | Admitting: Ophthalmology

## 2022-05-27 DIAGNOSIS — E785 Hyperlipidemia, unspecified: Secondary | ICD-10-CM | POA: Insufficient documentation

## 2022-05-27 DIAGNOSIS — J449 Chronic obstructive pulmonary disease, unspecified: Secondary | ICD-10-CM | POA: Diagnosis not present

## 2022-05-27 DIAGNOSIS — E1136 Type 2 diabetes mellitus with diabetic cataract: Secondary | ICD-10-CM | POA: Insufficient documentation

## 2022-05-27 DIAGNOSIS — Z79899 Other long term (current) drug therapy: Secondary | ICD-10-CM | POA: Diagnosis not present

## 2022-05-27 DIAGNOSIS — Z7984 Long term (current) use of oral hypoglycemic drugs: Secondary | ICD-10-CM | POA: Diagnosis not present

## 2022-05-27 DIAGNOSIS — E039 Hypothyroidism, unspecified: Secondary | ICD-10-CM | POA: Diagnosis not present

## 2022-05-27 DIAGNOSIS — Z87891 Personal history of nicotine dependence: Secondary | ICD-10-CM | POA: Insufficient documentation

## 2022-05-27 DIAGNOSIS — I10 Essential (primary) hypertension: Secondary | ICD-10-CM | POA: Diagnosis not present

## 2022-05-27 DIAGNOSIS — H2511 Age-related nuclear cataract, right eye: Secondary | ICD-10-CM | POA: Insufficient documentation

## 2022-05-27 DIAGNOSIS — E1129 Type 2 diabetes mellitus with other diabetic kidney complication: Secondary | ICD-10-CM

## 2022-05-27 DIAGNOSIS — Z79890 Hormone replacement therapy (postmenopausal): Secondary | ICD-10-CM | POA: Insufficient documentation

## 2022-05-27 HISTORY — PX: CATARACT EXTRACTION W/PHACO: SHX586

## 2022-05-27 LAB — GLUCOSE, CAPILLARY: Glucose-Capillary: 117 mg/dL — ABNORMAL HIGH (ref 70–99)

## 2022-05-27 SURGERY — PHACOEMULSIFICATION, CATARACT, WITH IOL INSERTION
Anesthesia: Monitor Anesthesia Care | Site: Eye | Laterality: Right

## 2022-05-27 MED ORDER — FENTANYL CITRATE (PF) 100 MCG/2ML IJ SOLN
INTRAMUSCULAR | Status: DC | PRN
  Administered 2022-05-27: 100 ug via INTRAVENOUS

## 2022-05-27 MED ORDER — LACTATED RINGERS IV SOLN: INTRAVENOUS | Status: DC

## 2022-05-27 MED ORDER — TETRACAINE HCL 0.5 % OP SOLN
1.0000 [drp] | OPHTHALMIC | Status: DC | PRN
  Administered 2022-05-27 (×3): 1 [drp] via OPHTHALMIC

## 2022-05-27 MED ORDER — MIDAZOLAM HCL 2 MG/2ML IJ SOLN
INTRAMUSCULAR | Status: DC | PRN
  Administered 2022-05-27: 1 mg via INTRAVENOUS

## 2022-05-27 MED ORDER — SIGHTPATH DOSE#1 NA HYALUR & NA CHOND-NA HYALUR IO KIT
PACK | INTRAOCULAR | Status: DC | PRN
  Administered 2022-05-27: 1 via OPHTHALMIC

## 2022-05-27 MED ORDER — SIGHTPATH DOSE#1 BSS IO SOLN
INTRAOCULAR | Status: DC | PRN
  Administered 2022-05-27: 84 mL via OPHTHALMIC

## 2022-05-27 MED ORDER — LIDOCAINE HCL (PF) 2 % IJ SOLN
INTRAOCULAR | Status: DC | PRN
  Administered 2022-05-27: 1 mL via INTRAOCULAR

## 2022-05-27 MED ORDER — ARMC OPHTHALMIC DILATING DROPS
1.0000 | OPHTHALMIC | Status: DC | PRN
  Administered 2022-05-27 (×3): 1 via OPHTHALMIC

## 2022-05-27 MED ORDER — SIGHTPATH DOSE#1 BSS IO SOLN
INTRAOCULAR | Status: DC | PRN
  Administered 2022-05-27: 15 mL

## 2022-05-27 MED ORDER — MOXIFLOXACIN HCL 0.5 % OP SOLN
OPHTHALMIC | Status: DC | PRN
  Administered 2022-05-27: .2 mL via OPHTHALMIC

## 2022-05-27 SURGICAL SUPPLY — 13 items
CATARACT SUITE SIGHTPATH (MISCELLANEOUS) ×1 IMPLANT
DISSECTOR HYDRO NUCLEUS 50X22 (MISCELLANEOUS) ×1 IMPLANT
FEE CATARACT SUITE SIGHTPATH (MISCELLANEOUS) ×1 IMPLANT
GLOVE SURG GAMMEX PI TX LF 7.5 (GLOVE) ×1 IMPLANT
GLOVE SURG SYN 8.5  E (GLOVE) ×1
GLOVE SURG SYN 8.5 E (GLOVE) ×1 IMPLANT
GLOVE SURG SYN 8.5 PF PI (GLOVE) ×1 IMPLANT
LENS IOL TECNIS EYHANCE 23.0 (Intraocular Lens) IMPLANT
NDL FILTER BLUNT 18X1 1/2 (NEEDLE) ×1 IMPLANT
NEEDLE FILTER BLUNT 18X1 1/2 (NEEDLE) ×1 IMPLANT
SYR 3ML LL SCALE MARK (SYRINGE) ×1 IMPLANT
SYR 5ML LL (SYRINGE) ×1 IMPLANT
WATER STERILE IRR 250ML POUR (IV SOLUTION) ×1 IMPLANT

## 2022-05-27 NOTE — Transfer of Care (Signed)
Immediate Anesthesia Transfer of Care Note  Patient: Kayla Boyle  Procedure(s) Performed: CATARACT EXTRACTION PHACO AND INTRAOCULAR LENS PLACEMENT (IOC) RIGHT DIABETIC (Right: Eye)  Patient Location: PACU  Anesthesia Type: MAC  Level of Consciousness: awake, alert  and patient cooperative  Airway and Oxygen Therapy: Patient Spontanous Breathing and Patient connected to supplemental oxygen  Post-op Assessment: Post-op Vital signs reviewed, Patient's Cardiovascular Status Stable, Respiratory Function Stable, Patent Airway and No signs of Nausea or vomiting  Post-op Vital Signs: Reviewed and stable  Complications: There were no known notable events for this encounter.

## 2022-05-27 NOTE — Anesthesia Preprocedure Evaluation (Signed)
Anesthesia Evaluation  Patient identified by MRN, date of birth, ID band Patient awake    Reviewed: Allergy & Precautions, NPO status , Patient's Chart, lab work & pertinent test results  History of Anesthesia Complications Negative for: history of anesthetic complications  Airway Mallampati: III  TM Distance: >3 FB Neck ROM: full    Dental  (+) Chipped   Pulmonary neg shortness of breath, COPD,  COPD inhaler, neg recent URI, former smoker   Pulmonary exam normal        Cardiovascular hypertension, (-) angina (-) Past MI and (-) CABG negative cardio ROS Normal cardiovascular exam     Neuro/Psych negative neurological ROS  negative psych ROS   GI/Hepatic negative GI ROS, Neg liver ROS,,,  Endo/Other  diabetesHypothyroidism    Renal/GU      Musculoskeletal   Abdominal   Peds  Hematology negative hematology ROS (+)   Anesthesia Other Findings Past Medical History: No date: Allergy No date: Arthritis     Comment:  osteo No date: Cataract No date: COPD (chronic obstructive pulmonary disease) (HCC) No date: Diabetes mellitus without complication (HCC)     Comment:  type 2 No date: Employs prosthetic leg     Comment:  Right 11/24/2014: H/O acute poliomyelitis No date: Heart murmur No date: History of chicken pox No date: Hyperlipidemia No date: Hypertension No date: Hypothyroidism No date: Shingles No date: Vertigo     Comment:  1 episode. approc 2017 No date: Wears dentures     Comment:  full upper and lower  Past Surgical History: 1963: ABDOMINAL HYSTERECTOMY No date: APPENDECTOMY No date: CARPAL TUNNEL RELEASE 10/2003: CHOLECYSTECTOMY     Comment:  laproscopic No date: ELBOW SURGERY; Bilateral 01/2010: HAND SURGERY 1975: LEG AMPUTATION BELOW KNEE; Right     Comment:  traumatic; bleow knee and club foot  BMI    Body Mass Index: 27.27 kg/m      Reproductive/Obstetrics negative OB ROS                               Anesthesia Physical Anesthesia Plan  ASA: 3  Anesthesia Plan: MAC   Post-op Pain Management:    Induction: Intravenous  PONV Risk Score and Plan: 2  Airway Management Planned: Natural Airway and Nasal Cannula  Additional Equipment:   Intra-op Plan:   Post-operative Plan:   Informed Consent: I have reviewed the patients History and Physical, chart, labs and discussed the procedure including the risks, benefits and alternatives for the proposed anesthesia with the patient or authorized representative who has indicated his/her understanding and acceptance.     Dental Advisory Given  Plan Discussed with: Anesthesiologist, CRNA and Surgeon  Anesthesia Plan Comments: (Patient consented for risks of anesthesia including but not limited to:  - adverse reactions to medications - damage to eyes, teeth, lips or other oral mucosa - nerve damage due to positioning  - sore throat or hoarseness - Damage to heart, brain, nerves, lungs, other parts of body or loss of life  Patient voiced understanding.)        Anesthesia Quick Evaluation

## 2022-05-27 NOTE — H&P (Signed)
[facility]   Primary Care Physician:  Birdie Sons, MD Ophthalmologist: Dr. Benay Pillow  Pre-Procedure History & Physical: HPI:  Kayla Boyle is a 87 y.o. female here for cataract surgery.   Past Medical History:  Diagnosis Date   Allergy    Arthritis    osteo   Cataract    COPD (chronic obstructive pulmonary disease) (HCC)    Diabetes mellitus without complication ([place])    type 2   Employs prosthetic leg    Right   H/O acute poliomyelitis 11/24/2014   Heart murmur    History of chicken pox    Hyperlipidemia    Hypertension    Hypothyroidism    Shingles    Vertigo    1 episode. approc 2017   Wears dentures    full upper and lower    Past Surgical History:  Procedure Laterality Date   ABDOMINAL HYSTERECTOMY  1963   APPENDECTOMY     CARPAL TUNNEL RELEASE     CATARACT EXTRACTION W/PHACO Left 05/13/2022   Procedure: CATARACT EXTRACTION PHACO AND INTRAOCULAR LENS PLACEMENT (IOC) LEFT DIABETIC 5.10  00:38.8;  Surgeon: Eulogio Bear, MD;  Location: [place];  Service: Ophthalmology;  Laterality: Left;  [place] Diabetic   CHOLECYSTECTOMY  10/2003   laproscopic   ELBOW SURGERY Bilateral    HAND SURGERY  01/2010   LEG AMPUTATION BELOW KNEE Right 1975   traumatic; bleow knee and club foot    Prior to Admission medications   Medication Sig Start Date End Date Taking? Authorizing Provider  albuterol (VENTOLIN HFA) 108 (90 Base) MCG/ACT inhaler Inhale 2 puffs into the lungs every 6 (six) hours as needed for wheezing or shortness of breath. 03/26/18  Yes Birdie Sons, MD  alendronate (FOSAMAX) 70 MG tablet Take 70 mg by mouth once a week. Take with a full glass of water on an empty stomach.   Yes [provider]  aspirin 81 MG tablet Take 1 tablet by mouth daily. 07/19/10  Yes [provider]  atenolol-chlorthalidone (TENORETIC) 50-25 MG tablet TAKE 1 TABLET BY MOUTH DAILY 05/10/21  Yes Birdie Sons, MD  benzonatate  (TESSALON) 100 MG capsule TAKE 1 CAPSULE BY MOUTH TWICE A DAY AS NEEDED FOR COUGH 09/25/20  Yes Birdie Sons, MD  carboxymethylcellulose (REFRESH PLUS) 0.5 % SOLN Place 1 drop into both eyes in the morning, at noon, in the evening, and at bedtime.   Yes [provider]  colestipol (COLESTID) 1 g tablet Take 1 g by mouth 2 (two) times daily.   Yes [provider]  dicyclomine (BENTYL) 10 MG capsule Take 1 capsule (10 mg total) by mouth 4 (four) times daily -  before meals and at bedtime. For colitis Patient taking differently: Take 10 mg by mouth 4 (four) times daily as needed. For colitis 11/21/20  Yes Birdie Sons, MD  diphenoxylate-atropine (LOMOTIL) 2.5-0.025 MG tablet TAKE 1 TABLET BY MOUTH 4 TIMES DAILY AS NEEDED FOR DIARRHEA OR LOOSE STOOLS 11/21/20  Yes Birdie Sons, MD  fluorometholone (FML) 0.1 % ophthalmic suspension Place 1 drop into the left eye 2 (two) times daily. 08/10/20  Yes [provider]  furosemide (LASIX) 20 MG tablet TAKE 1 TABLET BY MOUTH EVERY DAY AS NEEDED (SWELLING) 09/25/20  Yes Birdie Sons, MD  gabapentin (NEURONTIN) 300 MG capsule Take 1 capsule (300 mg total) by mouth 3 (three) times daily. Patient taking differently: Take 300 mg by mouth 3 (three)  times daily as needed. 09/25/20  Yes Birdie Sons, MD  levothyroxine (SYNTHROID) 75 MCG tablet TAKE 1 TABLET BY MOUTH EVERY DAY 07/31/20  Yes Birdie Sons, MD  lovastatin (MEVACOR) 40 MG tablet TAKE 1 TABLET BY MOUTH AT BEDTIME 02/09/20  Yes Birdie Sons, MD  meloxicam (MOBIC) 7.5 MG tablet Take 1 tablet (7.5 mg total) by mouth daily as needed (shoulder pain). 03/14/20  Yes Birdie Sons, MD  methocarbamol (ROBAXIN) 500 MG tablet TAKE TWO TABLETS BY MOUTH EVERY 6 HOURS AS NEEDED FOR MUSCLE SPASMS 09/25/20  Yes Birdie Sons, MD  montelukast (SINGULAIR) 10 MG tablet TAKE 1 TABLET BY MOUTH DAILY 12/09/20  Yes Birdie Sons, MD  omeprazole (PRILOSEC) 20 MG capsule Take 20 mg by  mouth daily.   Yes [provider]  sitaGLIPtin (JANUVIA) 50 MG tablet Take 50 mg by mouth daily.   Yes [provider]  traMADol (ULTRAM) 50 MG tablet Take 1-2 tablets (50-100 mg total) by mouth every 8 (eight) hours as needed. 09/25/20  Yes Birdie Sons, MD  triamcinolone cream (KENALOG) 0.1 % Apply 2 times daily to spot treat affected areas as needed for itch. Avoid face, groin, underarms. 01/17/20  Yes Brendolyn Patty, MD  umeclidinium-vilanterol [facility] ELLIPTA) 62.5-25 MCG/INH AEPB Inhale 1 puff into the lungs daily. 06/20/20  Yes [provider]  vitamin B-12 (CYANOCOBALAMIN) 100 MCG tablet Take 800 mcg by mouth daily.    Yes [provider]  Vitamin D, Cholecalciferol, 400 UNITS CHEW Chew 1,000 Units by mouth daily.   Yes [provider]  Accu-Chek Softclix Lancets lancets Use to check blood sugar daily for type 2 diabetes. E11.9 09/25/20   Birdie Sons, MD  Blood Glucose Monitoring Suppl (ACCU-CHEK GUIDE ME) w/Device KIT Use to check blood sugar daily for type 2 diabetes. E11.9 09/28/20   Birdie Sons, MD  glipiZIDE (GLUCOTROL XL) 2.5 MG 24 hr tablet Take 1 tablet (2.5 mg total) by mouth daily with breakfast. Patient not taking: Reported on 05/27/2022 09/22/20   Birdie Sons, MD  glucose blood (ACCU-CHEK GUIDE) test strip Use to check blood sugar daily for type 2 diabetes. E11.9 09/28/20   Birdie Sons, MD  levocetirizine (XYZAL) 5 MG tablet Take 1 by mouth every morning. Patient taking differently: daily as needed. Take 1 by mouth every morning. 01/17/20   Brendolyn Patty, MD  risedronate (ACTONEL) 35 MG tablet TAKE 1 TABLET BY MOUTH ONCE A WEEK WITH A FULL GLASS OF WATER AND DO NOT LIE BACK DOWN FOR 30 MINUTES AFTER Patient not taking: Reported on 05/07/2022 11/04/20   Birdie Sons, MD    Allergies as of 02/11/2022 - Review Complete 01/30/2021  Allergen Reaction Noted   Fosamax [alendronate sodium] Rash 11/11/2017   Bee venom  Hives 01/02/2015   Cat hair extract Other (See Comments) 12/14/2019   Codeine Rash and Swelling 11/24/2014   Penicillins Rash and Swelling 11/24/2014    Family History  Problem Relation Age of Onset   Pancreatic cancer Father    Prostate cancer Brother    Diabetes Brother        type 2   Hepatitis C Daughter    HIV/AIDS Son    Cancer Son        LYMPH NODE CANCER   Cancer Other 59       small cell cancer   Glaucoma Sister    Cancer Sister        kidney  removed    Social History   Socioeconomic History   Marital status: Widowed    Spouse name: Not on file   Number of children: 2   Years of education: 12   Highest education level: High school graduate  Occupational History   Occupation: Disabled   Occupation: retired  Tobacco Use   Smoking status: Former    Types: Cigarettes    Quit date: 10/06/2001    Years since quitting: 20.6   Smokeless tobacco: Never  Vaping Use   Vaping Use: Never used  Substance and Sexual Activity   Alcohol use: No    Alcohol/week: 0.0 standard drinks of alcohol   Drug use: No   Sexual activity: Not on file  Other Topics Concern   Not on file  Social History Narrative   Not on file   Social Determinants of Health   Financial Resource Strain: Medium Risk (08/31/2020)   Overall Financial Resource Strain (CARDIA)    Difficulty of Paying Living Expenses: Somewhat hard  Food Insecurity: No Food Insecurity (08/31/2020)   Hunger Vital Sign    Worried About Running Out of Food in the Last Year: Never true    [place] in the Last Year: Never true  Transportation Needs: No Transportation Needs (08/31/2020)   PRAPARE - Hydrologist (Medical): No    Lack of Transportation (Non-Medical): No  Physical Activity: Inactive (08/31/2020)   Exercise Vital Sign    Days of Exercise per Week: 0 days    Minutes of Exercise per Session: 0 min  Stress: No Stress Concern Present (08/31/2020)   [place]    Feeling of Stress : Not at all  Social Connections: Moderately Isolated (08/31/2020)   Social Connection and Isolation Panel [NHANES]    Frequency of Communication with Friends and Family: More than three times a week    Frequency of Social Gatherings with Friends and Family: More than three times a week    Attends Religious Services: More than 4 times per year    Active Member of [employer] or Organizations: No    Attends Archivist Meetings: Never    Marital Status: Widowed  Intimate Partner Violence: Not At Risk (08/31/2020)   Humiliation, Afraid, Rape, and Kick questionnaire    Fear of Current or Ex-Partner: No    Emotionally Abused: No    Physically Abused: No    Sexually Abused: No    Review of Systems: See HPI, otherwise negative ROS  Physical Exam: BP (!) 148/64   Pulse 62   Temp 97.7 F (36.5 C) (Temporal)   Resp (!) 21   Ht 4' 11" (1.499 m)   Wt 62.2 kg   SpO2 97%   BMI 27.71 kg/m  General:   Alert, cooperative in NAD Head:  Normocephalic and atraumatic. Respiratory:  Normal work of breathing. Cardiovascular:  RRR  Impression/Plan: Kayla Boyle is here for cataract surgery.  Risks, benefits, limitations, and alternatives regarding cataract surgery have been reviewed with the patient.  Questions have been answered.  All parties agreeable.   Benay Pillow, MD  05/27/2022, 7:21 AM

## 2022-05-27 NOTE — Anesthesia Postprocedure Evaluation (Signed)
Anesthesia Post Note  Patient: Kayla Boyle  Procedure(s) Performed: CATARACT EXTRACTION PHACO AND INTRAOCULAR LENS PLACEMENT (IOC) RIGHT DIABETIC (Right: Eye)  Patient location during evaluation: PACU Anesthesia Type: MAC Level of consciousness: awake and alert Pain management: pain level controlled Vital Signs Assessment: post-procedure vital signs reviewed and stable Respiratory status: spontaneous breathing, nonlabored ventilation, respiratory function stable and patient connected to nasal cannula oxygen Cardiovascular status: stable and blood pressure returned to baseline Postop Assessment: no apparent nausea or vomiting Anesthetic complications: no   There were no known notable events for this encounter.   Last Vitals:  Vitals:   05/27/22 0648  BP: (!) 148/64  Pulse: 62  Resp: (!) 21  Temp: 36.5 C  SpO2: 97%    Last Pain:  Vitals:   05/27/22 0648  TempSrc: Temporal  PainSc: 0-No pain                 Ilene Qua

## 2022-05-27 NOTE — Op Note (Signed)
OPERATIVE NOTE  Kayla Boyle [id number] 05/27/2022   PREOPERATIVE DIAGNOSIS:  Nuclear sclerotic cataract right eye.  H25.11   POSTOPERATIVE DIAGNOSIS:    Nuclear sclerotic cataract right eye.     PROCEDURE:  Phacoemusification with posterior chamber intraocular lens placement of the right eye   LENS:  * No implants in log *     Procedure(s) with comments: CATARACT EXTRACTION PHACO AND INTRAOCULAR LENS PLACEMENT (IOC) RIGHT DIABETIC (Right) - 3.59 0:34.1  DIB00 +23.0   ULTRASOUND TIME: 0 minutes 34 seconds.  CDE 3.59   SURGEON:  Willey Blade, MD, MPH  ANESTHESIOLOGIST: Anesthesiologist: Louie Boston, MD CRNA: Maryla Morrow., CRNA   ANESTHESIA:  Topical with tetracaine drops augmented with 1% preservative-free intracameral lidocaine.  ESTIMATED BLOOD LOSS: less than 1 mL.   COMPLICATIONS:  None.   DESCRIPTION OF PROCEDURE:  The patient was identified in the holding room and transported to the operating room and placed in the supine position under the operating microscope.  The right eye was identified as the operative eye and it was prepped and draped in the usual sterile ophthalmic fashion.   A 1.0 millimeter clear-corneal paracentesis was made at the 10:30 position. 0.5 ml of preservative-free 1% lidocaine with epinephrine was injected into the anterior chamber.  The anterior chamber was filled with viscoelastic.  A 2.4 millimeter keratome was used to make a near-clear corneal incision at the 8:00 position.  A curvilinear capsulorrhexis was made with a cystotome and capsulorrhexis forceps.  Balanced salt solution was used to hydrodissect and hydrodelineate the nucleus.   Phacoemulsification was then used in stop and chop fashion to remove the lens nucleus and epinucleus.  The remaining cortex was then removed using the irrigation and aspiration handpiece. Viscoelastic was then placed into the capsular bag to distend it for lens placement.  A lens was then injected into  the capsular bag.  The remaining viscoelastic was aspirated.   Wounds were hydrated with balanced salt solution.  The anterior chamber was inflated to a physiologic pressure with balanced salt solution.   Intracameral vigamox 0.1 mL undiluted was injected into the eye and a drop placed onto the ocular surface.  No wound leaks were noted.    Combigan drop was placed on the right eye.   The patient was taken to the recovery room in stable condition without complications of anesthesia or surgery  Willey Blade 05/27/2022, 7:53 AM

## 2022-05-28 ENCOUNTER — Encounter: Payer: Self-pay | Admitting: Ophthalmology
